# Patient Record
Sex: Female | Born: 1940 | Race: White | Hispanic: No | Marital: Married | Smoking: Never smoker
Health system: Southern US, Community
[De-identification: ages and names within clinical notes are randomized; demographics above are authoritative.]

## PROBLEM LIST (undated history)

## (undated) DIAGNOSIS — C50919 Malignant neoplasm of unspecified site of unspecified female breast: Secondary | ICD-10-CM

## (undated) DIAGNOSIS — I10 Essential (primary) hypertension: Secondary | ICD-10-CM

## (undated) DIAGNOSIS — E119 Type 2 diabetes mellitus without complications: Secondary | ICD-10-CM

## (undated) DIAGNOSIS — E785 Hyperlipidemia, unspecified: Secondary | ICD-10-CM

## (undated) HISTORY — DX: Type 2 diabetes mellitus without complications: E11.9

## (undated) HISTORY — DX: Essential (primary) hypertension: I10

## (undated) HISTORY — PX: MASTECTOMY: SHX3

## (undated) HISTORY — DX: Hyperlipidemia, unspecified: E78.5

## (undated) HISTORY — PX: TOTAL KNEE ARTHROPLASTY: SHX125

## (undated) HISTORY — DX: Malignant neoplasm of unspecified site of unspecified female breast: C50.919

---

## 1999-10-24 ENCOUNTER — Other Ambulatory Visit: Admission: RE | Admit: 1999-10-24 | Discharge: 1999-10-24 | Payer: Self-pay | Admitting: Family Medicine

## 2001-03-31 ENCOUNTER — Other Ambulatory Visit: Admission: RE | Admit: 2001-03-31 | Discharge: 2001-03-31 | Payer: Self-pay | Admitting: Family Medicine

## 2002-05-04 ENCOUNTER — Encounter: Admission: RE | Admit: 2002-05-04 | Discharge: 2002-05-04 | Payer: Self-pay | Admitting: Orthopedic Surgery

## 2002-05-04 ENCOUNTER — Encounter: Payer: Self-pay | Admitting: Orthopedic Surgery

## 2002-10-01 ENCOUNTER — Encounter: Admission: RE | Admit: 2002-10-01 | Discharge: 2002-10-01 | Payer: Self-pay | Admitting: Family Medicine

## 2002-10-01 ENCOUNTER — Encounter: Payer: Self-pay | Admitting: Family Medicine

## 2008-12-19 ENCOUNTER — Encounter: Payer: Self-pay | Admitting: Cardiology

## 2009-02-01 ENCOUNTER — Ambulatory Visit: Payer: Self-pay | Admitting: Cardiology

## 2009-03-14 ENCOUNTER — Encounter: Payer: Self-pay | Admitting: Cardiology

## 2009-10-03 ENCOUNTER — Encounter
Admission: RE | Admit: 2009-10-03 | Discharge: 2010-01-01 | Payer: Self-pay | Admitting: Physical Medicine and Rehabilitation

## 2010-02-19 DIAGNOSIS — I10 Essential (primary) hypertension: Secondary | ICD-10-CM

## 2010-02-19 DIAGNOSIS — E785 Hyperlipidemia, unspecified: Secondary | ICD-10-CM | POA: Insufficient documentation

## 2010-02-19 DIAGNOSIS — I152 Hypertension secondary to endocrine disorders: Secondary | ICD-10-CM | POA: Insufficient documentation

## 2010-02-19 DIAGNOSIS — E1159 Type 2 diabetes mellitus with other circulatory complications: Secondary | ICD-10-CM | POA: Insufficient documentation

## 2010-02-21 ENCOUNTER — Ambulatory Visit: Payer: Self-pay | Admitting: Cardiology

## 2010-03-06 ENCOUNTER — Encounter: Payer: Self-pay | Admitting: Cardiology

## 2010-03-09 ENCOUNTER — Encounter: Payer: Self-pay | Admitting: Cardiology

## 2010-10-16 NOTE — Assessment & Plan Note (Signed)
Summary: [facility]   Visit Type:  Follow-up Referring Provider:  Dr. Kenna Gilbert Primary Provider:  Belva Agee, NP  CC:  Jeanette Daniels op.  History of Present Illness: The patient presents for preoperative evaluation. She is due to have left knee replacement. She has a history of cardiovascular risk factors with high blood pressure and dyslipidemia. I saw her last year preoperatively prior to right knee replacement. She did well with this procedure. She has had no new symptoms since that time. Despite her knee problems she is active working daily and can do such things as climbing stairs and vacuuming. With this she does not get chest pressure, neck or arm discomfort. She does not have any shortness of breath and denies any PND or orthopnea. She has no palpitations, presyncope or syncope.  Current Medications (verified): 1)  Aromasin 25 Mg Tabs (Exemestane) .Marland Kitchen.. 1 By Mouth Daily 2)  Lipitor 20 Mg Tabs (Atorvastatin Calcium) .Marland Kitchen.. 1 By Mouth Dialy 3)  Metoprolol Tartrate 100 Mg Tabs (Metoprolol Tartrate) .Marland Kitchen.. 1 By Mouth Two Times A Day 4)  Sertraline Hcl 100 Mg Tabs (Sertraline Hcl) .Marland Kitchen.. 1 1/2 By Mouth Dialy 5)  Vitamin D 2000 Unit Caps (Cholecalciferol) .... 2 By Mouth Daily 6)  Zolpidem Tartrate 10 Mg Tabs (Zolpidem Tartrate) .Marland Kitchen.. 1 By Mouth  At Bedtime 7)  Hydrocodone-Acetaminophen 5-325 Mg Tabs (Hydrocodone-Acetaminophen) .Marland Kitchen.. 1 By Mouth As Needed 8)  Eql Stool Softener 100 Mg Caps (Docusate Sodium) .Marland Kitchen.. 1 By Mouth Daily  Allergies (verified): 1)  ! Celebrex  Past History:     Past Medical History: 1. Breast cancer status post chemotherapy, radiation and surgery. 2. Hyperlipidemia x16years. 3. Hypertension x16 years.  Past Surgical History:  Left mastectomy.   Right total knee replacement  Family History:  Noncontributory for early coronary artery disease.  Her father died at 4 of Alzheimer's.   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 She is married and has 2 children.  She never smoked cigarettes and does not drink alcohol.   Review of Systems       As stated in the HPI and negative for all other systems.   Vital Signs:  Patient profile:   70 year old female Height:      66 inches Weight:      220 pounds BMI:     35.64 Pulse rate:   65 / minute Resp:     16 per minute BP sitting:   144 / 78  (right arm)  Vitals Entered By: Marrion Coy, CNA (February 21, 2010 1:08 PM)  Physical Exam  General:  Well developed, well nourished, in no acute distress. Head:  normocephalic and atraumatic Eyes:  PERRLA/EOM intact; conjunctiva and lids normal. Neck:  Neck supple, no JVD. No masses, thyromegaly or abnormal cervical nodes. Chest Wall:  no deformities or breast masses noted Lungs:  Clear bilaterally to auscultation and percussion. Abdomen:  Bowel sounds positive; abdomen soft and non-tender without masses, organomegaly, or hernias noted. No hepatosplenomegaly. Msk:  Back normal, normal gait. Muscle strength and tone normal. Extremities:  No clubbing or cyanosis. Neurologic:  Alert and oriented x 3. Skin:  Intact without lesions or rashes. Cervical Nodes:  no significant adenopathy Axillary Nodes:  no significant adenopathy Inguinal Nodes:  no significant adenopathy Psych:  Normal affect.   Detailed Cardiovascular Exam  Neck    Carotids: Carotids full and equal bilaterally without bruits.      Neck Veins: Normal,  no JVD.    Heart    Inspection: no deformities or lifts noted.      Palpation: normal PMI with no thrills palpable.      Auscultation: regular rate and rhythm, S1, S2 without murmurs, rubs, gallops, or clicks.    Vascular    Abdominal Aorta: no palpable masses, pulsations, or audible bruits.      Femoral Pulses: normal femoral pulses bilaterally.      Pedal Pulses: normal pedal pulses bilaterally.      Radial Pulses: normal radial pulses bilaterally.      Peripheral Circulation:  no clubbing, cyanosis, or edema noted with normal capillary refill.     EKG  Procedure date:  02/21/2010  Findings:      Sinus rhythm, rate 65, axis rightward, nonspecific inferior T-wave inversion in lead V3 only  Impression & Recommendations:  Problem # 1:  PREOPERATIVE EXAMINATION (ICD-V72.84) The patient is at acceptable risk for the planned surgery based on ACC/AHA guidelines. She has no high risk features, a good functional level (greater than 5 METS) and is going for a surgery that is moderate risk at most from a cardiovascular standpoint.  Problem # 2:  HYPERTENSION (ICD-401.9) Her blood pressure is very slightly elevated and this is unusual. I reviewed recent blood pressures at another office visits and would suggest no change to her regimen is necessary.  Problem # 3:  HYPERLIPIDEMIA (ICD-272.4) I reviewed her lipid profile and she has an excellent ratio. She'll continue on the meds as listed. Her updated medication list for this problem includes:    Lipitor 20 Mg Tabs (Atorvastatin calcium) .Marland Kitchen... 1 by mouth dialy

## 2010-10-16 NOTE — Letter (Signed)
Summary: [facility]   [facility]   Imported By: Roderic Ovens 03/22/2010 13:00:49  _____________________________________________________________________  External Attachment:    Type:   Image     Comment:   External Document

## 2010-10-16 NOTE — Letter (Signed)
[facility]   Imported By: Marylou Mccoy 03/23/2010 12:42:05  _____________________________________________________________________  External Attachment:    Type:   Image     Comment:   External Document

## 2010-10-16 NOTE — Letter (Signed)
Summary: [facility]  [facility]   Imported By: Debby Freiberg 03/14/2010 14:46:24  _____________________________________________________________________  External Attachment:    Type:   Image     Comment:   External Document

## 2011-01-29 NOTE — Assessment & Plan Note (Signed)
[facility]                            CARDIOLOGY OFFICE NOTE   NAME:Figley, ARACELIE ADDIS                         MRN:          [id number]  DATE:02/01/2009                            DOB:          1940/11/06    PRIMARY CARE PHYSICIAN:  Belva Agee, NP   REASON FOR PRESENTATION:  Preoperative evaluation of the patient with  multiple cardiovascular risk factors.   HISTORY OF PRESENT ILLNESS:  The patient is a lovely 70 year old white  female who is to have right knee surgery.  She has no prior cardiac  history, though she does have cardiovascular risk factors.  She did have  a negative exercise treadmill test in 2001.  She has not had any  cardiovascular symptoms.  She is not able to exercise as much she used  to because of knee pain.  However, she goes up and down stairs.  She  pushes the vacuum (greater than 5 METS).  With this level of activity,  she denies any chest discomfort, neck or arm discomfort.  She has no  palpitations, presyncope, or syncope.  She has no PND or orthopnea.   PAST MEDICAL HISTORY:  1. Breast cancer status post chemotherapy, radiation and surgery.  2. Hyperlipidemia x15 years.  3. Hypertension x15 years.   PAST SURGICAL HISTORY:  Left mastectomy.   ALLERGIES:  None.   MEDICATIONS:  1. Toprol 100 mg b.i.d.  2. Lipitor 20 mg daily.  3. Mobic 10 mg daily.  4. Aromasin 25 mg daily.  5. Zoloft 25 mg daily.  6. Vitamin D.  7. Ambien 10 mg at bedtime.   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  She is married and has 2 children.  She never smoked  cigarettes and does not drink alcohol.   FAMILY HISTORY:  Noncontributory for early coronary artery disease.  Her  father died at 55 of Alzheimer's.   REVIEW OF SYSTEMS:  As stated in the HPI, positive for joint pains, mild  lower extremity edema.  Negative for all other systems.   PHYSICAL EXAMINATION:  GENERAL:  The patient is  pleasant and in no  distress.  VITAL SIGNS:  Blood pressure 120/72, heart rate 71 and regular, weight  220 pounds, body mass index 36.  HEENT:  Eyelids unremarkable.  Pupils equal, round, reactive to light.  Fundi not visualized.  Oral mucous unremarkable.  NECK:  No jugular venous distention at 45 degrees.  Carotid upstroke  brisk and symmetric.  No bruits, no thyromegaly.  LYMPHATICS:  No cervical, axillary, or inguinal adenopathy.  LUNGS:  Clear to auscultation bilaterally.  BACK:  No costovertebral angle tenderness.  CHEST:  Unremarkable.  HEART:  PMI not displaced or sustained.  S1 and S2 within normal limits.  No S3, no S4.  No clicks, no rubs, no murmurs.  ABDOMEN:  Flat, positive bowel sounds.  Normal in frequency and pitch.  No bruits, no rebound, no guarding.  No midline pulsatile mass.  No  hepatomegaly, no splenomegaly.  SKIN:  No rashes, no nodules.  EXTREMITIES:  Pulses 2+ throughout.  No edema, no cyanosis, no clubbing.  NEURO:  Oriented to person, place, and time.  Cranial nerves II through  XII are grossly intact, motor grossly intact.   EKG, sinus bradycardia, rate is 50, axis within normal limits, intervals  within normal limits, RSR prime in V1 and V2, no acute ST-wave change.   ASSESSMENT AND PLAN:  1. Preoperative clearance.  The patient has cardiovascular risk      factors.  However, she has no symptoms.  There are no high-risk      features according to the ACC/AHA guidelines.  She has a high      functional level.  The surgery that is planned has only moderate      risk from a cardiovascular standpoint.  Given this, no further      cardiovascular testing is suggested.  The patient can proceed with      surgery with the medications as listed.  2. Hypertension.  Blood pressure is well controlled and she will      continue the meds as listed.  3. Dyslipidemia.  She says this is well controlled.  I did review      previous labs with an LDL of 107 and HDL of  43.  She will remain on      the meds as listed.  4. Obesity.  She understands the need to lose weight with diet and      exercise.  Her body mass index does put her in the obese range.  5. Followup will be in this clinic as needed.     Rollene Rotunda, MD, [facility]  Electronically Signed    JH/MedQ  DD: 02/01/2009  DT: 02/02/2009  Job #: [id number]   cc:   Belva Agee, NP

## 2011-04-23 ENCOUNTER — Encounter: Payer: Self-pay | Admitting: Cardiology

## 2011-10-29 DIAGNOSIS — N281 Cyst of kidney, acquired: Secondary | ICD-10-CM | POA: Diagnosis not present

## 2011-10-29 DIAGNOSIS — IMO0001 Reserved for inherently not codable concepts without codable children: Secondary | ICD-10-CM | POA: Diagnosis not present

## 2011-10-29 DIAGNOSIS — Z96659 Presence of unspecified artificial knee joint: Secondary | ICD-10-CM | POA: Diagnosis not present

## 2011-10-29 DIAGNOSIS — G4700 Insomnia, unspecified: Secondary | ICD-10-CM | POA: Diagnosis not present

## 2011-10-29 DIAGNOSIS — C50919 Malignant neoplasm of unspecified site of unspecified female breast: Secondary | ICD-10-CM | POA: Diagnosis not present

## 2011-10-29 DIAGNOSIS — M1990 Unspecified osteoarthritis, unspecified site: Secondary | ICD-10-CM | POA: Diagnosis not present

## 2011-10-29 DIAGNOSIS — Z79899 Other long term (current) drug therapy: Secondary | ICD-10-CM | POA: Diagnosis not present

## 2011-10-29 DIAGNOSIS — E785 Hyperlipidemia, unspecified: Secondary | ICD-10-CM | POA: Diagnosis not present

## 2011-10-29 DIAGNOSIS — F329 Major depressive disorder, single episode, unspecified: Secondary | ICD-10-CM | POA: Diagnosis not present

## 2011-10-29 DIAGNOSIS — I10 Essential (primary) hypertension: Secondary | ICD-10-CM | POA: Diagnosis not present

## 2011-10-29 DIAGNOSIS — K5730 Diverticulosis of large intestine without perforation or abscess without bleeding: Secondary | ICD-10-CM | POA: Diagnosis not present

## 2011-12-09 DIAGNOSIS — Z09 Encounter for follow-up examination after completed treatment for conditions other than malignant neoplasm: Secondary | ICD-10-CM | POA: Diagnosis not present

## 2011-12-09 DIAGNOSIS — G4700 Insomnia, unspecified: Secondary | ICD-10-CM | POA: Diagnosis not present

## 2011-12-09 DIAGNOSIS — Z79811 Long term (current) use of aromatase inhibitors: Secondary | ICD-10-CM | POA: Diagnosis not present

## 2011-12-09 DIAGNOSIS — I10 Essential (primary) hypertension: Secondary | ICD-10-CM | POA: Diagnosis not present

## 2011-12-09 DIAGNOSIS — Z79899 Other long term (current) drug therapy: Secondary | ICD-10-CM | POA: Diagnosis not present

## 2011-12-09 DIAGNOSIS — M1990 Unspecified osteoarthritis, unspecified site: Secondary | ICD-10-CM | POA: Diagnosis not present

## 2011-12-09 DIAGNOSIS — E785 Hyperlipidemia, unspecified: Secondary | ICD-10-CM | POA: Diagnosis not present

## 2011-12-09 DIAGNOSIS — F329 Major depressive disorder, single episode, unspecified: Secondary | ICD-10-CM | POA: Diagnosis not present

## 2011-12-09 DIAGNOSIS — N281 Cyst of kidney, acquired: Secondary | ICD-10-CM | POA: Diagnosis not present

## 2011-12-09 DIAGNOSIS — Z853 Personal history of malignant neoplasm of breast: Secondary | ICD-10-CM | POA: Diagnosis not present

## 2011-12-09 DIAGNOSIS — IMO0001 Reserved for inherently not codable concepts without codable children: Secondary | ICD-10-CM | POA: Diagnosis not present

## 2011-12-09 DIAGNOSIS — Z96659 Presence of unspecified artificial knee joint: Secondary | ICD-10-CM | POA: Diagnosis not present

## 2011-12-09 DIAGNOSIS — K5732 Diverticulitis of large intestine without perforation or abscess without bleeding: Secondary | ICD-10-CM | POA: Diagnosis not present

## 2011-12-16 DIAGNOSIS — I10 Essential (primary) hypertension: Secondary | ICD-10-CM | POA: Diagnosis not present

## 2011-12-16 DIAGNOSIS — E785 Hyperlipidemia, unspecified: Secondary | ICD-10-CM | POA: Diagnosis not present

## 2012-01-07 DIAGNOSIS — IMO0001 Reserved for inherently not codable concepts without codable children: Secondary | ICD-10-CM | POA: Diagnosis not present

## 2012-01-20 DIAGNOSIS — Z452 Encounter for adjustment and management of vascular access device: Secondary | ICD-10-CM | POA: Diagnosis not present

## 2012-01-20 DIAGNOSIS — Z853 Personal history of malignant neoplasm of breast: Secondary | ICD-10-CM | POA: Diagnosis not present

## 2012-02-27 DIAGNOSIS — Z471 Aftercare following joint replacement surgery: Secondary | ICD-10-CM | POA: Diagnosis not present

## 2012-02-27 DIAGNOSIS — Z96659 Presence of unspecified artificial knee joint: Secondary | ICD-10-CM | POA: Diagnosis not present

## 2012-03-03 DIAGNOSIS — Z803 Family history of malignant neoplasm of breast: Secondary | ICD-10-CM | POA: Diagnosis not present

## 2012-03-03 DIAGNOSIS — Z09 Encounter for follow-up examination after completed treatment for conditions other than malignant neoplasm: Secondary | ICD-10-CM | POA: Diagnosis not present

## 2012-03-03 DIAGNOSIS — Z853 Personal history of malignant neoplasm of breast: Secondary | ICD-10-CM | POA: Diagnosis not present

## 2012-03-03 DIAGNOSIS — M1990 Unspecified osteoarthritis, unspecified site: Secondary | ICD-10-CM | POA: Diagnosis not present

## 2012-03-03 DIAGNOSIS — K5730 Diverticulosis of large intestine without perforation or abscess without bleeding: Secondary | ICD-10-CM | POA: Diagnosis not present

## 2012-03-03 DIAGNOSIS — Z170 Estrogen receptor positive status [ER+]: Secondary | ICD-10-CM | POA: Diagnosis not present

## 2012-03-03 DIAGNOSIS — Z79811 Long term (current) use of aromatase inhibitors: Secondary | ICD-10-CM | POA: Diagnosis not present

## 2012-03-03 DIAGNOSIS — E785 Hyperlipidemia, unspecified: Secondary | ICD-10-CM | POA: Diagnosis not present

## 2012-03-03 DIAGNOSIS — G4700 Insomnia, unspecified: Secondary | ICD-10-CM | POA: Diagnosis not present

## 2012-03-03 DIAGNOSIS — IMO0001 Reserved for inherently not codable concepts without codable children: Secondary | ICD-10-CM | POA: Diagnosis not present

## 2012-03-03 DIAGNOSIS — N281 Cyst of kidney, acquired: Secondary | ICD-10-CM | POA: Diagnosis not present

## 2012-03-03 DIAGNOSIS — I10 Essential (primary) hypertension: Secondary | ICD-10-CM | POA: Diagnosis not present

## 2012-03-03 DIAGNOSIS — F329 Major depressive disorder, single episode, unspecified: Secondary | ICD-10-CM | POA: Diagnosis not present

## 2012-03-03 DIAGNOSIS — Z9010 Acquired absence of unspecified breast and nipple: Secondary | ICD-10-CM | POA: Diagnosis not present

## 2012-04-07 DIAGNOSIS — I10 Essential (primary) hypertension: Secondary | ICD-10-CM | POA: Diagnosis not present

## 2012-04-07 DIAGNOSIS — E119 Type 2 diabetes mellitus without complications: Secondary | ICD-10-CM | POA: Diagnosis not present

## 2012-04-21 DIAGNOSIS — Z09 Encounter for follow-up examination after completed treatment for conditions other than malignant neoplasm: Secondary | ICD-10-CM | POA: Diagnosis not present

## 2012-04-21 DIAGNOSIS — Z79811 Long term (current) use of aromatase inhibitors: Secondary | ICD-10-CM | POA: Diagnosis not present

## 2012-04-21 DIAGNOSIS — E785 Hyperlipidemia, unspecified: Secondary | ICD-10-CM | POA: Diagnosis not present

## 2012-04-21 DIAGNOSIS — G4700 Insomnia, unspecified: Secondary | ICD-10-CM | POA: Diagnosis not present

## 2012-04-21 DIAGNOSIS — Z853 Personal history of malignant neoplasm of breast: Secondary | ICD-10-CM | POA: Diagnosis not present

## 2012-04-21 DIAGNOSIS — I10 Essential (primary) hypertension: Secondary | ICD-10-CM | POA: Diagnosis not present

## 2012-04-21 DIAGNOSIS — F329 Major depressive disorder, single episode, unspecified: Secondary | ICD-10-CM | POA: Diagnosis not present

## 2012-06-02 DIAGNOSIS — E785 Hyperlipidemia, unspecified: Secondary | ICD-10-CM | POA: Diagnosis not present

## 2012-06-02 DIAGNOSIS — G4700 Insomnia, unspecified: Secondary | ICD-10-CM | POA: Diagnosis not present

## 2012-06-02 DIAGNOSIS — Z803 Family history of malignant neoplasm of breast: Secondary | ICD-10-CM | POA: Diagnosis not present

## 2012-06-02 DIAGNOSIS — Z79811 Long term (current) use of aromatase inhibitors: Secondary | ICD-10-CM | POA: Diagnosis not present

## 2012-06-02 DIAGNOSIS — Z853 Personal history of malignant neoplasm of breast: Secondary | ICD-10-CM | POA: Diagnosis not present

## 2012-06-02 DIAGNOSIS — N281 Cyst of kidney, acquired: Secondary | ICD-10-CM | POA: Diagnosis not present

## 2012-06-02 DIAGNOSIS — Z09 Encounter for follow-up examination after completed treatment for conditions other than malignant neoplasm: Secondary | ICD-10-CM | POA: Diagnosis not present

## 2012-06-02 DIAGNOSIS — K5730 Diverticulosis of large intestine without perforation or abscess without bleeding: Secondary | ICD-10-CM | POA: Diagnosis not present

## 2012-06-02 DIAGNOSIS — I10 Essential (primary) hypertension: Secondary | ICD-10-CM | POA: Diagnosis not present

## 2012-06-02 DIAGNOSIS — IMO0001 Reserved for inherently not codable concepts without codable children: Secondary | ICD-10-CM | POA: Diagnosis not present

## 2012-06-02 DIAGNOSIS — M1990 Unspecified osteoarthritis, unspecified site: Secondary | ICD-10-CM | POA: Diagnosis not present

## 2012-07-07 DIAGNOSIS — Z79811 Long term (current) use of aromatase inhibitors: Secondary | ICD-10-CM | POA: Diagnosis not present

## 2012-07-07 DIAGNOSIS — E785 Hyperlipidemia, unspecified: Secondary | ICD-10-CM | POA: Diagnosis not present

## 2012-07-07 DIAGNOSIS — I10 Essential (primary) hypertension: Secondary | ICD-10-CM | POA: Diagnosis not present

## 2012-07-07 DIAGNOSIS — IMO0001 Reserved for inherently not codable concepts without codable children: Secondary | ICD-10-CM | POA: Diagnosis not present

## 2012-07-07 DIAGNOSIS — K5730 Diverticulosis of large intestine without perforation or abscess without bleeding: Secondary | ICD-10-CM | POA: Diagnosis not present

## 2012-07-07 DIAGNOSIS — M1990 Unspecified osteoarthritis, unspecified site: Secondary | ICD-10-CM | POA: Diagnosis not present

## 2012-07-07 DIAGNOSIS — Z853 Personal history of malignant neoplasm of breast: Secondary | ICD-10-CM | POA: Diagnosis not present

## 2012-07-07 DIAGNOSIS — Z9010 Acquired absence of unspecified breast and nipple: Secondary | ICD-10-CM | POA: Diagnosis not present

## 2012-07-07 DIAGNOSIS — Z09 Encounter for follow-up examination after completed treatment for conditions other than malignant neoplasm: Secondary | ICD-10-CM | POA: Diagnosis not present

## 2012-07-09 DIAGNOSIS — E119 Type 2 diabetes mellitus without complications: Secondary | ICD-10-CM | POA: Diagnosis not present

## 2012-10-22 DIAGNOSIS — Z9010 Acquired absence of unspecified breast and nipple: Secondary | ICD-10-CM | POA: Diagnosis not present

## 2012-10-22 DIAGNOSIS — Z79811 Long term (current) use of aromatase inhibitors: Secondary | ICD-10-CM | POA: Diagnosis not present

## 2012-10-22 DIAGNOSIS — N281 Cyst of kidney, acquired: Secondary | ICD-10-CM | POA: Diagnosis not present

## 2012-10-22 DIAGNOSIS — Z452 Encounter for adjustment and management of vascular access device: Secondary | ICD-10-CM | POA: Diagnosis not present

## 2012-10-22 DIAGNOSIS — Z9221 Personal history of antineoplastic chemotherapy: Secondary | ICD-10-CM | POA: Diagnosis not present

## 2012-10-22 DIAGNOSIS — Z09 Encounter for follow-up examination after completed treatment for conditions other than malignant neoplasm: Secondary | ICD-10-CM | POA: Diagnosis not present

## 2012-10-22 DIAGNOSIS — Z923 Personal history of irradiation: Secondary | ICD-10-CM | POA: Diagnosis not present

## 2012-10-22 DIAGNOSIS — G4700 Insomnia, unspecified: Secondary | ICD-10-CM | POA: Diagnosis not present

## 2012-10-22 DIAGNOSIS — Z853 Personal history of malignant neoplasm of breast: Secondary | ICD-10-CM | POA: Diagnosis not present

## 2012-12-03 DIAGNOSIS — Z853 Personal history of malignant neoplasm of breast: Secondary | ICD-10-CM | POA: Diagnosis not present

## 2012-12-03 DIAGNOSIS — I10 Essential (primary) hypertension: Secondary | ICD-10-CM | POA: Diagnosis not present

## 2012-12-03 DIAGNOSIS — M1990 Unspecified osteoarthritis, unspecified site: Secondary | ICD-10-CM | POA: Diagnosis not present

## 2012-12-03 DIAGNOSIS — Z79811 Long term (current) use of aromatase inhibitors: Secondary | ICD-10-CM | POA: Diagnosis not present

## 2012-12-03 DIAGNOSIS — IMO0001 Reserved for inherently not codable concepts without codable children: Secondary | ICD-10-CM | POA: Diagnosis not present

## 2012-12-03 DIAGNOSIS — K5730 Diverticulosis of large intestine without perforation or abscess without bleeding: Secondary | ICD-10-CM | POA: Diagnosis not present

## 2012-12-03 DIAGNOSIS — E785 Hyperlipidemia, unspecified: Secondary | ICD-10-CM | POA: Diagnosis not present

## 2012-12-03 DIAGNOSIS — Z09 Encounter for follow-up examination after completed treatment for conditions other than malignant neoplasm: Secondary | ICD-10-CM | POA: Diagnosis not present

## 2012-12-03 DIAGNOSIS — G4700 Insomnia, unspecified: Secondary | ICD-10-CM | POA: Diagnosis not present

## 2012-12-20 ENCOUNTER — Other Ambulatory Visit: Payer: Self-pay | Admitting: Physician Assistant

## 2013-01-13 DIAGNOSIS — Z79811 Long term (current) use of aromatase inhibitors: Secondary | ICD-10-CM | POA: Diagnosis not present

## 2013-01-13 DIAGNOSIS — C50919 Malignant neoplasm of unspecified site of unspecified female breast: Secondary | ICD-10-CM | POA: Diagnosis not present

## 2013-01-13 DIAGNOSIS — Z452 Encounter for adjustment and management of vascular access device: Secondary | ICD-10-CM | POA: Diagnosis not present

## 2013-02-24 DIAGNOSIS — Z803 Family history of malignant neoplasm of breast: Secondary | ICD-10-CM | POA: Diagnosis not present

## 2013-02-24 DIAGNOSIS — Z9010 Acquired absence of unspecified breast and nipple: Secondary | ICD-10-CM | POA: Diagnosis not present

## 2013-02-24 DIAGNOSIS — Z452 Encounter for adjustment and management of vascular access device: Secondary | ICD-10-CM | POA: Diagnosis not present

## 2013-02-24 DIAGNOSIS — Z170 Estrogen receptor positive status [ER+]: Secondary | ICD-10-CM | POA: Diagnosis not present

## 2013-02-24 DIAGNOSIS — Z9221 Personal history of antineoplastic chemotherapy: Secondary | ICD-10-CM | POA: Diagnosis not present

## 2013-02-24 DIAGNOSIS — Z79899 Other long term (current) drug therapy: Secondary | ICD-10-CM | POA: Diagnosis not present

## 2013-02-24 DIAGNOSIS — C50919 Malignant neoplasm of unspecified site of unspecified female breast: Secondary | ICD-10-CM | POA: Diagnosis not present

## 2013-02-24 DIAGNOSIS — Z79811 Long term (current) use of aromatase inhibitors: Secondary | ICD-10-CM | POA: Diagnosis not present

## 2013-02-24 DIAGNOSIS — Z923 Personal history of irradiation: Secondary | ICD-10-CM | POA: Diagnosis not present

## 2013-03-26 ENCOUNTER — Ambulatory Visit (INDEPENDENT_AMBULATORY_CARE_PROVIDER_SITE_OTHER): Payer: BC Managed Care – PPO | Admitting: Family Medicine

## 2013-03-26 ENCOUNTER — Encounter: Payer: Self-pay | Admitting: Family Medicine

## 2013-03-26 VITALS — BP 107/60 | HR 62 | Temp 98.1°F | Ht 67.0 in | Wt 210.4 lb

## 2013-03-26 DIAGNOSIS — J40 Bronchitis, not specified as acute or chronic: Secondary | ICD-10-CM | POA: Diagnosis not present

## 2013-03-26 MED ORDER — HYDROCOD POLST-CHLORPHEN POLST 10-8 MG/5ML PO LQCR: 5.0000 mL | Freq: Two times a day (BID) | ORAL | Status: DC | PRN

## 2013-03-26 MED ORDER — AZITHROMYCIN 250 MG PO TABS: ORAL_TABLET | ORAL | Status: DC

## 2013-03-26 NOTE — Progress Notes (Signed)
  Subjective:    Patient ID: Jeanette Daniels, female    DOB: October 04, 1940, 72 y.o.   MRN: [id number]  HPI URI Symptoms Onset: 7-10 day  Description: rhinorrhea, nasal congestion, cough, sinus pressure  Modifying factors:  none  Symptoms Nasal discharge: yes Fever: mild Sore throat: no Cough: yes Wheezing: no Ear pain: no GI symptoms: no Sick contacts: yes  Red Flags  Stiff neck: no Dyspnea: no Rash: no Swallowing difficulty: no  Sinusitis Risk Factors Headache/face pain: mild Double sickening: no tooth pain: no  Allergy Risk Factors Sneezing: mild Itchy scratchy throat: no Seasonal symptoms: no  Flu Risk Factors Headache: no muscle aches: no severe fatigue: no     Review of Systems  All other systems reviewed and are negative.       Objective:   Physical Exam  Constitutional: She appears well-developed and well-nourished.  HENT:  Head: Normocephalic and atraumatic.  Right Ear: External ear normal.  Left Ear: External ear normal.  +nasal erythema, rhinorrhea bilaterally, + post oropharyngeal erythema    Eyes: Conjunctivae are normal. Pupils are equal, round, and reactive to light.  Neck: Normal range of motion. Neck supple.  Cardiovascular: Normal rate, regular rhythm and normal heart sounds.   Pulmonary/Chest: Effort normal and breath sounds normal. She has no wheezes.  Abdominal: Soft.  Musculoskeletal: Normal range of motion.  Lymphadenopathy:    She has no cervical adenopathy.  Neurological: She is alert.  Skin: Skin is warm.          Assessment & Plan:  Bronchitis - Plan: azithromycin (ZITHROMAX) 250 MG tablet, chlorpheniramine-HYDROcodone (TUSSIONEX PENNKINETIC ER) 10-8 MG/5ML LQCR   Likely viral process. Will place on course of Z-Pak for atypical coverage. Tussionex for cough. Discuss overall infectious and respiratory red flags. Followup as needed.

## 2013-04-06 ENCOUNTER — Encounter: Payer: Self-pay | Admitting: General Practice

## 2013-04-06 ENCOUNTER — Ambulatory Visit (INDEPENDENT_AMBULATORY_CARE_PROVIDER_SITE_OTHER): Payer: BC Managed Care – PPO | Admitting: General Practice

## 2013-04-06 VITALS — BP 109/70 | HR 67 | Temp 98.5°F | Ht 67.0 in | Wt 208.5 lb

## 2013-04-06 DIAGNOSIS — E785 Hyperlipidemia, unspecified: Secondary | ICD-10-CM | POA: Diagnosis not present

## 2013-04-06 DIAGNOSIS — Z09 Encounter for follow-up examination after completed treatment for conditions other than malignant neoplasm: Secondary | ICD-10-CM | POA: Diagnosis not present

## 2013-04-06 DIAGNOSIS — E119 Type 2 diabetes mellitus without complications: Secondary | ICD-10-CM

## 2013-04-06 DIAGNOSIS — I10 Essential (primary) hypertension: Secondary | ICD-10-CM

## 2013-04-06 DIAGNOSIS — F32A Depression, unspecified: Secondary | ICD-10-CM

## 2013-04-06 DIAGNOSIS — F329 Major depressive disorder, single episode, unspecified: Secondary | ICD-10-CM

## 2013-04-06 LAB — POCT CBC
HCT, POC: 41.7 % (ref 37.7–47.9)
MCH, POC: 29.8 pg (ref 27–31.2)
MCHC: 33 g/dL (ref 31.8–35.4)
MPV: 6.9 fL (ref 0–99.8)
POC Granulocyte: 8.1 — AB (ref 2–6.9)
POC LYMPH PERCENT: 18.2 %L (ref 10–50)
RDW, POC: 12.7 %
WBC: 10.1 10*3/uL (ref 4.6–10.2)

## 2013-04-06 LAB — COMPLETE METABOLIC PANEL WITH GFR
ALT: 10 U/L (ref 0–35)
BUN: 14 mg/dL (ref 6–23)
CO2: 28 mEq/L (ref 19–32)
Calcium: 10.2 mg/dL (ref 8.4–10.5)
Chloride: 104 mEq/L (ref 96–112)
Creat: 0.81 mg/dL (ref 0.50–1.10)
GFR, Est African American: 85 mL/min
Total Bilirubin: 0.7 mg/dL (ref 0.3–1.2)

## 2013-04-06 LAB — POCT GLYCOSYLATED HEMOGLOBIN (HGB A1C): Hemoglobin A1C: 5.7

## 2013-04-06 MED ORDER — SERTRALINE HCL 100 MG PO TABS: 150.0000 mg | ORAL_TABLET | Freq: Every day | ORAL | Status: DC

## 2013-04-06 MED ORDER — ATORVASTATIN CALCIUM 40 MG PO TABS: 40.0000 mg | ORAL_TABLET | Freq: Every day | ORAL | Status: DC

## 2013-04-06 MED ORDER — LOSARTAN POTASSIUM 100 MG PO TABS: 100.0000 mg | ORAL_TABLET | Freq: Every day | ORAL | Status: DC

## 2013-04-06 MED ORDER — METOPROLOL TARTRATE 100 MG PO TABS: 100.0000 mg | ORAL_TABLET | Freq: Two times a day (BID) | ORAL | Status: DC

## 2013-04-06 MED ORDER — METFORMIN HCL 500 MG PO TABS: 500.0000 mg | ORAL_TABLET | Freq: Two times a day (BID) | ORAL | Status: DC

## 2013-04-06 NOTE — Progress Notes (Signed)
  Subjective:    Patient ID: Jeanette Daniels, female    DOB: Aug 21, 1941, 72 y.o.   MRN: [id number]  HPI Patient presents today for chronic health condition follow up. She reports having a "flare up of diverticulitis over the weekend but thinks it has resolved. She has a history of hypertension, diabetes, depression, hyperlipidemia, and chronic knee pain (refilled by ortho specialist). She reports taking medications as prescribed.     Review of Systems  Constitutional: Negative for fever and chills.  HENT: Negative for neck pain and neck stiffness.   Respiratory: Negative for cough and chest tightness.   Cardiovascular: Negative for chest pain and palpitations.  Gastrointestinal: Negative for nausea, vomiting, abdominal pain, diarrhea and blood in stool.  Genitourinary: Negative for dysuria, hematuria and difficulty urinating.  Musculoskeletal: Negative for back pain.  Neurological: Negative for dizziness, weakness and headaches.       Objective:   Physical Exam  Constitutional: She appears well-developed and well-nourished.  HENT:  Head: Normocephalic and atraumatic.  Right Ear: External ear normal.  Left Ear: External ear normal.  Mouth/Throat: Oropharynx is clear and moist.  Eyes: EOM are normal. Pupils are equal, round, and reactive to light.  Neck: Normal range of motion. Neck supple. No thyromegaly present.  Cardiovascular: Normal rate, regular rhythm and normal heart sounds.   Pulmonary/Chest: Effort normal and breath sounds normal.  Abdominal: Soft. Bowel sounds are normal. She exhibits no distension. There is no tenderness.  Lymphadenopathy:    She has no cervical adenopathy.  Neurological: She is alert.  Skin: Skin is warm and dry.  Psychiatric: She has a normal mood and affect.          Assessment & Plan:  1. Essential hypertension, benign - metoprolol (LOPRESSOR) 100 MG tablet; Take 1 tablet (100 mg total) by mouth 2 (two) times daily.  Dispense: 60 tablet; Refill:  3 - losartan (COZAAR) 100 MG tablet; Take 1 tablet (100 mg total) by mouth daily.  Dispense: 30 tablet; Refill: 3  2. Other and unspecified hyperlipidemia - atorvastatin (LIPITOR) 40 MG tablet; Take 1 tablet (40 mg total) by mouth daily.  Dispense: 30 tablet; Refill: 3 - NMR Lipoprofile with Lipids  3. Follow-up exam, 3-6 months since previous exam - POCT CBC - COMPLETE METABOLIC PANEL WITH GFR  4. Diabetes - POCT glycosylated hemoglobin (Hb A1C)  5. Depression - sertraline (ZOLOFT) 100 MG tablet; Take 1.5 tablets (150 mg total) by mouth daily.  Dispense: 60 tablet; Refill: 3 -Continue all current medications Labs pending F/u in 3 months and sooner if symptoms develop Discussed exercise and healthy eating  -Patient verbalized understanding -Coralie Keens, FNP-C

## 2013-04-06 NOTE — Patient Instructions (Addendum)
Hypertriglyceridemia  Diet for High blood levels of Triglycerides Most fats in food are triglycerides. Triglycerides in your blood are stored as fat in your body. High levels of triglycerides in your blood may put you at a greater risk for heart disease and stroke.  Normal triglyceride levels are less than 150 mg/dL. Borderline high levels are 150-199 mg/dl. High levels are 200 - 499 mg/dL, and very high triglyceride levels are greater than 500 mg/dL. The decision to treat high triglycerides is generally based on the level. For people with borderline or high triglyceride levels, treatment includes weight loss and exercise. Drugs are recommended for people with very high triglyceride levels. Many people who need treatment for high triglyceride levels have metabolic syndrome. This syndrome is a collection of disorders that often include: insulin resistance, high blood pressure, blood clotting problems, high cholesterol and triglycerides. TESTING PROCEDURE FOR TRIGLYCERIDES  You should not eat 4 hours before getting your triglycerides measured. The normal range of triglycerides is between 10 and 250 milligrams per deciliter (mg/dl). Some people may have extreme levels (1000 or above), but your triglyceride level may be too high if it is above 150 mg/dl, depending on what other risk factors you have for heart disease.  People with high blood triglycerides may also have high blood cholesterol levels. If you have high blood cholesterol as well as high blood triglycerides, your risk for heart disease is probably greater than if you only had high triglycerides. High blood cholesterol is one of the main risk factors for heart disease. CHANGING YOUR DIET  Your weight can affect your blood triglyceride level. If you are more than 20% above your ideal body weight, you may be able to lower your blood triglycerides by losing weight. Eating less and exercising regularly is the best way to combat this. Fat provides more  calories than any other food. The best way to lose weight is to eat less fat. Only 30% of your total calories should come from fat. Less than 7% of your diet should come from saturated fat. A diet low in fat and saturated fat is the same as a diet to decrease blood cholesterol. By eating a diet lower in fat, you may lose weight, lower your blood cholesterol, and lower your blood triglyceride level.  Eating a diet low in fat, especially saturated fat, may also help you lower your blood triglyceride level. Ask your dietitian to help you figure how much fat you can eat based on the number of calories your caregiver has prescribed for you.  Exercise, in addition to helping with weight loss may also help lower triglyceride levels.   Alcohol can increase blood triglycerides. You may need to stop drinking alcoholic beverages.  Too much carbohydrate in your diet may also increase your blood triglycerides. Some complex carbohydrates are necessary in your diet. These may include bread, rice, potatoes, other starchy vegetables and cereals.  Reduce "simple" carbohydrates. These may include pure sugars, candy, honey, and jelly without losing other nutrients. If you have the kind of high blood triglycerides that is affected by the amount of carbohydrates in your diet, you will need to eat less sugar and less high-sugar foods. Your caregiver can help you with this.  Adding 2-4 grams of fish oil (EPA+ DHA) may also help lower triglycerides. Speak with your caregiver before adding any supplements to your regimen. Following the Diet  Maintain your ideal weight. Your caregivers can help you with a diet. Generally, eating less food and getting more   exercise will help you lose weight. Joining a weight control group may also help. Ask your caregivers for a good weight control group in your area.  Eat low-fat foods instead of high-fat foods. This can help you lose weight too.  These foods are lower in fat. Eat MORE of these:    Dried beans, peas, and lentils.  Egg whites.  Low-fat cottage cheese.  Fish.  Lean cuts of meat, such as round, sirloin, rump, and flank (cut extra fat off meat you fix).  Whole grain breads, cereals and pasta.  Skim and nonfat dry milk.  Low-fat yogurt.  Poultry without the skin.  Cheese made with skim or part-skim milk, such as mozzarella, parmesan, farmers', ricotta, or pot cheese. These are higher fat foods. Eat LESS of these:   Whole milk and foods made from whole milk, such as American, blue, cheddar, monterey jack, and swiss cheese  High-fat meats, such as luncheon meats, sausages, knockwurst, bratwurst, hot dogs, ribs, corned beef, ground pork, and regular ground beef.  Fried foods. Limit saturated fats in your diet. Substituting unsaturated fat for saturated fat may decrease your blood triglyceride level. You will need to read package labels to know which products contain saturated fats.  These foods are high in saturated fat. Eat LESS of these:   Fried pork skins.  Whole milk.  Skin and fat from poultry.  Palm oil.  Butter.  Shortening.  Cream cheese.  Bacon.  Margarines and baked goods made from listed oils.  Vegetable shortenings.  Chitterlings.  Fat from meats.  Coconut oil.  Palm kernel oil.  Lard.  Cream.  Sour cream.  Fatback.  Coffee whiteners and non-dairy creamers made with these oils.  Cheese made from whole milk. Use unsaturated fats (both polyunsaturated and monounsaturated) moderately. Remember, even though unsaturated fats are better than saturated fats; you still want a diet low in total fat.  These foods are high in unsaturated fat:   Canola oil.  Sunflower oil.  Mayonnaise.  Almonds.  Peanuts.  Pine nuts.  Margarines made with these oils.  Safflower oil.  Olive oil.  Avocados.  Cashews.  Peanut butter.  Sunflower seeds.  Soybean oil.  Peanut  oil.  Olives.  Pecans.  Walnuts.  Pumpkin seeds. Avoid sugar and other high-sugar foods. This will decrease carbohydrates without decreasing other nutrients. Sugar in your food goes rapidly to your blood. When there is excess sugar in your blood, your liver may use it to make more triglycerides. Sugar also contains calories without other important nutrients.  Eat LESS of these:   Sugar, brown sugar, powdered sugar, jam, jelly, preserves, honey, syrup, molasses, pies, candy, cakes, cookies, frosting, pastries, colas, soft drinks, punches, fruit drinks, and regular gelatin.  Avoid alcohol. Alcohol, even more than sugar, may increase blood triglycerides. In addition, alcohol is high in calories and low in nutrients. Ask for sparkling water, or a diet soft drink instead of an alcoholic beverage. Suggestions for planning and preparing meals   Bake, broil, grill or roast meats instead of frying.  Remove fat from meats and skin from poultry before cooking.  Add spices, herbs, lemon juice or vinegar to vegetables instead of salt, rich sauces or gravies.  Use a non-stick skillet without fat or use no-stick sprays.  Cool and refrigerate stews and broth. Then remove the hardened fat floating on the surface before serving.  Refrigerate meat drippings and skim off fat to make low-fat gravies.  Serve more fish.  Use less butter,   margarine and other high-fat spreads on bread or vegetables.  Use skim or reconstituted non-fat dry milk for cooking.  Cook with low-fat cheeses.  Substitute low-fat yogurt or cottage cheese for all or part of the sour cream in recipes for sauces, dips or congealed salads.  Use half yogurt/half mayonnaise in salad recipes.  Substitute evaporated skim milk for cream. Evaporated skim milk or reconstituted non-fat dry milk can be whipped and substituted for whipped cream in certain recipes.  Choose fresh fruits for dessert instead of high-fat foods such as pies or  cakes. Fruits are naturally low in fat. When Dining Out   Order low-fat appetizers such as fruit or vegetable juice, pasta with vegetables or tomato sauce.  Select clear, rather than cream soups.  Ask that dressings and gravies be served on the side. Then use less of them.  Order foods that are baked, broiled, poached, steamed, stir-fried, or roasted.  Ask for margarine instead of butter, and use only a small amount.  Drink sparkling water, unsweetened tea or coffee, or diet soft drinks instead of alcohol or other sweet beverages. QUESTIONS AND ANSWERS ABOUT OTHER FATS IN THE BLOOD: SATURATED FAT, TRANS FAT, AND CHOLESTEROL What is trans fat? Trans fat is a type of fat that is formed when vegetable oil is hardened through a process called hydrogenation. This process helps makes foods more solid, gives them shape, and prolongs their shelf life. Trans fats are also called hydrogenated or partially hydrogenated oils.  What do saturated fat, trans fat, and cholesterol in foods have to do with heart disease? Saturated fat, trans fat, and cholesterol in the diet all raise the level of LDL "bad" cholesterol in the blood. The higher the LDL cholesterol, the greater the risk for coronary heart disease (CHD). Saturated fat and trans fat raise LDL similarly.  What foods contain saturated fat, trans fat, and cholesterol? High amounts of saturated fat are found in animal products, such as fatty cuts of meat, chicken skin, and full-fat dairy products like butter, whole milk, cream, and cheese, and in tropical vegetable oils such as palm, palm kernel, and coconut oil. Trans fat is found in some of the same foods as saturated fat, such as vegetable shortening, some margarines (especially hard or stick margarine), crackers, cookies, baked goods, fried foods, salad dressings, and other processed foods made with partially hydrogenated vegetable oils. Small amounts of trans fat also occur naturally in some animal  products, such as milk products, beef, and lamb. Foods high in cholesterol include liver, other organ meats, egg yolks, shrimp, and full-fat dairy products. How can I use the new food label to make heart-healthy food choices? Check the Nutrition Facts panel of the food label. Choose foods lower in saturated fat, trans fat, and cholesterol. For saturated fat and cholesterol, you can also use the Percent Daily Value (%DV): 5% DV or less is low, and 20% DV or more is high. (There is no %DV for trans fat.) Use the Nutrition Facts panel to choose foods low in saturated fat and cholesterol, and if the trans fat is not listed, read the ingredients and limit products that list shortening or hydrogenated or partially hydrogenated vegetable oil, which tend to be high in trans fat. POINTS TO REMEMBER:   Discuss your risk for heart disease with your caregivers, and take steps to reduce risk factors.  Change your diet. Choose foods that are low in saturated fat, trans fat, and cholesterol.  Add exercise to your daily routine if   it is not already being done. Participate in physical activity of moderate intensity, like brisk walking, for at least 30 minutes on most, and preferably all days of the week. No time? Break the 30 minutes into three, 10-minute segments during the day.  Stop smoking. If you do smoke, contact your caregiver to discuss ways in which they can help you quit.  Do not use street drugs.  Maintain a normal weight.  Maintain a healthy blood pressure.  Keep up with your blood work for checking the fats in your blood as directed by your caregiver. Document Released: 06/20/2004 Document Revised: 03/03/2012 Document Reviewed: 01/16/2009 [employer] Patient Information 2014 [employer].  

## 2013-04-07 LAB — NMR LIPOPROFILE WITH LIPIDS
HDL Particle Number: 34.6 umol/L (ref >=30.5)
HDL Size: 8.6 nm — ABNORMAL LOW (ref >=9.2)
HDL-C: 49 mg/dL (ref >=40)
LDL (calc): 79 mg/dL (ref <100)
LDL Particle Number: 1023 nmol/L — ABNORMAL HIGH (ref <1000)
LDL Size: 20.5 nm — ABNORMAL LOW (ref >20.5)
LP-IR Score: 57 — ABNORMAL HIGH (ref <=45)
Small LDL Particle Number: 563 nmol/L — ABNORMAL HIGH (ref <=527)
VLDL Size: 48.8 nm — ABNORMAL HIGH (ref <=46.6)

## 2013-04-12 DIAGNOSIS — C50919 Malignant neoplasm of unspecified site of unspecified female breast: Secondary | ICD-10-CM | POA: Diagnosis not present

## 2013-05-16 ENCOUNTER — Other Ambulatory Visit: Payer: Self-pay | Admitting: Nurse Practitioner

## 2013-05-18 ENCOUNTER — Telehealth: Payer: Self-pay | Admitting: General Practice

## 2013-05-20 NOTE — Telephone Encounter (Signed)
Pt wants extended release BP med and refill for strips.

## 2013-05-21 NOTE — Telephone Encounter (Signed)
Mae to address 

## 2013-05-21 NOTE — Telephone Encounter (Signed)
Pt called and [facility] called and pt was to be taking metorolol xl 100 bid and confirmed at pharm., this was fixed at pharm and in epic-jhb

## 2013-05-24 ENCOUNTER — Other Ambulatory Visit: Payer: Self-pay | Admitting: General Practice

## 2013-05-24 NOTE — Telephone Encounter (Signed)
Spoke with patient and medication request has been addressed.

## 2013-06-01 DIAGNOSIS — C50119 Malignant neoplasm of central portion of unspecified female breast: Secondary | ICD-10-CM | POA: Diagnosis not present

## 2013-06-01 DIAGNOSIS — Z9221 Personal history of antineoplastic chemotherapy: Secondary | ICD-10-CM | POA: Diagnosis not present

## 2013-06-01 DIAGNOSIS — Z452 Encounter for adjustment and management of vascular access device: Secondary | ICD-10-CM | POA: Diagnosis not present

## 2013-06-01 DIAGNOSIS — C773 Secondary and unspecified malignant neoplasm of axilla and upper limb lymph nodes: Secondary | ICD-10-CM | POA: Diagnosis not present

## 2013-06-01 DIAGNOSIS — Z923 Personal history of irradiation: Secondary | ICD-10-CM | POA: Diagnosis not present

## 2013-06-01 DIAGNOSIS — Z9010 Acquired absence of unspecified breast and nipple: Secondary | ICD-10-CM | POA: Diagnosis not present

## 2013-06-01 DIAGNOSIS — C50919 Malignant neoplasm of unspecified site of unspecified female breast: Secondary | ICD-10-CM | POA: Diagnosis not present

## 2013-06-01 DIAGNOSIS — Z170 Estrogen receptor positive status [ER+]: Secondary | ICD-10-CM | POA: Diagnosis not present

## 2013-06-23 ENCOUNTER — Ambulatory Visit (INDEPENDENT_AMBULATORY_CARE_PROVIDER_SITE_OTHER): Payer: BC Managed Care – PPO

## 2013-06-23 DIAGNOSIS — Z23 Encounter for immunization: Secondary | ICD-10-CM | POA: Diagnosis not present

## 2013-07-09 ENCOUNTER — Encounter: Payer: Self-pay | Admitting: General Practice

## 2013-07-09 ENCOUNTER — Ambulatory Visit (INDEPENDENT_AMBULATORY_CARE_PROVIDER_SITE_OTHER): Payer: BC Managed Care – PPO | Admitting: General Practice

## 2013-07-09 VITALS — BP 116/66 | HR 64 | Temp 98.7°F | Ht 67.0 in | Wt 213.0 lb

## 2013-07-09 DIAGNOSIS — E119 Type 2 diabetes mellitus without complications: Secondary | ICD-10-CM

## 2013-07-09 DIAGNOSIS — F32A Depression, unspecified: Secondary | ICD-10-CM

## 2013-07-09 DIAGNOSIS — E785 Hyperlipidemia, unspecified: Secondary | ICD-10-CM

## 2013-07-09 DIAGNOSIS — F329 Major depressive disorder, single episode, unspecified: Secondary | ICD-10-CM

## 2013-07-09 DIAGNOSIS — I10 Essential (primary) hypertension: Secondary | ICD-10-CM

## 2013-07-09 LAB — POCT GLYCOSYLATED HEMOGLOBIN (HGB A1C): Hemoglobin A1C: 5.6

## 2013-07-09 MED ORDER — METFORMIN HCL 500 MG PO TABS: 500.0000 mg | ORAL_TABLET | Freq: Every day | ORAL | Status: DC

## 2013-07-09 MED ORDER — SERTRALINE HCL 100 MG PO TABS: 150.0000 mg | ORAL_TABLET | Freq: Every day | ORAL | Status: DC

## 2013-07-09 MED ORDER — LOSARTAN POTASSIUM 100 MG PO TABS: 100.0000 mg | ORAL_TABLET | Freq: Every day | ORAL | Status: DC

## 2013-07-09 MED ORDER — ATORVASTATIN CALCIUM 40 MG PO TABS: 40.0000 mg | ORAL_TABLET | Freq: Every day | ORAL | Status: DC

## 2013-07-09 NOTE — Patient Instructions (Signed)

## 2013-07-09 NOTE — Progress Notes (Signed)
  Subjective:    Patient ID: Jeanette Daniels, female    DOB: April 21, 1941, 72 y.o.   MRN: [id number]  HPI Patient presents today for chronic health visit. She has a history of hypertension, diabetes, hyperlipidemia, depression, and insomnia. Reports mood swings and inability to sleep well controlled with medications. She reports taking medications as prescribed. Reports having knee surgery in 2011 and periodic knee pain. Reports checking blood pressure weekly and range 120's/60-70's. Reports eating healthy.     Review of Systems  Constitutional: Negative for fever and chills.  Respiratory: Negative for cough, chest tightness, shortness of breath and wheezing.   Cardiovascular: Negative for chest pain and palpitations.  Gastrointestinal: Negative for abdominal pain, diarrhea, constipation and blood in stool.  Genitourinary: Negative for dysuria, hematuria and difficulty urinating.  Musculoskeletal: Negative for back pain, neck pain and neck stiffness.       Knee pain  Neurological: Negative for dizziness, weakness and headaches.       Objective:   Physical Exam  Constitutional: She is oriented to person, place, and time. She appears well-developed and well-nourished.  HENT:  Head: Normocephalic and atraumatic.  Right Ear: External ear normal.  Left Ear: External ear normal.  Nose: Nose normal.  Mouth/Throat: Oropharynx is clear and moist.  Eyes: EOM are normal. Pupils are equal, round, and reactive to light.  Neck: Normal range of motion. Neck supple. No thyromegaly present.  Cardiovascular: Normal rate, regular rhythm and normal heart sounds.   Pulmonary/Chest: Effort normal and breath sounds normal. No respiratory distress. She exhibits no tenderness.  Abdominal: Soft. Bowel sounds are normal. She exhibits no distension. There is no tenderness.  Lymphadenopathy:    She has no cervical adenopathy.  Neurological: She is alert and oriented to person, place, and time.  Skin: Skin is warm  and dry.  Psychiatric: She has a normal mood and affect.          Assessment & Plan:  1. Hypertension  - CMP14+EGFR - losartan (COZAAR) 100 MG tablet; Take 1 tablet (100 mg total) by mouth daily.  Dispense: 30 tablet; Refill: 3  2. Hyperlipidemia  - Lipid panel  3. Diabetes  - POCT glycosylated hemoglobin (Hb A1C) - metFORMIN (GLUCOPHAGE) 500 MG tablet; Take 1 tablet (500 mg total) by mouth daily with breakfast.  Dispense: 30 tablet; Refill: 3  4. Other and unspecified hyperlipidemia  - atorvastatin (LIPITOR) 40 MG tablet; Take 1 tablet (40 mg total) by mouth daily.  Dispense: 30 tablet; Refill: 3  5. Depression  - sertraline (ZOLOFT) 100 MG tablet; Take 1.5 tablets (150 mg total) by mouth daily.  Dispense: 60 tablet; Refill: 3   -Continue all current medications Labs pending F/u in 3 months and prn Discussed exercise and diet  Patient verbalized understanding Coralie Keens, FNP-C

## 2013-07-10 LAB — LIPID PANEL
Cholesterol, Total: 149 mg/dL (ref 100–199)
HDL: 49 mg/dL (ref >39)
VLDL Cholesterol Cal: 15 mg/dL (ref 5–40)

## 2013-07-10 LAB — CMP14+EGFR
ALT: 10 IU/L (ref 0–32)
AST: 8 IU/L (ref 0–40)
Albumin/Globulin Ratio: 1.9 (ref 1.1–2.5)
CO2: 26 mmol/L (ref 18–29)
Calcium: 9.9 mg/dL (ref 8.6–10.2)
Chloride: 102 mmol/L (ref 97–108)
Glucose: 128 mg/dL — ABNORMAL HIGH (ref 65–99)
Potassium: 4.7 mmol/L (ref 3.5–5.2)
Sodium: 142 mmol/L (ref 134–144)
Total Protein: 6.9 g/dL (ref 6.0–8.5)

## 2013-07-12 ENCOUNTER — Ambulatory Visit: Payer: BC Managed Care – PPO | Admitting: General Practice

## 2013-07-13 ENCOUNTER — Encounter: Payer: Self-pay | Admitting: General Practice

## 2013-07-16 DIAGNOSIS — Z452 Encounter for adjustment and management of vascular access device: Secondary | ICD-10-CM | POA: Diagnosis not present

## 2013-07-16 DIAGNOSIS — C50919 Malignant neoplasm of unspecified site of unspecified female breast: Secondary | ICD-10-CM | POA: Diagnosis not present

## 2013-07-20 ENCOUNTER — Telehealth: Payer: Self-pay | Admitting: General Practice

## 2013-07-20 DIAGNOSIS — F32A Depression, unspecified: Secondary | ICD-10-CM

## 2013-07-20 DIAGNOSIS — F329 Major depressive disorder, single episode, unspecified: Secondary | ICD-10-CM

## 2013-07-22 MED ORDER — SERTRALINE HCL 100 MG PO TABS: 150.0000 mg | ORAL_TABLET | Freq: Every day | ORAL | Status: DC

## 2013-07-22 NOTE — Telephone Encounter (Signed)
Zoloft written for 60 instead of 45 and she had to pay double the copay. Reordered for 45 per month.

## 2013-08-26 DIAGNOSIS — Z923 Personal history of irradiation: Secondary | ICD-10-CM | POA: Diagnosis not present

## 2013-08-26 DIAGNOSIS — Z9221 Personal history of antineoplastic chemotherapy: Secondary | ICD-10-CM | POA: Diagnosis not present

## 2013-08-26 DIAGNOSIS — Z170 Estrogen receptor positive status [ER+]: Secondary | ICD-10-CM | POA: Diagnosis not present

## 2013-08-26 DIAGNOSIS — Z79811 Long term (current) use of aromatase inhibitors: Secondary | ICD-10-CM | POA: Diagnosis not present

## 2013-08-26 DIAGNOSIS — Z452 Encounter for adjustment and management of vascular access device: Secondary | ICD-10-CM | POA: Diagnosis not present

## 2013-08-26 DIAGNOSIS — Z9010 Acquired absence of unspecified breast and nipple: Secondary | ICD-10-CM | POA: Diagnosis not present

## 2013-08-26 DIAGNOSIS — C773 Secondary and unspecified malignant neoplasm of axilla and upper limb lymph nodes: Secondary | ICD-10-CM | POA: Diagnosis not present

## 2013-08-26 DIAGNOSIS — C50119 Malignant neoplasm of central portion of unspecified female breast: Secondary | ICD-10-CM | POA: Diagnosis not present

## 2013-09-07 ENCOUNTER — Telehealth: Payer: Self-pay | Admitting: General Practice

## 2013-09-07 NOTE — Telephone Encounter (Signed)
PT WANTED TO BE SEEN FOR FEVER AND BODY ACHES OFFERED PT APPT WITH DR Modesto Charon, PT REFUSED APPT STATED SHE WOULD GO SOMEWHERE ELSE

## 2013-09-17 ENCOUNTER — Other Ambulatory Visit: Payer: Self-pay | Admitting: General Practice

## 2013-10-13 DIAGNOSIS — Z452 Encounter for adjustment and management of vascular access device: Secondary | ICD-10-CM | POA: Diagnosis not present

## 2013-10-13 DIAGNOSIS — Z9010 Acquired absence of unspecified breast and nipple: Secondary | ICD-10-CM | POA: Diagnosis not present

## 2013-10-13 DIAGNOSIS — Z9221 Personal history of antineoplastic chemotherapy: Secondary | ICD-10-CM | POA: Diagnosis not present

## 2013-10-13 DIAGNOSIS — Z923 Personal history of irradiation: Secondary | ICD-10-CM | POA: Diagnosis not present

## 2013-10-13 DIAGNOSIS — M25569 Pain in unspecified knee: Secondary | ICD-10-CM | POA: Diagnosis not present

## 2013-10-13 DIAGNOSIS — Z170 Estrogen receptor positive status [ER+]: Secondary | ICD-10-CM | POA: Diagnosis not present

## 2013-10-13 DIAGNOSIS — Z79811 Long term (current) use of aromatase inhibitors: Secondary | ICD-10-CM | POA: Diagnosis not present

## 2013-10-13 DIAGNOSIS — C50119 Malignant neoplasm of central portion of unspecified female breast: Secondary | ICD-10-CM | POA: Diagnosis not present

## 2013-10-13 DIAGNOSIS — C773 Secondary and unspecified malignant neoplasm of axilla and upper limb lymph nodes: Secondary | ICD-10-CM | POA: Diagnosis not present

## 2013-11-01 ENCOUNTER — Ambulatory Visit (INDEPENDENT_AMBULATORY_CARE_PROVIDER_SITE_OTHER): Payer: BC Managed Care – PPO | Admitting: Family Medicine

## 2013-11-01 ENCOUNTER — Encounter: Payer: Self-pay | Admitting: Family Medicine

## 2013-11-01 VITALS — BP 124/69 | HR 72 | Temp 98.2°F | Ht 67.0 in | Wt 217.2 lb

## 2013-11-01 DIAGNOSIS — J069 Acute upper respiratory infection, unspecified: Secondary | ICD-10-CM

## 2013-11-01 MED ORDER — BENZONATATE 100 MG PO CAPS: 100.0000 mg | ORAL_CAPSULE | Freq: Three times a day (TID) | ORAL | Status: DC | PRN

## 2013-11-01 MED ORDER — AZITHROMYCIN 250 MG PO TABS: ORAL_TABLET | ORAL | Status: DC

## 2013-11-01 NOTE — Patient Instructions (Signed)

## 2013-11-01 NOTE — Progress Notes (Signed)
   Subjective:    Patient ID: CHIOMA MUKHERJEE, female    DOB: [birth year], 73 y.o.   MRN: [id number]  HPI This 73 y.o. female presents for evaluation of uri sx's.   Review of Systems No chest pain, SOB, HA, dizziness, vision change, N/V, diarrhea, constipation, dysuria, urinary urgency or frequency, myalgias, arthralgias or rash.     Objective:   Physical Exam Vital signs noted  Well developed well nourished female.  HEENT - Head atraumatic Normocephalic                Eyes - PERRLA, Conjuctiva - clear Sclera- Clear EOMI                Ears - EAC's Wnl TM's Wnl Gross Hearing WNL                Nose - Nares patent                 Throat - oropharanx wnl Respiratory - Lungs CTA bilateral Cardiac - RRR S1 and S2 without murmur GI - Abdomen soft Nontender and bowel sounds active x 4 Extremities - No edema. Neuro - Grossly intact.       Assessment & Plan:  URI (upper respiratory infection) - Plan: azithromycin (ZITHROMAX) 250 MG tablet, benzonatate (TESSALON PERLES) 100 MG capsule. Push po fluids, rest, tylenol and motrin otc prn as directed for fever, arthralgias, and myalgias.  Follow up prn if sx's continue or persist.  Lysbeth Penner FNP

## 2013-11-15 ENCOUNTER — Other Ambulatory Visit: Payer: Self-pay | Admitting: General Practice

## 2013-11-23 ENCOUNTER — Ambulatory Visit (INDEPENDENT_AMBULATORY_CARE_PROVIDER_SITE_OTHER): Payer: BC Managed Care – PPO | Admitting: Family Medicine

## 2013-11-23 VITALS — BP 122/77 | HR 63 | Temp 99.1°F | Ht 67.0 in | Wt 214.6 lb

## 2013-11-23 DIAGNOSIS — J209 Acute bronchitis, unspecified: Secondary | ICD-10-CM

## 2013-11-23 MED ORDER — AZITHROMYCIN 250 MG PO TABS: ORAL_TABLET | ORAL | Status: DC

## 2013-11-23 MED ORDER — TRIAMCINOLONE ACETONIDE 40 MG/ML IJ SUSP: 60.0000 mg | Freq: Once | INTRAMUSCULAR | Status: DC

## 2013-11-23 MED ORDER — HYDROCODONE-HOMATROPINE 5-1.5 MG/5ML PO SYRP: 5.0000 mL | ORAL_SOLUTION | Freq: Three times a day (TID) | ORAL | Status: DC | PRN

## 2013-11-23 NOTE — Progress Notes (Signed)
   Subjective:    Patient ID: Jeanette Daniels, female    DOB: Jun 02, 1941, 73 y.o.   MRN: [id number]  HPI This 73 y.o. female presents for evaluation of cough and uri sx's for over a week.   Review of Systems No chest pain, SOB, HA, dizziness, vision change, N/V, diarrhea, constipation, dysuria, urinary urgency or frequency, myalgias, arthralgias or rash.     Objective:   Physical Exam Vital signs noted  Well developed well nourished female.  HEENT - Head atraumatic Normocephalic                Eyes - PERRLA, Conjuctiva - clear Sclera- Clear EOMI                Ears - EAC's Wnl TM's Wnl Gross Hearing WNL                Nose - Nares patent                 Throat - oropharanx wnl Respiratory - Lungs CTA bilateral Cardiac - RRR S1 and S2 without murmur GI - Abdomen soft Nontender and bowel sounds active x 4 Extremities - No edema. Neuro - Grossly intact.       Assessment & Plan:  Acute bronchitis - Plan: azithromycin (ZITHROMAX) 250 MG tablet, HYDROcodone-homatropine (HYCODAN) 5-1.5 MG/5ML syrup, triamcinolone acetonide (KENALOG-40) injection 60 mg  Push po fluids, rest, tylenol and motrin otc prn as directed for fever, arthralgias, and myalgias.  Follow up prn if sx's continue or persist.  Lysbeth Penner FNP

## 2013-11-30 DIAGNOSIS — C773 Secondary and unspecified malignant neoplasm of axilla and upper limb lymph nodes: Secondary | ICD-10-CM | POA: Diagnosis not present

## 2013-11-30 DIAGNOSIS — Z79811 Long term (current) use of aromatase inhibitors: Secondary | ICD-10-CM | POA: Diagnosis not present

## 2013-11-30 DIAGNOSIS — Z9221 Personal history of antineoplastic chemotherapy: Secondary | ICD-10-CM | POA: Diagnosis not present

## 2013-11-30 DIAGNOSIS — Z923 Personal history of irradiation: Secondary | ICD-10-CM | POA: Diagnosis not present

## 2013-11-30 DIAGNOSIS — C50119 Malignant neoplasm of central portion of unspecified female breast: Secondary | ICD-10-CM | POA: Diagnosis not present

## 2013-11-30 DIAGNOSIS — Z9010 Acquired absence of unspecified breast and nipple: Secondary | ICD-10-CM | POA: Diagnosis not present

## 2013-11-30 DIAGNOSIS — Z452 Encounter for adjustment and management of vascular access device: Secondary | ICD-10-CM | POA: Diagnosis not present

## 2013-11-30 DIAGNOSIS — Z79899 Other long term (current) drug therapy: Secondary | ICD-10-CM | POA: Diagnosis not present

## 2013-11-30 DIAGNOSIS — Z170 Estrogen receptor positive status [ER+]: Secondary | ICD-10-CM | POA: Diagnosis not present

## 2013-12-01 ENCOUNTER — Other Ambulatory Visit: Payer: Self-pay | Admitting: General Practice

## 2013-12-13 ENCOUNTER — Other Ambulatory Visit: Payer: Self-pay | Admitting: General Practice

## 2013-12-15 LAB — HM MAMMOGRAPHY

## 2014-01-03 ENCOUNTER — Other Ambulatory Visit: Payer: Self-pay | Admitting: General Practice

## 2014-01-10 DIAGNOSIS — C773 Secondary and unspecified malignant neoplasm of axilla and upper limb lymph nodes: Secondary | ICD-10-CM | POA: Diagnosis not present

## 2014-01-10 DIAGNOSIS — Z452 Encounter for adjustment and management of vascular access device: Secondary | ICD-10-CM | POA: Diagnosis not present

## 2014-01-10 DIAGNOSIS — C50119 Malignant neoplasm of central portion of unspecified female breast: Secondary | ICD-10-CM | POA: Diagnosis not present

## 2014-01-11 ENCOUNTER — Encounter: Payer: Self-pay | Admitting: Family Medicine

## 2014-01-11 ENCOUNTER — Other Ambulatory Visit: Payer: Self-pay | Admitting: General Practice

## 2014-01-11 ENCOUNTER — Ambulatory Visit (INDEPENDENT_AMBULATORY_CARE_PROVIDER_SITE_OTHER): Payer: BC Managed Care – PPO | Admitting: Family Medicine

## 2014-01-11 VITALS — BP 139/85 | HR 60 | Temp 98.5°F | Ht 67.0 in | Wt 215.0 lb

## 2014-01-11 DIAGNOSIS — E119 Type 2 diabetes mellitus without complications: Secondary | ICD-10-CM | POA: Diagnosis not present

## 2014-01-11 NOTE — Progress Notes (Signed)
   Subjective:    Patient ID: Jeanette Daniels, female    DOB: April 17, 1941, 73 y.o.   MRN: [id number]  HPI This 73 y.o. female presents for evaluation of hypertension, hyperlipidemia, and DM. She is having some HS cramps.   Review of Systems C/o hs cramps No chest pain, SOB, HA, dizziness, vision change, N/V, diarrhea, constipation, dysuria, urinary urgency or frequency, myalgias, arthralgias or rash.     Objective:   Physical Exam  Vital signs noted  Well developed well nourished female.  HEENT - Head atraumatic Normocephalic                Eyes - PERRLA, Conjuctiva - clear Sclera- Clear EOMI                Ears - EAC's Wnl TM's Wnl Gross Hearing WNL                Nose - Nares patent                 Throat - oropharanx wnl Respiratory - Lungs CTA bilateral Cardiac - RRR S1 and S2 without murmur GI - Abdomen soft Nontender and bowel sounds active x 4 Extremities - No edema. Neuro - Grossly intact.      Assessment & Plan:  Diabetes - Plan: POCT CBC, CMP14+EGFR, POCT glycosylated hemoglobin (Hb A1C)  HS cramps - Take tonic water at HS.  Follow up in 3 months  Lysbeth Penner FNP

## 2014-01-12 ENCOUNTER — Other Ambulatory Visit: Payer: BC Managed Care – PPO

## 2014-01-12 LAB — POCT CBC
Granulocyte percent: 72.6 %G (ref 37–80)
HCT, POC: 41.7 % (ref 37.7–47.9)
Hemoglobin: 13.4 g/dL (ref 12.2–16.2)
Lymph, poc: 1.6 (ref 0.6–3.4)
MCH, POC: 29.8 pg (ref 27–31.2)
MCHC: 32.2 g/dL (ref 31.8–35.4)
MCV: 92.7 fL (ref 80–97)
MPV: 7.7 fL (ref 0–99.8)
POC Granulocyte: 4.6 (ref 2–6.9)
POC LYMPH PERCENT: 24.5 %L (ref 10–50)
Platelet Count, POC: 182 10*3/uL (ref 142–424)
RBC: 4.5 M/uL (ref 4.04–5.48)
RDW, POC: 14 %
WBC: 6.4 10*3/uL (ref 4.6–10.2)

## 2014-01-12 LAB — POCT GLYCOSYLATED HEMOGLOBIN (HGB A1C): Hemoglobin A1C: 5.9

## 2014-01-13 LAB — CMP14+EGFR
ALT: 14 IU/L (ref 0–32)
AST: 15 IU/L (ref 0–40)
Albumin/Globulin Ratio: 1.9 (ref 1.1–2.5)
Albumin: 4.3 g/dL (ref 3.5–4.8)
Alkaline Phosphatase: 73 IU/L (ref 39–117)
BUN/Creatinine Ratio: 18 (ref 11–26)
BUN: 14 mg/dL (ref 8–27)
CO2: 26 mmol/L (ref 18–29)
Calcium: 9.8 mg/dL (ref 8.7–10.3)
Chloride: 102 mmol/L (ref 97–108)
Creatinine, Ser: 0.8 mg/dL (ref 0.57–1.00)
GFR calc Af Amer: 86 mL/min/{1.73_m2} (ref >59)
GFR calc non Af Amer: 74 mL/min/{1.73_m2} (ref >59)
Globulin, Total: 2.3 g/dL (ref 1.5–4.5)
Glucose: 138 mg/dL — ABNORMAL HIGH (ref 65–99)
Potassium: 4.6 mmol/L (ref 3.5–5.2)
Sodium: 140 mmol/L (ref 134–144)
Total Bilirubin: 0.5 mg/dL (ref 0.0–1.2)
Total Protein: 6.6 g/dL (ref 6.0–8.5)

## 2014-01-21 ENCOUNTER — Telehealth: Payer: Self-pay | Admitting: *Deleted

## 2014-01-21 NOTE — Telephone Encounter (Signed)
Pt notified of lab results Verbalizes understanding 

## 2014-02-14 ENCOUNTER — Other Ambulatory Visit: Payer: Self-pay | Admitting: Family Medicine

## 2014-02-22 DIAGNOSIS — Z452 Encounter for adjustment and management of vascular access device: Secondary | ICD-10-CM | POA: Diagnosis not present

## 2014-02-22 DIAGNOSIS — C50919 Malignant neoplasm of unspecified site of unspecified female breast: Secondary | ICD-10-CM | POA: Diagnosis not present

## 2014-03-04 ENCOUNTER — Other Ambulatory Visit: Payer: Self-pay | Admitting: Family Medicine

## 2014-03-04 ENCOUNTER — Other Ambulatory Visit: Payer: Self-pay | Admitting: General Practice

## 2014-03-07 ENCOUNTER — Telehealth: Payer: Self-pay | Admitting: Family Medicine

## 2014-03-07 NOTE — Telephone Encounter (Signed)
error 

## 2014-03-08 ENCOUNTER — Other Ambulatory Visit: Payer: Self-pay | Admitting: Family Medicine

## 2014-03-28 DIAGNOSIS — M25569 Pain in unspecified knee: Secondary | ICD-10-CM | POA: Diagnosis not present

## 2014-04-05 DIAGNOSIS — Z452 Encounter for adjustment and management of vascular access device: Secondary | ICD-10-CM | POA: Diagnosis not present

## 2014-04-05 DIAGNOSIS — C50919 Malignant neoplasm of unspecified site of unspecified female breast: Secondary | ICD-10-CM | POA: Diagnosis not present

## 2014-04-07 ENCOUNTER — Encounter: Payer: Self-pay | Admitting: Family Medicine

## 2014-04-07 ENCOUNTER — Ambulatory Visit (INDEPENDENT_AMBULATORY_CARE_PROVIDER_SITE_OTHER): Payer: BC Managed Care – PPO | Admitting: Family Medicine

## 2014-04-07 VITALS — HR 62 | Temp 98.4°F | Ht 67.0 in | Wt 211.2 lb

## 2014-04-07 DIAGNOSIS — L989 Disorder of the skin and subcutaneous tissue, unspecified: Secondary | ICD-10-CM | POA: Diagnosis not present

## 2014-04-07 NOTE — Progress Notes (Signed)
   Subjective:    Patient ID: Jeanette Daniels, female    DOB: 1940-10-25, 73 y.o.   MRN: [id number]  HPI This 73 y.o. female presents for evaluation of hypertension and hyperlipidemia.  She has hx of t2Dm And she states her fsbs are running under 120 fasting in the am.  She has a skin lesion on her right Elbow she would like to have checked out.   Review of Systems C/o skin lesion right elbow.   No chest pain, SOB, HA, dizziness, vision change, N/V, diarrhea, constipation, dysuria, urinary urgency or frequency, myalgias, arthralgias or rash.  Objective:   Physical Exam Vital signs noted  Well developed well nourished female.  HEENT - Head atraumatic Normocephalic                Eyes - PERRLA, Conjuctiva - clear Sclera- Clear EOMI                Ears - EAC's Wnl TM's Wnl Gross Hearing WNL                 Throat - oropharanx wnl Respiratory - Lungs CTA bilateral Cardiac - RRR S1 and S2 without murmur GI - Abdomen soft Nontender and bowel sounds active x 4 Extremities - No edema. Neuro - Grossly intact. Skin - Skin lesion right elbow consistent with veruca wart or sk and histofreeze with liquid nitrogen x 15 seconds x 3 accomplished     Assessment & Plan:  Skin lesion Liquid nitrogen freeze x 3 and if not gone follow up in a week.  Discussed with patient that this will turn into a blister.  Follow up prn  DM - Controlled get labs next visit.  HTN - Controlled and continue current regimen.  Lysbeth Penner FNP

## 2014-04-10 ENCOUNTER — Other Ambulatory Visit: Payer: Self-pay | Admitting: Family Medicine

## 2014-05-02 ENCOUNTER — Telehealth: Payer: Self-pay | Admitting: Family Medicine

## 2014-05-02 NOTE — Telephone Encounter (Signed)
Patient aware she is able to get the prevnar vaccine

## 2014-05-09 ENCOUNTER — Other Ambulatory Visit: Payer: Self-pay | Admitting: Family Medicine

## 2014-05-18 DIAGNOSIS — Z853 Personal history of malignant neoplasm of breast: Secondary | ICD-10-CM | POA: Diagnosis not present

## 2014-05-18 DIAGNOSIS — Z170 Estrogen receptor positive status [ER+]: Secondary | ICD-10-CM | POA: Diagnosis not present

## 2014-05-18 DIAGNOSIS — Z9010 Acquired absence of unspecified breast and nipple: Secondary | ICD-10-CM | POA: Diagnosis not present

## 2014-05-18 DIAGNOSIS — Z79899 Other long term (current) drug therapy: Secondary | ICD-10-CM | POA: Diagnosis not present

## 2014-05-18 DIAGNOSIS — Z09 Encounter for follow-up examination after completed treatment for conditions other than malignant neoplasm: Secondary | ICD-10-CM | POA: Diagnosis not present

## 2014-05-24 ENCOUNTER — Ambulatory Visit (INDEPENDENT_AMBULATORY_CARE_PROVIDER_SITE_OTHER): Payer: BC Managed Care – PPO

## 2014-05-24 DIAGNOSIS — Z23 Encounter for immunization: Secondary | ICD-10-CM | POA: Diagnosis not present

## 2014-05-30 ENCOUNTER — Other Ambulatory Visit: Payer: Self-pay | Admitting: Family Medicine

## 2014-05-31 NOTE — Telephone Encounter (Signed)
Last AIC- 5.9 on 01/12/14. Has appt in Oct.

## 2014-06-12 ENCOUNTER — Other Ambulatory Visit: Payer: Self-pay | Admitting: Family Medicine

## 2014-06-29 DIAGNOSIS — C50912 Malignant neoplasm of unspecified site of left female breast: Secondary | ICD-10-CM | POA: Diagnosis not present

## 2014-07-08 ENCOUNTER — Encounter: Payer: Self-pay | Admitting: Family Medicine

## 2014-07-08 ENCOUNTER — Ambulatory Visit (INDEPENDENT_AMBULATORY_CARE_PROVIDER_SITE_OTHER): Payer: BC Managed Care – PPO | Admitting: Family Medicine

## 2014-07-08 VITALS — BP 123/74 | HR 67 | Temp 97.5°F | Ht 67.0 in | Wt 214.0 lb

## 2014-07-08 DIAGNOSIS — E1110 Type 2 diabetes mellitus with ketoacidosis without coma: Secondary | ICD-10-CM

## 2014-07-08 DIAGNOSIS — I10 Essential (primary) hypertension: Secondary | ICD-10-CM

## 2014-07-08 DIAGNOSIS — E1310 Other specified diabetes mellitus with ketoacidosis without coma: Secondary | ICD-10-CM

## 2014-07-08 DIAGNOSIS — E785 Hyperlipidemia, unspecified: Secondary | ICD-10-CM

## 2014-07-08 DIAGNOSIS — Z23 Encounter for immunization: Secondary | ICD-10-CM

## 2014-07-08 LAB — POCT CBC
Granulocyte percent: 72.1 %G (ref 37–80)
HCT, POC: 39.4 % (ref 37.7–47.9)
Hemoglobin: 13.1 g/dL (ref 12.2–16.2)
Lymph, poc: 1.6 (ref 0.6–3.4)
MCH, POC: 30.8 pg (ref 27–31.2)
MCHC: 33.2 g/dL (ref 31.8–35.4)
MCV: 93 fL (ref 80–97)
MPV: 7.6 fL (ref 0–99.8)
POC Granulocyte: 4.5 (ref 2–6.9)
POC LYMPH PERCENT: 25.1 %L (ref 10–50)
Platelet Count, POC: 189 10*3/uL (ref 142–424)
RBC: 4.2 M/uL (ref 4.04–5.48)
RDW, POC: 12.9 %
WBC: 6.3 10*3/uL (ref 4.6–10.2)

## 2014-07-08 LAB — POCT GLYCOSYLATED HEMOGLOBIN (HGB A1C): Hemoglobin A1C: 6

## 2014-07-08 MED ORDER — METFORMIN HCL 500 MG PO TABS: 500.0000 mg | ORAL_TABLET | Freq: Two times a day (BID) | ORAL | Status: DC

## 2014-07-08 MED ORDER — ATORVASTATIN CALCIUM 40 MG PO TABS: 40.0000 mg | ORAL_TABLET | Freq: Every day | ORAL | Status: DC

## 2014-07-08 MED ORDER — METOPROLOL SUCCINATE ER 100 MG PO TB24: ORAL_TABLET | ORAL | Status: DC

## 2014-07-08 NOTE — Progress Notes (Signed)
   Subjective:    Patient ID: Jeanette Daniels, female    DOB: Apr 13, 1941, 73 y.o.   MRN: [id number]  HPI Patient is here for diabetes visit.  She is due for labs and needs refills.  Review of Systems  Constitutional: Negative for fever.  HENT: Negative for ear pain.   Eyes: Negative for discharge.  Respiratory: Negative for cough.   Cardiovascular: Negative for chest pain.  Gastrointestinal: Negative for abdominal distention.  Endocrine: Negative for polyuria.  Genitourinary: Negative for difficulty urinating.  Musculoskeletal: Negative for gait problem and neck pain.  Skin: Negative for color change and rash.  Neurological: Negative for speech difficulty and headaches.  Psychiatric/Behavioral: Negative for agitation.       Objective:    BP 123/74  Pulse 67  Temp(Src) 97.5 F (36.4 C) (Oral)  Ht $R'5\' 7"'ED$  (1.702 m)  Wt 214 lb (97.07 kg)  BMI 33.51 kg/m2 Physical Exam  Constitutional: She is oriented to person, place, and time. She appears well-developed and well-nourished.  HENT:  Head: Normocephalic and atraumatic.  Mouth/Throat: Oropharynx is clear and moist.  Eyes: Pupils are equal, round, and reactive to light.  Neck: Normal range of motion. Neck supple.  Cardiovascular: Normal rate and regular rhythm.   No murmur heard. Pulmonary/Chest: Effort normal and breath sounds normal.  Abdominal: Soft. Bowel sounds are normal. There is no tenderness.  Neurological: She is alert and oriented to person, place, and time.  Skin: Skin is warm and dry.  Psychiatric: She has a normal mood and affect.          Assessment & Plan:     ICD-9-CM ICD-10-CM   1. DM (diabetes mellitus) type 2, uncontrolled, with ketoacidosis 250.12 E13.10 metFORMIN (GLUCOPHAGE) 500 MG tablet     POCT glycosylated hemoglobin (Hb A1C)  2. Essential hypertension 401.9 I10 metoprolol succinate (TOPROL-XL) 100 MG 24 hr tablet     POCT CBC     CMP14+EGFR     Lipid panel     Vit D  25 hydroxy (rtn  osteoporosis monitoring)  3. Hyperlipidemia 272.4 E78.5 atorvastatin (LIPITOR) 40 MG tablet     No Follow-up on file.  Lysbeth Penner FNP

## 2014-07-09 LAB — CMP14+EGFR
ALT: 17 IU/L (ref 0–32)
AST: 16 IU/L (ref 0–40)
Albumin/Globulin Ratio: 1.7 (ref 1.1–2.5)
Albumin: 4.2 g/dL (ref 3.5–4.8)
Alkaline Phosphatase: 70 IU/L (ref 39–117)
BUN/Creatinine Ratio: 21 (ref 11–26)
BUN: 18 mg/dL (ref 8–27)
CO2: 24 mmol/L (ref 18–29)
Calcium: 9.5 mg/dL (ref 8.7–10.3)
Chloride: 101 mmol/L (ref 97–108)
Creatinine, Ser: 0.87 mg/dL (ref 0.57–1.00)
GFR calc Af Amer: 77 mL/min/{1.73_m2} (ref >59)
GFR calc non Af Amer: 67 mL/min/{1.73_m2} (ref >59)
Globulin, Total: 2.5 g/dL (ref 1.5–4.5)
Glucose: 122 mg/dL — ABNORMAL HIGH (ref 65–99)
Potassium: 4.1 mmol/L (ref 3.5–5.2)
Sodium: 139 mmol/L (ref 134–144)
Total Bilirubin: 0.3 mg/dL (ref 0.0–1.2)
Total Protein: 6.7 g/dL (ref 6.0–8.5)

## 2014-07-09 LAB — LIPID PANEL
Chol/HDL Ratio: 3 ratio units (ref 0.0–4.4)
Cholesterol, Total: 143 mg/dL (ref 100–199)
HDL: 48 mg/dL (ref >39)
LDL Calculated: 82 mg/dL (ref 0–99)
Triglycerides: 65 mg/dL (ref 0–149)
VLDL Cholesterol Cal: 13 mg/dL (ref 5–40)

## 2014-07-09 LAB — VITAMIN D 25 HYDROXY (VIT D DEFICIENCY, FRACTURES): Vit D, 25-Hydroxy: 38.4 ng/mL (ref 30.0–100.0)

## 2014-07-11 ENCOUNTER — Other Ambulatory Visit: Payer: Self-pay | Admitting: Family Medicine

## 2014-07-11 DIAGNOSIS — Z23 Encounter for immunization: Secondary | ICD-10-CM | POA: Diagnosis not present

## 2014-07-11 NOTE — Telephone Encounter (Signed)
Message copied by Waverly Ferrari on Mon Jul 11, 2014  6:36 PM ------      Message from: Lysbeth Penner      Created: Mon Jul 11, 2014  1:58 PM       Labs ok ------

## 2014-08-01 ENCOUNTER — Other Ambulatory Visit: Payer: Self-pay | Admitting: General Practice

## 2014-08-04 ENCOUNTER — Telehealth: Payer: Self-pay | Admitting: Family Medicine

## 2014-08-04 NOTE — Telephone Encounter (Signed)
Patient dentist suggested that she call her family physician to see if we could given her something to help with the pain at bedtime. She states during the day it is manageable but at night it is very uncomfortable and has a hard time sleeping.

## 2014-08-05 NOTE — Telephone Encounter (Signed)
Patient aware.

## 2014-08-05 NOTE — Telephone Encounter (Signed)
Please review and advise.

## 2014-08-05 NOTE — Telephone Encounter (Signed)
She needs to call her dentist and have him provide a rx for pain medicine since he is doing a procedure for her and is responsible for this.

## 2014-08-10 DIAGNOSIS — C50912 Malignant neoplasm of unspecified site of left female breast: Secondary | ICD-10-CM | POA: Diagnosis not present

## 2014-08-10 DIAGNOSIS — Z452 Encounter for adjustment and management of vascular access device: Secondary | ICD-10-CM | POA: Diagnosis not present

## 2014-09-15 DIAGNOSIS — Z029 Encounter for administrative examinations, unspecified: Secondary | ICD-10-CM

## 2014-09-21 DIAGNOSIS — Z452 Encounter for adjustment and management of vascular access device: Secondary | ICD-10-CM | POA: Diagnosis not present

## 2014-09-21 DIAGNOSIS — C50912 Malignant neoplasm of unspecified site of left female breast: Secondary | ICD-10-CM | POA: Diagnosis not present

## 2014-10-24 ENCOUNTER — Other Ambulatory Visit: Payer: Self-pay | Admitting: Family Medicine

## 2014-11-04 DIAGNOSIS — Z452 Encounter for adjustment and management of vascular access device: Secondary | ICD-10-CM | POA: Diagnosis not present

## 2014-11-04 DIAGNOSIS — C50912 Malignant neoplasm of unspecified site of left female breast: Secondary | ICD-10-CM | POA: Diagnosis not present

## 2014-11-25 ENCOUNTER — Other Ambulatory Visit: Payer: Self-pay | Admitting: Family Medicine

## 2014-12-16 DIAGNOSIS — M8580 Other specified disorders of bone density and structure, unspecified site: Secondary | ICD-10-CM | POA: Diagnosis not present

## 2015-01-05 DIAGNOSIS — M25569 Pain in unspecified knee: Secondary | ICD-10-CM | POA: Diagnosis not present

## 2015-01-05 DIAGNOSIS — Z471 Aftercare following joint replacement surgery: Secondary | ICD-10-CM | POA: Diagnosis not present

## 2015-01-05 DIAGNOSIS — Z96653 Presence of artificial knee joint, bilateral: Secondary | ICD-10-CM | POA: Diagnosis not present

## 2015-01-09 ENCOUNTER — Encounter: Payer: Self-pay | Admitting: Family

## 2015-01-09 ENCOUNTER — Other Ambulatory Visit: Payer: Self-pay | Admitting: Family Medicine

## 2015-01-09 ENCOUNTER — Ambulatory Visit (INDEPENDENT_AMBULATORY_CARE_PROVIDER_SITE_OTHER): Payer: BC Managed Care – PPO | Admitting: Family

## 2015-01-09 VITALS — BP 131/76 | HR 64 | Temp 97.7°F | Ht 67.0 in | Wt 215.8 lb

## 2015-01-09 DIAGNOSIS — E785 Hyperlipidemia, unspecified: Secondary | ICD-10-CM

## 2015-01-09 DIAGNOSIS — F411 Generalized anxiety disorder: Secondary | ICD-10-CM | POA: Insufficient documentation

## 2015-01-09 DIAGNOSIS — G4700 Insomnia, unspecified: Secondary | ICD-10-CM

## 2015-01-09 DIAGNOSIS — E119 Type 2 diabetes mellitus without complications: Secondary | ICD-10-CM

## 2015-01-09 DIAGNOSIS — E559 Vitamin D deficiency, unspecified: Secondary | ICD-10-CM

## 2015-01-09 DIAGNOSIS — I10 Essential (primary) hypertension: Secondary | ICD-10-CM

## 2015-01-09 LAB — POCT GLYCOSYLATED HEMOGLOBIN (HGB A1C): Hemoglobin A1C: 6.1

## 2015-01-09 NOTE — Patient Instructions (Signed)

## 2015-01-09 NOTE — Progress Notes (Signed)
Subjective:    Patient ID: Jeanette Daniels, female    DOB: 12/02/1940, 74 y.o.   MRN: [id number]  Diabetes She presents for her follow-up diabetic visit. She has type 2 diabetes mellitus. Her disease course has been stable. Pertinent negatives for hypoglycemia include no confusion, headaches, mood changes, nervousness/anxiousness or pallor. Pertinent negatives for diabetes include no blurred vision, no foot paresthesias, no foot ulcerations and no visual change. Pertinent negatives for hypoglycemia complications include no blackouts and no hospitalization. Symptoms are stable. Pertinent negatives for diabetic complications include no autonomic neuropathy, CVA, heart disease, nephropathy or peripheral neuropathy. Risk factors for coronary artery disease include dyslipidemia, obesity, hypertension and post-menopausal. Current diabetic treatment includes oral agent (monotherapy). She is compliant with treatment all of the time. She is following a generally healthy diet. Her breakfast blood glucose range is generally 110-130 mg/dl. An ACE inhibitor/angiotensin II receptor blocker is being taken. Eye exam is current.  Hypertension This is a chronic problem. The current episode started more than 1 year ago. The problem has been resolved since onset. The problem is controlled. Associated symptoms include anxiety. Pertinent negatives include no blurred vision, headaches, palpitations, peripheral edema or shortness of breath. Risk factors for coronary artery disease include diabetes mellitus, dyslipidemia, female gender, post-menopausal state and sedentary lifestyle. Past treatments include angiotensin blockers. The current treatment provides significant improvement. There is no history of kidney disease, CAD/MI, CVA, heart failure or a thyroid problem. There is no history of sleep apnea.  Hyperlipidemia This is a chronic problem. The current episode started more than 1 year ago. The problem is controlled. Recent lipid  tests were reviewed and are normal. Exacerbating diseases include diabetes. Pertinent negatives include no leg pain or shortness of breath. Current antihyperlipidemic treatment includes statins. The current treatment provides significant improvement of lipids. Risk factors for coronary artery disease include diabetes mellitus, dyslipidemia, hypertension, obesity and post-menopausal.  Anxiety Presents for follow-up visit. Symptoms include insomnia. Patient reports no confusion, depressed mood, excessive worry, nausea, nervous/anxious behavior, palpitations or shortness of breath. Symptoms occur rarely. The quality of sleep is good.   Her past medical history is significant for anxiety/panic attacks. There is no history of CAD, CHF or depression. Past treatments include SSRIs. The treatment provided significant relief. Compliance with prior treatments has been good.      Review of Systems  Constitutional: Negative.   HENT: Negative.   Eyes: Negative.  Negative for blurred vision.  Respiratory: Negative.  Negative for shortness of breath.   Cardiovascular: Negative.  Negative for palpitations.  Gastrointestinal: Negative.  Negative for nausea.  Endocrine: Negative.   Genitourinary: Negative.   Musculoskeletal: Negative.   Skin: Negative for pallor.  Neurological: Negative.  Negative for headaches.  Hematological: Negative.   Psychiatric/Behavioral: Negative for confusion. The patient has insomnia. The patient is not nervous/anxious.   All other systems reviewed and are negative.      Objective:   Physical Exam  Constitutional: She is oriented to person, place, and time. She appears well-developed and well-nourished. No distress.  HENT:  Head: Normocephalic and atraumatic.  Right Ear: External ear normal.  Left Ear: External ear normal.  Nose: Nose normal.  Mouth/Throat: Oropharynx is clear and moist.  Eyes: Pupils are equal, round, and reactive to light.  Neck: Normal range of  motion. Neck supple. No thyromegaly present.  Cardiovascular: Normal rate, regular rhythm, normal heart sounds and intact distal pulses.   No murmur heard. Pulmonary/Chest: Effort normal and breath sounds normal. No  respiratory distress. She has no wheezes.  Abdominal: Soft. Bowel sounds are normal. She exhibits no distension. There is no tenderness.  Musculoskeletal: Normal range of motion. She exhibits no edema or tenderness.  Neurological: She is alert and oriented to person, place, and time. She has normal reflexes. No cranial nerve deficit.  Skin: Skin is warm and dry.  Psychiatric: She has a normal mood and affect. Her behavior is normal. Judgment and thought content normal.  Vitals reviewed.   BP 131/76 mmHg  Pulse 64  Temp(Src) 97.7 F (36.5 C) (Oral)  Ht _0  (1.702 m)  Wt 215 lb 12.8 oz (97.886 kg)  BMI 33.79 kg/m2       Assessment & Plan:  1. Hyperlipidemia - CMP14+EGFR - Lipid panel  2. Essential hypertension - CMP14+EGFR  3. Type 2 diabetes mellitus without complication - POCT glycosylated hemoglobin (Hb A1C) - POCT UA - Microalbumin - CMP14+EGFR  4. GAD (generalized anxiety disorder) - CMP14+EGFR  5. Insomnia - CMP14+EGFR  6. Vitamin D deficiency - CMP14+EGFR - Vit D  25 hydroxy (rtn osteoporosis monitoring)   Continue all meds Labs pending Health Maintenance reviewed Diet and exercise encouraged RTO 6 months  Evelina Dun, FNP

## 2015-01-11 LAB — VITAMIN D 25 HYDROXY (VIT D DEFICIENCY, FRACTURES): VIT D 25 HYDROXY: 22.4 ng/mL — AB (ref 30.0–100.0)

## 2015-01-11 LAB — CMP14+EGFR
ALBUMIN: 4.1 g/dL (ref 3.5–4.8)
ALT: 12 IU/L (ref 0–32)
AST: 14 IU/L (ref 0–40)
Albumin/Globulin Ratio: 1.6 (ref 1.1–2.5)
Alkaline Phosphatase: 74 IU/L (ref 39–117)
BUN/Creatinine Ratio: 16 (ref 11–26)
BUN: 12 mg/dL (ref 8–27)
Bilirubin Total: 0.2 mg/dL (ref 0.0–1.2)
CO2: 23 mmol/L (ref 18–29)
CREATININE: 0.73 mg/dL (ref 0.57–1.00)
Calcium: 9.6 mg/dL (ref 8.7–10.3)
Chloride: 105 mmol/L (ref 97–108)
GFR calc Af Amer: 95 mL/min/{1.73_m2} (ref >59)
GFR calc non Af Amer: 83 mL/min/{1.73_m2} (ref >59)
GLOBULIN, TOTAL: 2.5 g/dL (ref 1.5–4.5)
GLUCOSE: 128 mg/dL — AB (ref 65–99)
Potassium: 5 mmol/L (ref 3.5–5.2)
Sodium: 143 mmol/L (ref 134–144)
Total Protein: 6.6 g/dL (ref 6.0–8.5)

## 2015-01-11 LAB — LIPID PANEL
CHOL/HDL RATIO: 2.9 ratio (ref 0.0–4.4)
CHOLESTEROL TOTAL: 137 mg/dL (ref 100–199)
HDL: 48 mg/dL (ref >39)
LDL CALC: 73 mg/dL (ref 0–99)
TRIGLYCERIDES: 82 mg/dL (ref 0–149)
VLDL CHOLESTEROL CAL: 16 mg/dL (ref 5–40)

## 2015-01-13 ENCOUNTER — Telehealth: Payer: Self-pay | Admitting: *Deleted

## 2015-01-13 ENCOUNTER — Other Ambulatory Visit: Payer: Self-pay | Admitting: Family

## 2015-01-13 MED ORDER — VITAMIN D (ERGOCALCIFEROL) 1.25 MG (50000 UNIT) PO CAPS: 50000.0000 [IU] | ORAL_CAPSULE | ORAL | Status: DC

## 2015-01-13 NOTE — Telephone Encounter (Signed)
-----   Message from Sharion Balloon, [place] sent at 01/13/2015 12:27 PM EDT ----- HgbA1C WNL Kidney and liver function stable Cholesterol levels WNL Vit D levels low- Prescription sent to pharmacy

## 2015-01-18 DIAGNOSIS — Z5181 Encounter for therapeutic drug level monitoring: Secondary | ICD-10-CM | POA: Diagnosis not present

## 2015-01-18 DIAGNOSIS — Z170 Estrogen receptor positive status [ER+]: Secondary | ICD-10-CM | POA: Diagnosis not present

## 2015-01-18 DIAGNOSIS — C50912 Malignant neoplasm of unspecified site of left female breast: Secondary | ICD-10-CM | POA: Diagnosis not present

## 2015-01-18 DIAGNOSIS — Z79811 Long term (current) use of aromatase inhibitors: Secondary | ICD-10-CM | POA: Diagnosis not present

## 2015-01-24 ENCOUNTER — Other Ambulatory Visit: Payer: Self-pay | Admitting: Family

## 2015-03-06 DIAGNOSIS — C50912 Malignant neoplasm of unspecified site of left female breast: Secondary | ICD-10-CM | POA: Diagnosis not present

## 2015-03-08 ENCOUNTER — Encounter: Payer: Self-pay | Admitting: *Deleted

## 2015-04-27 ENCOUNTER — Other Ambulatory Visit: Payer: Self-pay | Admitting: Family

## 2015-05-18 ENCOUNTER — Other Ambulatory Visit: Payer: Self-pay

## 2015-05-18 MED ORDER — GLUCOSE BLOOD VI STRP: 1.0000 | ORAL_STRIP | Freq: Two times a day (BID) | Status: DC

## 2015-05-29 ENCOUNTER — Other Ambulatory Visit: Payer: Self-pay | Admitting: Family

## 2015-06-12 ENCOUNTER — Ambulatory Visit (INDEPENDENT_AMBULATORY_CARE_PROVIDER_SITE_OTHER): Payer: BC Managed Care – PPO | Admitting: Family Medicine

## 2015-06-12 ENCOUNTER — Encounter: Payer: Self-pay | Admitting: Family Medicine

## 2015-06-12 VITALS — BP 138/91 | HR 96 | Temp 97.8°F | Ht 67.0 in | Wt 207.0 lb

## 2015-06-12 DIAGNOSIS — R109 Unspecified abdominal pain: Secondary | ICD-10-CM

## 2015-06-12 DIAGNOSIS — N3001 Acute cystitis with hematuria: Secondary | ICD-10-CM

## 2015-06-12 DIAGNOSIS — N390 Urinary tract infection, site not specified: Secondary | ICD-10-CM | POA: Insufficient documentation

## 2015-06-12 LAB — POCT URINALYSIS DIPSTICK
Bilirubin, UA: NEGATIVE
GLUCOSE UA: NEGATIVE
Ketones, UA: NEGATIVE
Nitrite, UA: NEGATIVE
Protein, UA: NEGATIVE
UROBILINOGEN UA: NEGATIVE
pH, UA: 5

## 2015-06-12 LAB — POCT UA - MICROSCOPIC ONLY
Casts, Ur, LPF, POC: NEGATIVE
Crystals, Ur, HPF, POC: NEGATIVE
MUCUS UA: NEGATIVE
YEAST UA: NEGATIVE

## 2015-06-12 MED ORDER — CIPROFLOXACIN HCL 250 MG PO TABS: 250.0000 mg | ORAL_TABLET | Freq: Two times a day (BID) | ORAL | Status: DC

## 2015-06-12 NOTE — Progress Notes (Signed)
   HPI  Patient presents today here ilation of abdominal pain  Patient's when she's had 3 days of dull lower abdominal pain, chills off and on, diarrhea which is improving, and one day duration of burning with urination.  She denies any fevers, hematochezia, melena  She has had diverticulitis once before.  She developed dysuria today and describes that most of her abdominal pain is over her bladder.  PMH: Smoking status noted ROS: Per HPI  Objective: BP 138/91 mmHg  Pulse 96  Temp(Src) 97.8 F (36.6 C) (Oral)  Ht 5\' 7"  (1.702 m)  Wt 207 lb (93.895 kg)  BMI 32.41 kg/m2 Gen: NAD, alert, cooperative with exam HEENT: NCAT CV: RRR, good S1/S2, no murmur Resp: CTABL, no wheezes, non-labored Abd: oft, mild tenderness to palpation over her suprapubic area, no CVA tenderness Ext: No edema, warm Neuro: Alert and oriented, No gross deficits  Assessment and plan:  # UTI Treat with Cipro culture Follow-up as needed, reviewed reasons for return. Abd exam benign, no risk of pyelo   Orders Placed This Encounter  Procedures  . POCT urinalysis dipstick  . POCT UA - Microscopic Only    Meds ordered this encounter  Medications  . ciprofloxacin (CIPRO) 250 MG tablet    Sig: Take 1 tablet (250 mg total) by mouth 2 (two) times daily.    Dispense:  14 tablet    Refill:  0    Laroy Apple, MD [facility] 06/12/2015, 5:40 PM

## 2015-06-12 NOTE — Patient Instructions (Signed)
Great to meet you!  Urinary Tract Infection Urinary tract infections (UTIs) can develop anywhere along your urinary tract. Your urinary tract is your body's drainage system for removing wastes and extra water. Your urinary tract includes two kidneys, two ureters, a bladder, and a urethra. Your kidneys are a pair of bean-shaped organs. Each kidney is about the size of your fist. They are located below your ribs, one on each side of your spine. CAUSES Infections are caused by microbes, which are microscopic organisms, including fungi, viruses, and bacteria. These organisms are so small that they can only be seen through a microscope. Bacteria are the microbes that most commonly cause UTIs. SYMPTOMS  Symptoms of UTIs may vary by age and gender of the patient and by the location of the infection. Symptoms in young women typically include a frequent and intense urge to urinate and a painful, burning feeling in the bladder or urethra during urination. Older women and men are more likely to be tired, shaky, and weak and have muscle aches and abdominal pain. A fever may mean the infection is in your kidneys. Other symptoms of a kidney infection include pain in your back or sides below the ribs, nausea, and vomiting. DIAGNOSIS To diagnose a UTI, your caregiver will ask you about your symptoms. Your caregiver also will ask to provide a urine sample. The urine sample will be tested for bacteria and white blood cells. White blood cells are made by your body to help fight infection. TREATMENT  Typically, UTIs can be treated with medication. Because most UTIs are caused by a bacterial infection, they usually can be treated with the use of antibiotics. The choice of antibiotic and length of treatment depend on your symptoms and the type of bacteria causing your infection. HOME CARE INSTRUCTIONS  If you were prescribed antibiotics, take them exactly as your caregiver instructs you. Finish the medication even if you  feel better after you have only taken some of the medication.  Drink enough water and fluids to keep your urine clear or pale yellow.  Avoid caffeine, tea, and carbonated beverages. They tend to irritate your bladder.  Empty your bladder often. Avoid holding urine for long periods of time.  Empty your bladder before and after sexual intercourse.  After a bowel movement, women should cleanse from front to back. Use each tissue only once. SEEK MEDICAL CARE IF:   You have back pain.  You develop a fever.  Your symptoms do not begin to resolve within 3 days. SEEK IMMEDIATE MEDICAL CARE IF:   You have severe back pain or lower abdominal pain.  You develop chills.  You have nausea or vomiting.  You have continued burning or discomfort with urination. MAKE SURE YOU:   Understand these instructions.  Will watch your condition.  Will get help right away if you are not doing well or get worse. Document Released: 06/12/2005 Document Revised: 03/03/2012 Document Reviewed: 10/11/2011 [facility] Patient Information 2015 [place], [place]. This information is not intended to replace advice given to you by your health care provider. Make sure you discuss any questions you have with your health care provider.

## 2015-06-16 ENCOUNTER — Telehealth: Payer: Self-pay | Admitting: Family

## 2015-06-16 MED ORDER — FLUCONAZOLE 150 MG PO TABS: 150.0000 mg | ORAL_TABLET | Freq: Once | ORAL | Status: DC

## 2015-06-16 NOTE — Telephone Encounter (Signed)
Patient has developed yeast and needs medicine to relieve problem.  Script ready. If approved please send to [facility].

## 2015-06-17 NOTE — Telephone Encounter (Signed)
Patient informed. 

## 2015-06-27 ENCOUNTER — Telehealth: Payer: Self-pay | Admitting: Family

## 2015-06-27 NOTE — Telephone Encounter (Signed)
Done and faxed

## 2015-07-03 ENCOUNTER — Other Ambulatory Visit: Payer: Self-pay | Admitting: Family

## 2015-07-03 ENCOUNTER — Other Ambulatory Visit: Payer: Self-pay | Admitting: *Deleted

## 2015-07-03 DIAGNOSIS — E785 Hyperlipidemia, unspecified: Secondary | ICD-10-CM

## 2015-07-03 MED ORDER — ATORVASTATIN CALCIUM 40 MG PO TABS: 40.0000 mg | ORAL_TABLET | Freq: Every day | ORAL | Status: DC

## 2015-07-11 ENCOUNTER — Ambulatory Visit (INDEPENDENT_AMBULATORY_CARE_PROVIDER_SITE_OTHER): Payer: BC Managed Care – PPO | Admitting: Family

## 2015-07-11 ENCOUNTER — Encounter: Payer: Self-pay | Admitting: Family

## 2015-07-11 VITALS — BP 125/78 | HR 71 | Temp 97.3°F | Wt 210.0 lb

## 2015-07-11 DIAGNOSIS — Z1211 Encounter for screening for malignant neoplasm of colon: Secondary | ICD-10-CM

## 2015-07-11 DIAGNOSIS — Z23 Encounter for immunization: Secondary | ICD-10-CM

## 2015-07-11 DIAGNOSIS — F411 Generalized anxiety disorder: Secondary | ICD-10-CM

## 2015-07-11 DIAGNOSIS — E785 Hyperlipidemia, unspecified: Secondary | ICD-10-CM

## 2015-07-11 DIAGNOSIS — E559 Vitamin D deficiency, unspecified: Secondary | ICD-10-CM

## 2015-07-11 DIAGNOSIS — G4700 Insomnia, unspecified: Secondary | ICD-10-CM | POA: Diagnosis not present

## 2015-07-11 DIAGNOSIS — E119 Type 2 diabetes mellitus without complications: Secondary | ICD-10-CM

## 2015-07-11 DIAGNOSIS — I10 Essential (primary) hypertension: Secondary | ICD-10-CM | POA: Diagnosis not present

## 2015-07-11 LAB — POCT UA - MICROALBUMIN: MICROALBUMIN (UR) POC: NEGATIVE mg/L

## 2015-07-11 LAB — POCT GLYCOSYLATED HEMOGLOBIN (HGB A1C): HEMOGLOBIN A1C: 6.2

## 2015-07-11 MED ORDER — ASPIRIN EC 81 MG PO TBEC: 81.0000 mg | DELAYED_RELEASE_TABLET | Freq: Every day | ORAL | Status: AC

## 2015-07-11 NOTE — Patient Instructions (Signed)

## 2015-07-11 NOTE — Progress Notes (Signed)
Subjective:    Patient ID: Jeanette Daniels, female    DOB: 03-May-1941, 74 y.o.   MRN: [id number]  Pt presents to the office today for chronic follow up.  Diabetes She presents for her follow-up diabetic visit. She has type 2 diabetes mellitus. Her disease course has been stable. Pertinent negatives for hypoglycemia include no headaches or mood changes. Pertinent negatives for diabetes include no blurred vision, no foot paresthesias, no foot ulcerations and no visual change. Pertinent negatives for hypoglycemia complications include no blackouts and no hospitalization. Symptoms are stable. Pertinent negatives for diabetic complications include no autonomic neuropathy, CVA, heart disease, nephropathy or peripheral neuropathy. Risk factors for coronary artery disease include dyslipidemia, obesity, hypertension and post-menopausal. Current diabetic treatment includes oral agent (monotherapy). She is compliant with treatment all of the time. She is following a generally healthy diet. Her breakfast blood glucose range is generally 110-130 mg/dl. An ACE inhibitor/angiotensin II receptor blocker is being taken. Eye exam is current.  Hyperlipidemia This is a chronic problem. The current episode started more than 1 year ago. The problem is controlled. Recent lipid tests were reviewed and are normal. Exacerbating diseases include diabetes. Pertinent negatives include no leg pain or shortness of breath. Current antihyperlipidemic treatment includes statins. The current treatment provides significant improvement of lipids. Risk factors for coronary artery disease include diabetes mellitus, dyslipidemia, hypertension, obesity and post-menopausal.  Hypertension This is a chronic problem. The current episode started more than 1 year ago. The problem has been resolved since onset. The problem is controlled. Associated symptoms include anxiety. Pertinent negatives include no blurred vision, headaches, palpitations,  peripheral edema or shortness of breath. Risk factors for coronary artery disease include diabetes mellitus, dyslipidemia, female gender, post-menopausal state and sedentary lifestyle. Past treatments include angiotensin blockers. The current treatment provides significant improvement. There is no history of kidney disease, CAD/MI, CVA, heart failure or a thyroid problem. There is no history of sleep apnea.  Anxiety Presents for follow-up visit. Onset was more than 5 years ago. The problem has been waxing and waning. Patient reports no depressed mood, excessive worry, nausea, palpitations or shortness of breath. Symptoms occur rarely. The severity of symptoms is mild. The quality of sleep is good.   Her past medical history is significant for anxiety/panic attacks. There is no history of CAD, CHF or depression. Past treatments include SSRIs. The treatment provided significant relief. Compliance with prior treatments has been good.      Review of Systems  Constitutional: Negative.   HENT: Negative.   Eyes: Negative.  Negative for blurred vision.  Respiratory: Negative.  Negative for shortness of breath.   Cardiovascular: Negative.  Negative for palpitations.  Gastrointestinal: Negative.  Negative for nausea.  Endocrine: Negative.   Genitourinary: Negative.   Musculoskeletal: Negative.   Neurological: Negative.  Negative for headaches.  Hematological: Negative.   Psychiatric/Behavioral: Negative.   All other systems reviewed and are negative.      Objective:   Physical Exam  Constitutional: She is oriented to person, place, and time. She appears well-developed and well-nourished. No distress.  HENT:  Head: Normocephalic and atraumatic.  Right Ear: External ear normal.  Left Ear: External ear normal.  Nose: Nose normal.  Mouth/Throat: Oropharynx is clear and moist.  Eyes: Pupils are equal, round, and reactive to light.  Neck: Normal range of motion. Neck supple. No thyromegaly present.   Cardiovascular: Normal rate, regular rhythm, normal heart sounds and intact distal pulses.   No murmur heard. Pulmonary/Chest: Effort  normal and breath sounds normal. No respiratory distress. She has no wheezes.  Abdominal: Soft. Bowel sounds are normal. She exhibits no distension. There is no tenderness.  Musculoskeletal: Normal range of motion. She exhibits no edema or tenderness.  Neurological: She is alert and oriented to person, place, and time. She has normal reflexes. No cranial nerve deficit.  Skin: Skin is warm and dry.  Psychiatric: She has a normal mood and affect. Her behavior is normal. Judgment and thought content normal.  Vitals reviewed.     BP 125/78 mmHg  Pulse 71  Temp(Src) 97.3 F (36.3 C) (Oral)  Wt 210 lb (95.255 kg)     Assessment & Plan:  1. Essential hypertension - CMP14+EGFR - aspirin EC 81 MG tablet; Take 1 tablet (81 mg total) by mouth daily.  Dispense: 90 tablet; Refill: 2  2. Type 2 diabetes mellitus without complication, without long-term current use of insulin (HCC) - POCT glycosylated hemoglobin (Hb A1C) - POCT UA - Microalbumin - CMP14+EGFR - aspirin EC 81 MG tablet; Take 1 tablet (81 mg total) by mouth daily.  Dispense: 90 tablet; Refill: 2  3. GAD (generalized anxiety disorder) - CMP14+EGFR  4. Hyperlipidemia - CMP14+EGFR - Lipid panel - aspirin EC 81 MG tablet; Take 1 tablet (81 mg total) by mouth daily.  Dispense: 90 tablet; Refill: 2  5. Insomnia - CMP14+EGFR  6. Vitamin D deficiency - CMP14+EGFR - Vit D  25 hydroxy (rtn osteoporosis monitoring)  7. Colon cancer screening - CMP14+EGFR - Fecal occult blood, imunochemical; Future   Continue all meds Labs pending Health Maintenance reviewed- Flu vaccine Diet and exercise encouraged RTO 6 months  Evelina Dun, FNP

## 2015-07-12 LAB — CMP14+EGFR
A/G RATIO: 1.5 (ref 1.1–2.5)
ALBUMIN: 4.3 g/dL (ref 3.5–4.8)
ALT: 16 IU/L (ref 0–32)
AST: 16 IU/L (ref 0–40)
Alkaline Phosphatase: 83 IU/L (ref 39–117)
BUN / CREAT RATIO: 15 (ref 11–26)
BUN: 12 mg/dL (ref 8–27)
Bilirubin Total: 0.3 mg/dL (ref 0.0–1.2)
CALCIUM: 10 mg/dL (ref 8.7–10.3)
CO2: 23 mmol/L (ref 18–29)
Chloride: 101 mmol/L (ref 97–106)
Creatinine, Ser: 0.79 mg/dL (ref 0.57–1.00)
GFR, EST AFRICAN AMERICAN: 86 mL/min/{1.73_m2} (ref >59)
GFR, EST NON AFRICAN AMERICAN: 74 mL/min/{1.73_m2} (ref >59)
GLOBULIN, TOTAL: 2.9 g/dL (ref 1.5–4.5)
Glucose: 112 mg/dL — ABNORMAL HIGH (ref 65–99)
POTASSIUM: 4.9 mmol/L (ref 3.5–5.2)
SODIUM: 141 mmol/L (ref 136–144)
TOTAL PROTEIN: 7.2 g/dL (ref 6.0–8.5)

## 2015-07-12 LAB — VITAMIN D 25 HYDROXY (VIT D DEFICIENCY, FRACTURES): Vit D, 25-Hydroxy: 33.5 ng/mL (ref 30.0–100.0)

## 2015-07-12 LAB — LIPID PANEL
CHOL/HDL RATIO: 3.3 ratio (ref 0.0–4.4)
CHOLESTEROL TOTAL: 138 mg/dL (ref 100–199)
HDL: 42 mg/dL (ref >39)
LDL CALC: 84 mg/dL (ref 0–99)
TRIGLYCERIDES: 62 mg/dL (ref 0–149)
VLDL CHOLESTEROL CAL: 12 mg/dL (ref 5–40)

## 2015-07-13 ENCOUNTER — Telehealth: Payer: Self-pay | Admitting: Family

## 2015-07-13 NOTE — Telephone Encounter (Signed)
Details of lab results given.

## 2015-07-17 ENCOUNTER — Telehealth: Payer: Self-pay | Admitting: Family

## 2015-07-17 DIAGNOSIS — I10 Essential (primary) hypertension: Secondary | ICD-10-CM

## 2015-07-17 MED ORDER — METOPROLOL SUCCINATE ER 100 MG PO TB24: ORAL_TABLET | ORAL | Status: DC

## 2015-07-17 MED ORDER — METFORMIN HCL 500 MG PO TABS: 500.0000 mg | ORAL_TABLET | Freq: Every day | ORAL | Status: DC

## 2015-07-17 NOTE — Telephone Encounter (Signed)
done

## 2015-07-19 ENCOUNTER — Telehealth: Payer: Self-pay | Admitting: Family

## 2015-07-19 NOTE — Telephone Encounter (Signed)
Patient called stating that she has been taking Metformin twice daily but the Rx she picked up yesterday states to take once daily.  Which way does she need to be taking this medication?

## 2015-07-20 MED ORDER — METFORMIN HCL 500 MG PO TABS
500.0000 mg | ORAL_TABLET | Freq: Two times a day (BID) | ORAL | Status: DC
Start: 2015-07-20 — End: 2016-07-27

## 2015-07-20 NOTE — Addendum Note (Signed)
Addended by: Evelina Dun A on: 07/20/2015 09:50 AM   Modules accepted: Orders

## 2015-07-20 NOTE — Telephone Encounter (Signed)
Changed metformin to BID. Prescription sent to pharmacy

## 2015-07-20 NOTE — Telephone Encounter (Signed)
Pt aware.

## 2015-07-26 DIAGNOSIS — Z170 Estrogen receptor positive status [ER+]: Secondary | ICD-10-CM | POA: Diagnosis not present

## 2015-07-26 DIAGNOSIS — C50912 Malignant neoplasm of unspecified site of left female breast: Secondary | ICD-10-CM | POA: Diagnosis not present

## 2015-07-26 DIAGNOSIS — M8580 Other specified disorders of bone density and structure, unspecified site: Secondary | ICD-10-CM | POA: Insufficient documentation

## 2015-07-26 DIAGNOSIS — Z9012 Acquired absence of left breast and nipple: Secondary | ICD-10-CM | POA: Diagnosis not present

## 2015-07-26 DIAGNOSIS — Z5181 Encounter for therapeutic drug level monitoring: Secondary | ICD-10-CM | POA: Diagnosis not present

## 2015-07-26 DIAGNOSIS — Z79811 Long term (current) use of aromatase inhibitors: Secondary | ICD-10-CM | POA: Diagnosis not present

## 2015-07-31 ENCOUNTER — Telehealth: Payer: Self-pay | Admitting: Family

## 2015-07-31 ENCOUNTER — Other Ambulatory Visit: Payer: Self-pay | Admitting: Family

## 2015-07-31 NOTE — Telephone Encounter (Signed)
Done what was requested from [facility]

## 2015-09-04 ENCOUNTER — Ambulatory Visit: Payer: BC Managed Care – PPO

## 2015-09-29 ENCOUNTER — Encounter: Payer: BC Managed Care – PPO | Admitting: *Deleted

## 2015-09-29 NOTE — Progress Notes (Signed)
This encounter was created in error - please disregard.

## 2015-10-06 ENCOUNTER — Other Ambulatory Visit: Payer: Self-pay | Admitting: Family

## 2015-10-23 ENCOUNTER — Other Ambulatory Visit: Payer: Self-pay | Admitting: Family

## 2015-12-12 ENCOUNTER — Other Ambulatory Visit: Payer: Self-pay | Admitting: Family

## 2015-12-14 ENCOUNTER — Other Ambulatory Visit: Payer: Self-pay | Admitting: Family

## 2015-12-18 ENCOUNTER — Other Ambulatory Visit: Payer: Self-pay | Admitting: Family

## 2015-12-19 NOTE — Telephone Encounter (Signed)
Last seen 07/11/15  [facility]

## 2015-12-19 NOTE — Telephone Encounter (Signed)
Patient NTBS for follow up and lab work  

## 2016-01-01 ENCOUNTER — Other Ambulatory Visit: Payer: Self-pay | Admitting: Family

## 2016-01-08 DIAGNOSIS — M25562 Pain in left knee: Secondary | ICD-10-CM | POA: Diagnosis not present

## 2016-01-08 DIAGNOSIS — M25561 Pain in right knee: Secondary | ICD-10-CM | POA: Diagnosis not present

## 2016-01-09 ENCOUNTER — Encounter: Payer: Self-pay | Admitting: Family

## 2016-01-09 ENCOUNTER — Ambulatory Visit (INDEPENDENT_AMBULATORY_CARE_PROVIDER_SITE_OTHER): Payer: BC Managed Care – PPO | Admitting: Family

## 2016-01-09 VITALS — BP 143/79 | HR 66 | Temp 97.7°F | Ht 67.0 in | Wt 209.8 lb

## 2016-01-09 DIAGNOSIS — E119 Type 2 diabetes mellitus without complications: Secondary | ICD-10-CM

## 2016-01-09 DIAGNOSIS — E559 Vitamin D deficiency, unspecified: Secondary | ICD-10-CM

## 2016-01-09 DIAGNOSIS — K5900 Constipation, unspecified: Secondary | ICD-10-CM

## 2016-01-09 DIAGNOSIS — Z1211 Encounter for screening for malignant neoplasm of colon: Secondary | ICD-10-CM

## 2016-01-09 DIAGNOSIS — E785 Hyperlipidemia, unspecified: Secondary | ICD-10-CM

## 2016-01-09 DIAGNOSIS — I10 Essential (primary) hypertension: Secondary | ICD-10-CM

## 2016-01-09 DIAGNOSIS — F411 Generalized anxiety disorder: Secondary | ICD-10-CM

## 2016-01-09 DIAGNOSIS — G4700 Insomnia, unspecified: Secondary | ICD-10-CM | POA: Diagnosis not present

## 2016-01-09 NOTE — Patient Instructions (Signed)
Health Maintenance, Female Adopting a healthy lifestyle and getting preventive care can go a long way to promote health and wellness. Talk with your health care provider about what schedule of regular examinations is right for you. This is a good chance for you to check in with your provider about disease prevention and staying healthy. In between checkups, there are plenty of things you can do on your own. Experts have done a lot of research about which lifestyle changes and preventive measures are most likely to keep you healthy. Ask your health care provider for more information. WEIGHT AND DIET  Eat a healthy diet  Be sure to include plenty of vegetables, fruits, low-fat dairy products, and lean protein.  Do not eat a lot of foods high in solid fats, added sugars, or salt.  Get regular exercise. This is one of the most important things you can do for your health.  Most adults should exercise for at least 150 minutes each week. The exercise should increase your heart rate and make you sweat (moderate-intensity exercise).  Most adults should also do strengthening exercises at least twice a week. This is in addition to the moderate-intensity exercise.  Maintain a healthy weight  Body mass index (BMI) is a measurement that can be used to identify possible weight problems. It estimates body fat based on height and weight. Your health care provider can help determine your BMI and help you achieve or maintain a healthy weight.  For females 20 years of age and older:   A BMI below 18.5 is considered underweight.  A BMI of 18.5 to 24.9 is normal.  A BMI of 25 to 29.9 is considered overweight.  A BMI of 30 and above is considered obese.  Watch levels of cholesterol and blood lipids  You should start having your blood tested for lipids and cholesterol at 74 years of age, then have this test every 5 years.  You may need to have your cholesterol levels checked more often if:  Your lipid  or cholesterol levels are high.  You are older than 74 years of age.  You are at high risk for heart disease.  CANCER SCREENING   Lung Cancer  Lung cancer screening is recommended for adults 55-80 years old who are at high risk for lung cancer because of a history of smoking.  A yearly low-dose CT scan of the lungs is recommended for people who:  Currently smoke.  Have quit within the past 15 years.  Have at least a 30-pack-year history of smoking. A pack year is smoking an average of one pack of cigarettes a day for 1 year.  Yearly screening should continue until it has been 15 years since you quit.  Yearly screening should stop if you develop a health problem that would prevent you from having lung cancer treatment.  Breast Cancer  Practice breast self-awareness. This means understanding how your breasts normally appear and feel.  It also means doing regular breast self-exams. Let your health care provider know about any changes, no matter how small.  If you are in your 20s or 30s, you should have a clinical breast exam (CBE) by a health care provider every 1-3 years as part of a regular health exam.  If you are 40 or older, have a CBE every year. Also consider having a breast X-ray (mammogram) every year.  If you have a family history of breast cancer, talk to your health care provider about genetic screening.  If you   are at high risk for breast cancer, talk to your health care provider about having an MRI and a mammogram every year.  Breast cancer gene (BRCA) assessment is recommended for women who have family members with BRCA-related cancers. BRCA-related cancers include:  Breast.  Ovarian.  Tubal.  Peritoneal cancers.  Results of the assessment will determine the need for genetic counseling and BRCA1 and BRCA2 testing. Cervical Cancer Your health care provider may recommend that you be screened regularly for cancer of the pelvic organs (ovaries, uterus, and  vagina). This screening involves a pelvic examination, including checking for microscopic changes to the surface of your cervix (Pap test). You may be encouraged to have this screening done every 3 years, beginning at age 21.  For women ages 30-65, health care providers may recommend pelvic exams and Pap testing every 3 years, or they may recommend the Pap and pelvic exam, combined with testing for human papilloma virus (HPV), every 5 years. Some types of HPV increase your risk of cervical cancer. Testing for HPV may also be done on women of any age with unclear Pap test results.  Other health care providers may not recommend any screening for nonpregnant women who are considered low risk for pelvic cancer and who do not have symptoms. Ask your health care provider if a screening pelvic exam is right for you.  If you have had past treatment for cervical cancer or a condition that could lead to cancer, you need Pap tests and screening for cancer for at least 20 years after your treatment. If Pap tests have been discontinued, your risk factors (such as having a new sexual partner) need to be reassessed to determine if screening should resume. Some women have medical problems that increase the chance of getting cervical cancer. In these cases, your health care provider may recommend more frequent screening and Pap tests. Colorectal Cancer  This type of cancer can be detected and often prevented.  Routine colorectal cancer screening usually begins at 74 years of age and continues through 75 years of age.  Your health care provider may recommend screening at an earlier age if you have risk factors for colon cancer.  Your health care provider may also recommend using home test kits to check for hidden blood in the stool.  A small camera at the end of a tube can be used to examine your colon directly (sigmoidoscopy or colonoscopy). This is done to check for the earliest forms of colorectal  cancer.  Routine screening usually begins at age 50.  Direct examination of the colon should be repeated every 5-10 years through 75 years of age. However, you may need to be screened more often if early forms of precancerous polyps or small growths are found. Skin Cancer  Check your skin from head to toe regularly.  Tell your health care provider about any new moles or changes in moles, especially if there is a change in a mole's shape or color.  Also tell your health care provider if you have a mole that is larger than the size of a pencil eraser.  Always use sunscreen. Apply sunscreen liberally and repeatedly throughout the day.  Protect yourself by wearing long sleeves, pants, a wide-brimmed hat, and sunglasses whenever you are outside. HEART DISEASE, DIABETES, AND HIGH BLOOD PRESSURE   High blood pressure causes heart disease and increases the risk of stroke. High blood pressure is more likely to develop in:  People who have blood pressure in the high end   of the normal range (130-139/85-89 mm Hg).  People who are overweight or obese.  People who are African American.  If you are 38-23 years of age, have your blood pressure checked every 3-5 years. If you are 61 years of age or older, have your blood pressure checked every year. You should have your blood pressure measured twice--once when you are at a hospital or clinic, and once when you are not at a hospital or clinic. Record the average of the two measurements. To check your blood pressure when you are not at a hospital or clinic, you can use:  An automated blood pressure machine at a pharmacy.  A home blood pressure monitor.  If you are between 45 years and 39 years old, ask your health care provider if you should take aspirin to prevent strokes.  Have regular diabetes screenings. This involves taking a blood sample to check your fasting blood sugar level.  If you are at a normal weight and have a low risk for diabetes,  have this test once every three years after 74 years of age.  If you are overweight and have a high risk for diabetes, consider being tested at a younger age or more often. PREVENTING INFECTION  Hepatitis B  If you have a higher risk for hepatitis B, you should be screened for this virus. You are considered at high risk for hepatitis B if:  You were born in a country where hepatitis B is common. Ask your health care provider which countries are considered high risk.  Your parents were born in a high-risk country, and you have not been immunized against hepatitis B (hepatitis B vaccine).  You have HIV or AIDS.  You use needles to inject street drugs.  You live with someone who has hepatitis B.  You have had sex with someone who has hepatitis B.  You get hemodialysis treatment.  You take certain medicines for conditions, including cancer, organ transplantation, and autoimmune conditions. Hepatitis C  Blood testing is recommended for:  Everyone born from [birth year] through 1964.  Anyone with known risk factors for hepatitis C. Sexually transmitted infections (STIs)  You should be screened for sexually transmitted infections (STIs) including gonorrhea and chlamydia if:  You are sexually active and are younger than 74 years of age.  You are older than 74 years of age and your health care provider tells you that you are at risk for this type of infection.  Your sexual activity has changed since you were last screened and you are at an increased risk for chlamydia or gonorrhea. Ask your health care provider if you are at risk.  If you do not have HIV, but are at risk, it may be recommended that you take a prescription medicine daily to prevent HIV infection. This is called pre-exposure prophylaxis (PrEP). You are considered at risk if:  You are sexually active and do not regularly use condoms or know the HIV status of your partner(s).  You take drugs by injection.  You are sexually  active with a partner who has HIV. Talk with your health care provider about whether you are at high risk of being infected with HIV. If you choose to begin PrEP, you should first be tested for HIV. You should then be tested every 3 months for as long as you are taking PrEP.  PREGNANCY   If you are premenopausal and you may become pregnant, ask your health care provider about preconception counseling.  If you may  become pregnant, take 400 to 800 micrograms (mcg) of folic acid every day.  If you want to prevent pregnancy, talk to your health care provider about birth control (contraception). OSTEOPOROSIS AND MENOPAUSE   Osteoporosis is a disease in which the bones lose minerals and strength with aging. This can result in serious bone fractures. Your risk for osteoporosis can be identified using a bone density scan.  If you are 61 years of age or older, or if you are at risk for osteoporosis and fractures, ask your health care provider if you should be screened.  Ask your health care provider whether you should take a calcium or vitamin D supplement to lower your risk for osteoporosis.  Menopause may have certain physical symptoms and risks.  Hormone replacement therapy may reduce some of these symptoms and risks. Talk to your health care provider about whether hormone replacement therapy is right for you.  HOME CARE INSTRUCTIONS   Schedule regular health, dental, and eye exams.  Stay current with your immunizations.   Do not use any tobacco products including cigarettes, chewing tobacco, or electronic cigarettes.  If you are pregnant, do not drink alcohol.  If you are breastfeeding, limit how much and how often you drink alcohol.  Limit alcohol intake to no more than 1 drink per day for nonpregnant women. One drink equals 12 ounces of beer, 5 ounces of wine, or 1 ounces of hard liquor.  Do not use street drugs.  Do not share needles.  Ask your health care provider for help if  you need support or information about quitting drugs.  Tell your health care provider if you often feel depressed.  Tell your health care provider if you have ever been abused or do not feel safe at home.   This information is not intended to replace advice given to you by your health care provider. Make sure you discuss any questions you have with your health care provider.   Document Released: 03/18/2011 Document Revised: 09/23/2014 Document Reviewed: 08/04/2013 Elsevier Interactive Patient Education [employer].

## 2016-01-09 NOTE — Progress Notes (Signed)
Subjective:    Patient ID: Jeanette Daniels, female    DOB: 05/21/1941, 75 y.o.   MRN: [id number]  Pt presents to the office today for chronic follow up.  Diabetes She presents for her follow-up diabetic visit. She has type 2 diabetes mellitus. Her disease course has been stable. Pertinent negatives for hypoglycemia include no headaches or mood changes. Pertinent negatives for diabetes include no blurred vision, no chest pain, no foot paresthesias, no foot ulcerations, no polydipsia, no polyphagia, no polyuria and no visual change. Pertinent negatives for hypoglycemia complications include no blackouts and no hospitalization. Symptoms are stable. Pertinent negatives for diabetic complications include no autonomic neuropathy, CVA, heart disease, nephropathy or peripheral neuropathy. Risk factors for coronary artery disease include dyslipidemia, obesity, hypertension and post-menopausal. Current diabetic treatment includes oral agent (monotherapy). She is compliant with treatment all of the time. She is following a generally healthy diet. There is no change in her home blood glucose trend. Her breakfast blood glucose range is generally 110-130 mg/dl. An ACE inhibitor/angiotensin II receptor blocker is being taken. Eye exam is current.  Hypertension This is a chronic problem. The current episode started more than 1 year ago. The problem has been resolved since onset. The problem is controlled. Associated symptoms include anxiety. Pertinent negatives include no blurred vision, chest pain, headaches, palpitations, peripheral edema or shortness of breath. Risk factors for coronary artery disease include diabetes mellitus, dyslipidemia, female gender, post-menopausal state and sedentary lifestyle. Past treatments include angiotensin blockers. The current treatment provides significant improvement. There is no history of kidney disease, CAD/MI, CVA, heart failure or a thyroid problem. There is no history of sleep apnea.   Hyperlipidemia This is a chronic problem. The current episode started more than 1 year ago. The problem is controlled. Recent lipid tests were reviewed and are normal. Exacerbating diseases include diabetes. Pertinent negatives include no chest pain, leg pain or shortness of breath. Current antihyperlipidemic treatment includes statins. The current treatment provides significant improvement of lipids. Risk factors for coronary artery disease include diabetes mellitus, dyslipidemia, hypertension, obesity and post-menopausal.  Anxiety Presents for follow-up visit. Onset was more than 5 years ago. The problem has been waxing and waning. Symptoms include insomnia. Patient reports no chest pain, depressed mood, excessive worry, nausea, palpitations or shortness of breath. Symptoms occur rarely. The severity of symptoms is mild. The quality of sleep is good.   Her past medical history is significant for anxiety/panic attacks. There is no history of CAD, CHF or depression. Past treatments include SSRIs. The treatment provided significant relief. Compliance with prior treatments has been good.  Insomnia Primary symptoms: sleep disturbance, difficulty falling asleep.  The current episode started more than one year. The onset quality is gradual. The problem occurs rarely. The problem has been waxing and waning since onset. Past treatments include meditation and medication. The treatment provided moderate relief.  Constipation This is a chronic problem. The current episode started more than 1 year ago. The problem has been resolved since onset. Her stool frequency is 4 to 5 times per week. The patient is on a high fiber diet. She exercises regularly. There has been adequate water intake. Pertinent negatives include no diarrhea or nausea. She has tried diet changes and stool softeners for the symptoms. The treatment provided moderate relief.      Review of Systems  Constitutional: Negative.   HENT: Negative.    Eyes: Negative.  Negative for blurred vision.  Respiratory: Negative.  Negative for shortness of breath.  Cardiovascular: Negative.  Negative for chest pain and palpitations.  Gastrointestinal: Positive for constipation. Negative for nausea and diarrhea.  Endocrine: Negative.  Negative for polydipsia, polyphagia and polyuria.  Genitourinary: Negative.   Musculoskeletal: Negative.   Neurological: Negative.  Negative for headaches.  Hematological: Negative.   Psychiatric/Behavioral: Positive for sleep disturbance. The patient has insomnia.   All other systems reviewed and are negative.      Objective:   Physical Exam  Constitutional: She is oriented to person, place, and time. She appears well-developed and well-nourished. No distress.  HENT:  Head: Normocephalic and atraumatic.  Right Ear: External ear normal.  Left Ear: External ear normal.  Nose: Nose normal.  Mouth/Throat: Oropharynx is clear and moist.  Eyes: Pupils are equal, round, and reactive to light.  Neck: Normal range of motion. Neck supple. No thyromegaly present.  Cardiovascular: Normal rate, regular rhythm, normal heart sounds and intact distal pulses.   No murmur heard. Pulmonary/Chest: Effort normal and breath sounds normal. No respiratory distress. She has no wheezes.  Abdominal: Soft. Bowel sounds are normal. She exhibits no distension. There is no tenderness.  Musculoskeletal: Normal range of motion. She exhibits no edema or tenderness.  Neurological: She is alert and oriented to person, place, and time. She has normal reflexes. No cranial nerve deficit.  Skin: Skin is warm and dry.  Psychiatric: She has a normal mood and affect. Her behavior is normal. Judgment and thought content normal.  Vitals reviewed.     BP 143/79 mmHg  Pulse 66  Temp(Src) 97.7 F (36.5 C) (Oral)  Ht _0  (1.702 m)  Wt 209 lb 12.8 oz (95.165 kg)  BMI 32.85 kg/m2     Assessment & Plan:  1. Essential hypertension -  CMP14+EGFR  2. Type 2 diabetes mellitus without complication, without long-term current use of insulin ([place]) - Ambulatory referral to Ophthalmology - CMP14+EGFR  3. Hyperlipidemia - CMP14+EGFR - Lipid panel  4. GAD (generalized anxiety disorder) - CMP14+EGFR  5. Insomnia - CMP14+EGFR  6. Vitamin D deficiency - CMP14+EGFR  7. Constipation, unspecified constipation type - CMP14+EGFR  8. Colon cancer screening - CMP14+EGFR - Fecal occult blood, imunochemical; Future   Continue all meds Labs pending Health Maintenance reviewed Diet and exercise encouraged RTO 6 months  Evelina Dun, FNP

## 2016-01-10 ENCOUNTER — Telehealth: Payer: Self-pay | Admitting: Family

## 2016-01-10 LAB — CMP14+EGFR
ALBUMIN: 4.3 g/dL (ref 3.5–4.8)
ALT: 14 IU/L (ref 0–32)
AST: 14 IU/L (ref 0–40)
Albumin/Globulin Ratio: 1.6 (ref 1.2–2.2)
Alkaline Phosphatase: 75 IU/L (ref 39–117)
BILIRUBIN TOTAL: 0.3 mg/dL (ref 0.0–1.2)
BUN / CREAT RATIO: 21 (ref 12–28)
BUN: 16 mg/dL (ref 8–27)
CO2: 25 mmol/L (ref 18–29)
CREATININE: 0.77 mg/dL (ref 0.57–1.00)
Calcium: 10.2 mg/dL (ref 8.7–10.3)
Chloride: 100 mmol/L (ref 96–106)
GFR calc non Af Amer: 77 mL/min/{1.73_m2} (ref >59)
GFR, EST AFRICAN AMERICAN: 89 mL/min/{1.73_m2} (ref >59)
GLUCOSE: 97 mg/dL (ref 65–99)
Globulin, Total: 2.7 g/dL (ref 1.5–4.5)
Potassium: 4.4 mmol/L (ref 3.5–5.2)
Sodium: 139 mmol/L (ref 134–144)
TOTAL PROTEIN: 7 g/dL (ref 6.0–8.5)

## 2016-01-10 LAB — LIPID PANEL
CHOLESTEROL TOTAL: 151 mg/dL (ref 100–199)
Chol/HDL Ratio: 2.8 ratio units (ref 0.0–4.4)
HDL: 53 mg/dL (ref >39)
LDL CALC: 73 mg/dL (ref 0–99)
TRIGLYCERIDES: 124 mg/dL (ref 0–149)
VLDL CHOLESTEROL CAL: 25 mg/dL (ref 5–40)

## 2016-01-11 NOTE — Telephone Encounter (Signed)
Letter mailed with lab results and recommendations.

## 2016-01-11 NOTE — Progress Notes (Signed)
Patient aware.

## 2016-01-29 ENCOUNTER — Other Ambulatory Visit: Payer: Self-pay | Admitting: Family

## 2016-02-05 DIAGNOSIS — M25562 Pain in left knee: Secondary | ICD-10-CM | POA: Diagnosis not present

## 2016-02-05 DIAGNOSIS — M25561 Pain in right knee: Secondary | ICD-10-CM | POA: Diagnosis not present

## 2016-02-06 ENCOUNTER — Other Ambulatory Visit: Payer: Self-pay | Admitting: Family

## 2016-02-14 ENCOUNTER — Telehealth: Payer: Self-pay | Admitting: Family

## 2016-02-15 NOTE — Telephone Encounter (Signed)
Spoke to pt and will call and schedule eye exam

## 2016-02-19 ENCOUNTER — Other Ambulatory Visit: Payer: Self-pay | Admitting: Family

## 2016-03-25 ENCOUNTER — Other Ambulatory Visit: Payer: Self-pay | Admitting: Family

## 2016-03-28 DIAGNOSIS — Z923 Personal history of irradiation: Secondary | ICD-10-CM | POA: Diagnosis not present

## 2016-03-28 DIAGNOSIS — C773 Secondary and unspecified malignant neoplasm of axilla and upper limb lymph nodes: Secondary | ICD-10-CM | POA: Diagnosis not present

## 2016-03-28 DIAGNOSIS — Z9012 Acquired absence of left breast and nipple: Secondary | ICD-10-CM | POA: Diagnosis not present

## 2016-03-28 DIAGNOSIS — Z170 Estrogen receptor positive status [ER+]: Secondary | ICD-10-CM | POA: Diagnosis not present

## 2016-03-28 DIAGNOSIS — Z5181 Encounter for therapeutic drug level monitoring: Secondary | ICD-10-CM | POA: Diagnosis not present

## 2016-03-28 DIAGNOSIS — C50912 Malignant neoplasm of unspecified site of left female breast: Secondary | ICD-10-CM | POA: Diagnosis not present

## 2016-03-28 DIAGNOSIS — Z79899 Other long term (current) drug therapy: Secondary | ICD-10-CM | POA: Diagnosis not present

## 2016-03-28 DIAGNOSIS — Z9221 Personal history of antineoplastic chemotherapy: Secondary | ICD-10-CM | POA: Diagnosis not present

## 2016-04-11 DIAGNOSIS — E119 Type 2 diabetes mellitus without complications: Secondary | ICD-10-CM | POA: Diagnosis not present

## 2016-04-11 DIAGNOSIS — H40033 Anatomical narrow angle, bilateral: Secondary | ICD-10-CM | POA: Diagnosis not present

## 2016-04-11 LAB — HM DIABETES EYE EXAM

## 2016-05-21 ENCOUNTER — Other Ambulatory Visit: Payer: Self-pay | Admitting: Family

## 2016-06-07 ENCOUNTER — Other Ambulatory Visit: Payer: Self-pay | Admitting: Family

## 2016-06-24 ENCOUNTER — Other Ambulatory Visit: Payer: Self-pay | Admitting: Family

## 2016-06-29 ENCOUNTER — Other Ambulatory Visit: Payer: Self-pay | Admitting: Family

## 2016-07-10 ENCOUNTER — Ambulatory Visit (INDEPENDENT_AMBULATORY_CARE_PROVIDER_SITE_OTHER): Payer: BC Managed Care – PPO | Admitting: Family

## 2016-07-10 ENCOUNTER — Encounter: Payer: Self-pay | Admitting: Family

## 2016-07-10 VITALS — BP 130/75 | HR 69 | Temp 98.8°F | Ht 67.0 in | Wt 211.0 lb

## 2016-07-10 DIAGNOSIS — E119 Type 2 diabetes mellitus without complications: Secondary | ICD-10-CM | POA: Diagnosis not present

## 2016-07-10 DIAGNOSIS — E559 Vitamin D deficiency, unspecified: Secondary | ICD-10-CM | POA: Diagnosis not present

## 2016-07-10 DIAGNOSIS — E782 Mixed hyperlipidemia: Secondary | ICD-10-CM | POA: Diagnosis not present

## 2016-07-10 DIAGNOSIS — I10 Essential (primary) hypertension: Secondary | ICD-10-CM | POA: Diagnosis not present

## 2016-07-10 DIAGNOSIS — F411 Generalized anxiety disorder: Secondary | ICD-10-CM

## 2016-07-10 DIAGNOSIS — Z1211 Encounter for screening for malignant neoplasm of colon: Secondary | ICD-10-CM | POA: Diagnosis not present

## 2016-07-10 DIAGNOSIS — Z23 Encounter for immunization: Secondary | ICD-10-CM | POA: Diagnosis not present

## 2016-07-10 DIAGNOSIS — G4700 Insomnia, unspecified: Secondary | ICD-10-CM

## 2016-07-10 DIAGNOSIS — K5900 Constipation, unspecified: Secondary | ICD-10-CM

## 2016-07-10 LAB — BAYER DCA HB A1C WAIVED: HB A1C (BAYER DCA - WAIVED): 5.3 % (ref <7.0)

## 2016-07-10 NOTE — Patient Instructions (Signed)
Health Maintenance, Female Adopting a healthy lifestyle and getting preventive care can go a long way to promote health and wellness. Talk with your health care provider about what schedule of regular examinations is right for you. This is a good chance for you to check in with your provider about disease prevention and staying healthy. In between checkups, there are plenty of things you can do on your own. Experts have done a lot of research about which lifestyle changes and preventive measures are most likely to keep you healthy. Ask your health care provider for more information. WEIGHT AND DIET  Eat a healthy diet  Be sure to include plenty of vegetables, fruits, low-fat dairy products, and lean protein.  Do not eat a lot of foods high in solid fats, added sugars, or salt.  Get regular exercise. This is one of the most important things you can do for your health.  Most adults should exercise for at least 150 minutes each week. The exercise should increase your heart rate and make you sweat (moderate-intensity exercise).  Most adults should also do strengthening exercises at least twice a week. This is in addition to the moderate-intensity exercise.  Maintain a healthy weight  Body mass index (BMI) is a measurement that can be used to identify possible weight problems. It estimates body fat based on height and weight. Your health care provider can help determine your BMI and help you achieve or maintain a healthy weight.  For females 20 years of age and older:   A BMI below 18.5 is considered underweight.  A BMI of 18.5 to 24.9 is normal.  A BMI of 25 to 29.9 is considered overweight.  A BMI of 30 and above is considered obese.  Watch levels of cholesterol and blood lipids  You should start having your blood tested for lipids and cholesterol at 74 years of age, then have this test every 5 years.  You may need to have your cholesterol levels checked more often if:  Your lipid  or cholesterol levels are high.  You are older than 74 years of age.  You are at high risk for heart disease.  CANCER SCREENING   Lung Cancer  Lung cancer screening is recommended for adults 55-80 years old who are at high risk for lung cancer because of a history of smoking.  A yearly low-dose CT scan of the lungs is recommended for people who:  Currently smoke.  Have quit within the past 15 years.  Have at least a 30-pack-year history of smoking. A pack year is smoking an average of one pack of cigarettes a day for 1 year.  Yearly screening should continue until it has been 15 years since you quit.  Yearly screening should stop if you develop a health problem that would prevent you from having lung cancer treatment.  Breast Cancer  Practice breast self-awareness. This means understanding how your breasts normally appear and feel.  It also means doing regular breast self-exams. Let your health care provider know about any changes, no matter how small.  If you are in your 20s or 30s, you should have a clinical breast exam (CBE) by a health care provider every 1-3 years as part of a regular health exam.  If you are 40 or older, have a CBE every year. Also consider having a breast X-ray (mammogram) every year.  If you have a family history of breast cancer, talk to your health care provider about genetic screening.  If you   are at high risk for breast cancer, talk to your health care provider about having an MRI and a mammogram every year.  Breast cancer gene (BRCA) assessment is recommended for women who have family members with BRCA-related cancers. BRCA-related cancers include:  Breast.  Ovarian.  Tubal.  Peritoneal cancers.  Results of the assessment will determine the need for genetic counseling and BRCA1 and BRCA2 testing. Cervical Cancer Your health care provider may recommend that you be screened regularly for cancer of the pelvic organs (ovaries, uterus, and  vagina). This screening involves a pelvic examination, including checking for microscopic changes to the surface of your cervix (Pap test). You may be encouraged to have this screening done every 3 years, beginning at age 21.  For women ages 30-65, health care providers may recommend pelvic exams and Pap testing every 3 years, or they may recommend the Pap and pelvic exam, combined with testing for human papilloma virus (HPV), every 5 years. Some types of HPV increase your risk of cervical cancer. Testing for HPV may also be done on women of any age with unclear Pap test results.  Other health care providers may not recommend any screening for nonpregnant women who are considered low risk for pelvic cancer and who do not have symptoms. Ask your health care provider if a screening pelvic exam is right for you.  If you have had past treatment for cervical cancer or a condition that could lead to cancer, you need Pap tests and screening for cancer for at least 20 years after your treatment. If Pap tests have been discontinued, your risk factors (such as having a new sexual partner) need to be reassessed to determine if screening should resume. Some women have medical problems that increase the chance of getting cervical cancer. In these cases, your health care provider may recommend more frequent screening and Pap tests. Colorectal Cancer  This type of cancer can be detected and often prevented.  Routine colorectal cancer screening usually begins at 74 years of age and continues through 75 years of age.  Your health care provider may recommend screening at an earlier age if you have risk factors for colon cancer.  Your health care provider may also recommend using home test kits to check for hidden blood in the stool.  A small camera at the end of a tube can be used to examine your colon directly (sigmoidoscopy or colonoscopy). This is done to check for the earliest forms of colorectal  cancer.  Routine screening usually begins at age 50.  Direct examination of the colon should be repeated every 5-10 years through 75 years of age. However, you may need to be screened more often if early forms of precancerous polyps or small growths are found. Skin Cancer  Check your skin from head to toe regularly.  Tell your health care provider about any new moles or changes in moles, especially if there is a change in a mole's shape or color.  Also tell your health care provider if you have a mole that is larger than the size of a pencil eraser.  Always use sunscreen. Apply sunscreen liberally and repeatedly throughout the day.  Protect yourself by wearing long sleeves, pants, a wide-brimmed hat, and sunglasses whenever you are outside. HEART DISEASE, DIABETES, AND HIGH BLOOD PRESSURE   High blood pressure causes heart disease and increases the risk of stroke. High blood pressure is more likely to develop in:  People who have blood pressure in the high end   of the normal range (130-139/85-89 mm Hg).  People who are overweight or obese.  People who are African American.  If you are 38-23 years of age, have your blood pressure checked every 3-5 years. If you are 61 years of age or older, have your blood pressure checked every year. You should have your blood pressure measured twice--once when you are at a hospital or clinic, and once when you are not at a hospital or clinic. Record the average of the two measurements. To check your blood pressure when you are not at a hospital or clinic, you can use:  An automated blood pressure machine at a pharmacy.  A home blood pressure monitor.  If you are between 45 years and 39 years old, ask your health care provider if you should take aspirin to prevent strokes.  Have regular diabetes screenings. This involves taking a blood sample to check your fasting blood sugar level.  If you are at a normal weight and have a low risk for diabetes,  have this test once every three years after 74 years of age.  If you are overweight and have a high risk for diabetes, consider being tested at a younger age or more often. PREVENTING INFECTION  Hepatitis B  If you have a higher risk for hepatitis B, you should be screened for this virus. You are considered at high risk for hepatitis B if:  You were born in a country where hepatitis B is common. Ask your health care provider which countries are considered high risk.  Your parents were born in a high-risk country, and you have not been immunized against hepatitis B (hepatitis B vaccine).  You have HIV or AIDS.  You use needles to inject street drugs.  You live with someone who has hepatitis B.  You have had sex with someone who has hepatitis B.  You get hemodialysis treatment.  You take certain medicines for conditions, including cancer, organ transplantation, and autoimmune conditions. Hepatitis C  Blood testing is recommended for:  Everyone born from [birth year] through 1964.  Anyone with known risk factors for hepatitis C. Sexually transmitted infections (STIs)  You should be screened for sexually transmitted infections (STIs) including gonorrhea and chlamydia if:  You are sexually active and are younger than 74 years of age.  You are older than 74 years of age and your health care provider tells you that you are at risk for this type of infection.  Your sexual activity has changed since you were last screened and you are at an increased risk for chlamydia or gonorrhea. Ask your health care provider if you are at risk.  If you do not have HIV, but are at risk, it may be recommended that you take a prescription medicine daily to prevent HIV infection. This is called pre-exposure prophylaxis (PrEP). You are considered at risk if:  You are sexually active and do not regularly use condoms or know the HIV status of your partner(s).  You take drugs by injection.  You are sexually  active with a partner who has HIV. Talk with your health care provider about whether you are at high risk of being infected with HIV. If you choose to begin PrEP, you should first be tested for HIV. You should then be tested every 3 months for as long as you are taking PrEP.  PREGNANCY   If you are premenopausal and you may become pregnant, ask your health care provider about preconception counseling.  If you may  become pregnant, take 400 to 800 micrograms (mcg) of folic acid every day.  If you want to prevent pregnancy, talk to your health care provider about birth control (contraception). OSTEOPOROSIS AND MENOPAUSE   Osteoporosis is a disease in which the bones lose minerals and strength with aging. This can result in serious bone fractures. Your risk for osteoporosis can be identified using a bone density scan.  If you are 61 years of age or older, or if you are at risk for osteoporosis and fractures, ask your health care provider if you should be screened.  Ask your health care provider whether you should take a calcium or vitamin D supplement to lower your risk for osteoporosis.  Menopause may have certain physical symptoms and risks.  Hormone replacement therapy may reduce some of these symptoms and risks. Talk to your health care provider about whether hormone replacement therapy is right for you.  HOME CARE INSTRUCTIONS   Schedule regular health, dental, and eye exams.  Stay current with your immunizations.   Do not use any tobacco products including cigarettes, chewing tobacco, or electronic cigarettes.  If you are pregnant, do not drink alcohol.  If you are breastfeeding, limit how much and how often you drink alcohol.  Limit alcohol intake to no more than 1 drink per day for nonpregnant women. One drink equals 12 ounces of beer, 5 ounces of wine, or 1 ounces of hard liquor.  Do not use street drugs.  Do not share needles.  Ask your health care provider for help if  you need support or information about quitting drugs.  Tell your health care provider if you often feel depressed.  Tell your health care provider if you have ever been abused or do not feel safe at home.   This information is not intended to replace advice given to you by your health care provider. Make sure you discuss any questions you have with your health care provider.   Document Released: 03/18/2011 Document Revised: 09/23/2014 Document Reviewed: 08/04/2013 Elsevier Interactive Patient Education [employer].

## 2016-07-10 NOTE — Progress Notes (Signed)
Subjective:    Patient ID: Jeanette Daniels, female    DOB: [birth year], 75 y.o.   MRN: [id number]  Pt presents to the office today for chronic follow up.  Diabetes  She presents for her follow-up diabetic visit. She has type 2 diabetes mellitus. Her disease course has been stable. Hypoglycemia symptoms include nervousness/anxiousness. Pertinent negatives for hypoglycemia include no headaches or mood changes. Pertinent negatives for diabetes include no blurred vision, no chest pain, no foot paresthesias, no foot ulcerations, no polydipsia, no polyphagia, no polyuria and no visual change. Pertinent negatives for hypoglycemia complications include no blackouts and no hospitalization. Symptoms are stable. Pertinent negatives for diabetic complications include no autonomic neuropathy, CVA, heart disease, nephropathy or peripheral neuropathy. Risk factors for coronary artery disease include dyslipidemia, obesity, hypertension and post-menopausal. Current diabetic treatment includes oral agent (monotherapy). She is compliant with treatment all of the time. She is following a generally healthy diet. There is no change in her home blood glucose trend. Her breakfast blood glucose range is generally 110-130 mg/dl. An ACE inhibitor/angiotensin II receptor blocker is being taken. Eye exam is current (June 2017).  Hypertension  This is a chronic problem. The current episode started more than 1 year ago. The problem has been resolved since onset. The problem is controlled. Associated symptoms include anxiety. Pertinent negatives include no blurred vision, chest pain, headaches, palpitations, peripheral edema or shortness of breath. Risk factors for coronary artery disease include diabetes mellitus, dyslipidemia, female gender, post-menopausal state and sedentary lifestyle. Past treatments include angiotensin blockers. The current treatment provides significant improvement. There is no history of kidney disease, CAD/MI, CVA,  heart failure or a thyroid problem. There is no history of sleep apnea.  Hyperlipidemia  This is a chronic problem. The current episode started more than 1 year ago. The problem is controlled. Recent lipid tests were reviewed and are normal. Exacerbating diseases include diabetes and obesity. Pertinent negatives include no chest pain, leg pain or shortness of breath. Current antihyperlipidemic treatment includes diet change. The current treatment provides significant improvement of lipids. Risk factors for coronary artery disease include diabetes mellitus, dyslipidemia, hypertension, obesity and post-menopausal.  Anxiety  Presents for follow-up visit. Onset was more than 5 years ago. The problem has been waxing and waning. Symptoms include insomnia and nervous/anxious behavior. Patient reports no chest pain, depressed mood, nausea, palpitations or shortness of breath. Symptoms occur occasionally. The severity of symptoms is mild. The quality of sleep is good.   Her past medical history is significant for anxiety/panic attacks. There is no history of CAD, CHF or depression. Past treatments include SSRIs. The treatment provided significant relief. Compliance with prior treatments has been good.  Insomnia  Primary symptoms: sleep disturbance, difficulty falling asleep.  The current episode started more than one year. The onset quality is gradual. The problem occurs rarely. The problem has been waxing and waning since onset. Past treatments include meditation and medication. The treatment provided moderate relief.  Constipation  This is a chronic problem. The current episode started more than 1 year ago. The problem has been resolved since onset. Her stool frequency is 4 to 5 times per week. The patient is on a high fiber diet. She exercises regularly. There has been adequate water intake. Pertinent negatives include no diarrhea or nausea. She has tried diet changes and stool softeners for the symptoms. The  treatment provided moderate relief.      Review of Systems  Constitutional: Negative.   HENT: Negative.  Eyes: Negative.  Negative for blurred vision.  Respiratory: Negative.  Negative for shortness of breath.   Cardiovascular: Negative.  Negative for chest pain and palpitations.  Gastrointestinal: Positive for constipation. Negative for diarrhea and nausea.  Endocrine: Negative.  Negative for polydipsia, polyphagia and polyuria.  Genitourinary: Negative.   Musculoskeletal: Negative.   Neurological: Negative.  Negative for headaches.  Hematological: Negative.   Psychiatric/Behavioral: Positive for sleep disturbance. The patient is nervous/anxious and has insomnia.   All other systems reviewed and are negative.      Objective:   Physical Exam  Constitutional: She is oriented to person, place, and time. She appears well-developed and well-nourished. No distress.  HENT:  Head: Normocephalic and atraumatic.  Right Ear: External ear normal.  Left Ear: External ear normal.  Nose: Nose normal.  Mouth/Throat: Oropharynx is clear and moist.  Eyes: Pupils are equal, round, and reactive to light.  Neck: Normal range of motion. Neck supple. No thyromegaly present.  Cardiovascular: Normal rate, regular rhythm, normal heart sounds and intact distal pulses.   No murmur heard. Pulmonary/Chest: Effort normal and breath sounds normal. No respiratory distress. She has no wheezes.  Abdominal: Soft. Bowel sounds are normal. She exhibits no distension. There is no tenderness.  Musculoskeletal: Normal range of motion. She exhibits no edema or tenderness.  Neurological: She is alert and oriented to person, place, and time. She has normal reflexes. No cranial nerve deficit.  Skin: Skin is warm and dry.  Psychiatric: She has a normal mood and affect. Her behavior is normal. Judgment and thought content normal.  Vitals reviewed.     BP 130/75   Pulse 69   Temp 98.8 F (37.1 C) (Oral)   Ht 5'  7" (1.702 m)   Wt 211 lb (95.7 kg)   BMI 33.05 kg/m      Assessment & Plan:  1. Essential hypertension - CMP14+EGFR  2. Constipation, unspecified constipation type - CMP14+EGFR  3. Type 2 diabetes mellitus without complication, without long-term current use of insulin (HCC) - CMP14+EGFR - Bayer DCA Hb A1c Waived - Microalbumin / creatinine urine ratio  4. Vitamin D deficiency - CMP14+EGFR - VITAMIN D 25 Hydroxy (Vit-D Deficiency, Fractures)  5. Insomnia, unspecified type - CMP14+EGFR  6. Mixed hyperlipidemia - CMP14+EGFR - Lipid panel  7. GAD (generalized anxiety disorder)  - CMP14+EGFR  8. Colon cancer screening - CMP14+EGFR - Fecal occult blood, imunochemical; Future   Continue all meds Labs pending Health Maintenance reviewed Diet and exercise encouraged RTO 6 months  Evelina Dun, FNP

## 2016-07-11 LAB — MICROALBUMIN / CREATININE URINE RATIO
Creatinine, Urine: 113.6 mg/dL
Microalb/Creat Ratio: 9.3 mg/g creat (ref 0.0–30.0)
Microalbumin, Urine: 10.6 ug/mL

## 2016-07-11 LAB — CMP14+EGFR
ALK PHOS: 72 IU/L (ref 39–117)
ALT: 20 IU/L (ref 0–32)
AST: 18 IU/L (ref 0–40)
Albumin/Globulin Ratio: 1.7 (ref 1.2–2.2)
Albumin: 4.3 g/dL (ref 3.5–4.8)
BILIRUBIN TOTAL: 0.3 mg/dL (ref 0.0–1.2)
BUN / CREAT RATIO: 29 — AB (ref 12–28)
BUN: 20 mg/dL (ref 8–27)
CHLORIDE: 99 mmol/L (ref 96–106)
CO2: 26 mmol/L (ref 18–29)
CREATININE: 0.7 mg/dL (ref 0.57–1.00)
Calcium: 10.1 mg/dL (ref 8.7–10.3)
GFR calc Af Amer: 99 mL/min/{1.73_m2} (ref >59)
GFR calc non Af Amer: 86 mL/min/{1.73_m2} (ref >59)
GLUCOSE: 92 mg/dL (ref 65–99)
Globulin, Total: 2.6 g/dL (ref 1.5–4.5)
Potassium: 4.3 mmol/L (ref 3.5–5.2)
Sodium: 141 mmol/L (ref 134–144)
Total Protein: 6.9 g/dL (ref 6.0–8.5)

## 2016-07-11 LAB — VITAMIN D 25 HYDROXY (VIT D DEFICIENCY, FRACTURES): Vit D, 25-Hydroxy: 19.4 ng/mL — ABNORMAL LOW (ref 30.0–100.0)

## 2016-07-11 LAB — LIPID PANEL
CHOL/HDL RATIO: 4.8 ratio — AB (ref 0.0–4.4)
CHOLESTEROL TOTAL: 246 mg/dL — AB (ref 100–199)
HDL: 51 mg/dL (ref >39)
LDL CALC: 160 mg/dL — AB (ref 0–99)
TRIGLYCERIDES: 175 mg/dL — AB (ref 0–149)
VLDL CHOLESTEROL CAL: 35 mg/dL (ref 5–40)

## 2016-07-12 ENCOUNTER — Other Ambulatory Visit: Payer: Self-pay | Admitting: Family

## 2016-07-12 MED ORDER — ATORVASTATIN CALCIUM 20 MG PO TABS: 20.0000 mg | ORAL_TABLET | Freq: Every day | ORAL | 1 refills | Status: DC

## 2016-07-12 MED ORDER — VITAMIN D (ERGOCALCIFEROL) 1.25 MG (50000 UNIT) PO CAPS: 50000.0000 [IU] | ORAL_CAPSULE | ORAL | 3 refills | Status: DC

## 2016-07-19 ENCOUNTER — Ambulatory Visit: Payer: BC Managed Care – PPO | Admitting: Family

## 2016-07-23 ENCOUNTER — Other Ambulatory Visit: Payer: Self-pay | Admitting: Family

## 2016-07-27 ENCOUNTER — Other Ambulatory Visit: Payer: Self-pay | Admitting: Family

## 2016-08-05 ENCOUNTER — Other Ambulatory Visit: Payer: Self-pay | Admitting: Family

## 2016-08-12 ENCOUNTER — Other Ambulatory Visit: Payer: Self-pay | Admitting: Family

## 2016-08-12 MED ORDER — ZOLPIDEM TARTRATE 10 MG PO TABS: 5.0000 mg | ORAL_TABLET | Freq: Every day | ORAL | 1 refills | Status: DC

## 2016-08-12 NOTE — Telephone Encounter (Signed)
I do not see where this has ever been filled here? Route to pool A

## 2016-08-12 NOTE — Telephone Encounter (Signed)
Patient aware that rx called into pharmacy.

## 2016-08-30 ENCOUNTER — Encounter: Payer: Self-pay | Admitting: Family Medicine

## 2016-08-30 ENCOUNTER — Ambulatory Visit (INDEPENDENT_AMBULATORY_CARE_PROVIDER_SITE_OTHER): Payer: BC Managed Care – PPO | Admitting: Family Medicine

## 2016-08-30 VITALS — BP 117/68 | HR 64 | Temp 98.0°F | Ht 67.0 in | Wt 209.5 lb

## 2016-08-30 DIAGNOSIS — J029 Acute pharyngitis, unspecified: Secondary | ICD-10-CM | POA: Diagnosis not present

## 2016-08-30 LAB — VERITOR FLU A/B WAIVED
INFLUENZA A: NEGATIVE
INFLUENZA B: NEGATIVE

## 2016-08-30 MED ORDER — AZITHROMYCIN 250 MG PO TABS: ORAL_TABLET | ORAL | 0 refills | Status: DC

## 2016-08-30 NOTE — Progress Notes (Signed)
BP 117/68   Pulse 64   Temp 98 F (36.7 C) (Oral)   Ht 5\' 7"  (1.702 m)   Wt 209 lb 8 oz (95 kg)   BMI 32.81 kg/m    Subjective:    Patient ID: Jeanette Daniels, female    DOB: 10/24/1940, 75 y.o.   MRN: [id number]  HPI: Jeanette Daniels is a 75 y.o. female presenting on 08/30/2016 for Fever (101.7 this morning) and Sinusitis (sore throat, sinus drainage and congestion; has been sick for about 2 weeks)   HPI Fever and congestion and sinus drainage and sore throat Patient has been having fever and congestion and sinus drainage and sore throat that's been going on for the past day. Prior to that she had been having a cough and cold and runny nose that she's had for 2 weeks but the fever just developed in the past day or 2. 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 She says the biggest thing is she woke up yesterday morning with a sore throat and a fever along 101 and then again today this morning she woke up with 101.7. She took Tylenol and her fever came back down to 98 here in the office. She denies any shortness of breath or wheezing. She has been having a cough that is been productive of yellow-green sputum but she doesn't always have a cough and it is less frequent.  Relevant past medical, surgical, family and social history reviewed and updated as indicated. Interim medical history since our last visit reviewed. Allergies and medications reviewed and updated.  Review of Systems  Constitutional: Positive for chills and fever.  HENT: Positive for congestion, postnasal drip, rhinorrhea, sinus pressure, sneezing and sore throat. Negative for ear discharge and ear pain.   Eyes: Negative for pain, redness and visual disturbance.  Respiratory: Positive for cough. Negative for chest tightness, shortness of breath and wheezing.   Cardiovascular: Negative for chest pain and leg swelling.  Genitourinary: Negative for difficulty urinating and  dysuria.  Musculoskeletal: Negative for back pain and gait problem.  Skin: Negative for rash.  Neurological: Negative for light-headedness and headaches.  Psychiatric/Behavioral: Negative for agitation and behavioral problems.  All other systems reviewed and are negative.   Per HPI unless specifically indicated above     Objective:    BP 117/68   Pulse 64   Temp 98 F (36.7 C) (Oral)   Ht 5\' 7"  (1.702 m)   Wt 209 lb 8 oz (95 kg)   BMI 32.81 kg/m   Wt Readings from Last 3 Encounters:  08/30/16 209 lb 8 oz (95 kg)  07/10/16 211 lb (95.7 kg)  01/09/16 209 lb 12.8 oz (95.2 kg)    Physical Exam  Constitutional: She is oriented to person, place, and time. She appears well-developed and well-nourished. No distress.  HENT:  Right Ear: Tympanic membrane, external ear and ear canal normal.  Left Ear: Tympanic membrane, external ear and ear canal normal.  Nose: Mucosal edema and rhinorrhea present. No epistaxis. Right sinus exhibits no maxillary sinus tenderness and no frontal sinus tenderness. Left sinus exhibits no maxillary sinus tenderness and no frontal sinus tenderness.  Mouth/Throat: Uvula is midline and mucous membranes are normal. Posterior oropharyngeal edema and posterior oropharyngeal erythema present. No oropharyngeal exudate or tonsillar abscesses.  Eyes: Conjunctivae and EOM are normal.  Cardiovascular: Normal rate, regular rhythm, normal heart sounds and intact distal pulses.   No  murmur heard. Pulmonary/Chest: Effort normal and breath sounds normal. No respiratory distress. She has no wheezes. She has no rales.  Musculoskeletal: Normal range of motion. She exhibits no edema or tenderness.  Neurological: She is alert and oriented to person, place, and time. Coordination normal.  Skin: Skin is warm and dry. No rash noted. She is not diaphoretic.  Psychiatric: She has a normal mood and affect. Her behavior is normal.  Vitals reviewed.   Influenza A and B: Negative      Assessment & Plan:   Problem List Items Addressed This Visit    None    Visit Diagnoses    Acute pharyngitis, unspecified etiology    -  Primary   Recommend Flonase and Mucinex and nasal saline, if fever not improved in the next day or 2 azithromycin is therefore her   Relevant Medications   azithromycin (ZITHROMAX) 250 MG tablet   Other Relevant Orders   Veritor Flu A/B Waived       Follow up plan: Return if symptoms worsen or fail to improve.  Counseling provided for all of the vaccine components Orders Placed This Encounter  Procedures  . Veritor Flu A/B [place], MD [facility] 08/30/2016, 2:18 PM

## 2016-10-08 ENCOUNTER — Other Ambulatory Visit: Payer: Self-pay | Admitting: Family

## 2016-10-14 ENCOUNTER — Other Ambulatory Visit: Payer: Self-pay | Admitting: Family

## 2016-11-15 ENCOUNTER — Other Ambulatory Visit: Payer: Self-pay | Admitting: Family

## 2016-12-13 ENCOUNTER — Other Ambulatory Visit: Payer: Self-pay | Admitting: Family

## 2016-12-21 ENCOUNTER — Other Ambulatory Visit: Payer: Self-pay | Admitting: Family

## 2016-12-23 ENCOUNTER — Other Ambulatory Visit: Payer: Self-pay | Admitting: Family

## 2016-12-23 NOTE — Telephone Encounter (Signed)
Sorry I accidentally sent this to you  I know that it should go to PCP

## 2016-12-23 NOTE — Telephone Encounter (Signed)
Rx called to pharmacy

## 2016-12-23 NOTE — Telephone Encounter (Signed)
Patient saw me last for an acute visit but is actually assigned to Jeanette Daniels is her PCP and sees her for regular visits. Please forward this prescription request to [facility]. All prescription request should go to PCP unless they are not here

## 2017-01-06 ENCOUNTER — Other Ambulatory Visit: Payer: Self-pay | Admitting: Family

## 2017-01-09 ENCOUNTER — Ambulatory Visit (INDEPENDENT_AMBULATORY_CARE_PROVIDER_SITE_OTHER): Payer: BC Managed Care – PPO | Admitting: Family

## 2017-01-09 ENCOUNTER — Encounter: Payer: Self-pay | Admitting: Family

## 2017-01-09 VITALS — BP 123/84 | HR 74 | Temp 97.9°F | Ht 67.0 in | Wt 213.0 lb

## 2017-01-09 DIAGNOSIS — I10 Essential (primary) hypertension: Secondary | ICD-10-CM

## 2017-01-09 DIAGNOSIS — Z1211 Encounter for screening for malignant neoplasm of colon: Secondary | ICD-10-CM | POA: Diagnosis not present

## 2017-01-09 DIAGNOSIS — G4700 Insomnia, unspecified: Secondary | ICD-10-CM

## 2017-01-09 DIAGNOSIS — E782 Mixed hyperlipidemia: Secondary | ICD-10-CM | POA: Diagnosis not present

## 2017-01-09 DIAGNOSIS — K5900 Constipation, unspecified: Secondary | ICD-10-CM

## 2017-01-09 DIAGNOSIS — F411 Generalized anxiety disorder: Secondary | ICD-10-CM | POA: Diagnosis not present

## 2017-01-09 DIAGNOSIS — E119 Type 2 diabetes mellitus without complications: Secondary | ICD-10-CM | POA: Diagnosis not present

## 2017-01-09 DIAGNOSIS — E559 Vitamin D deficiency, unspecified: Secondary | ICD-10-CM | POA: Diagnosis not present

## 2017-01-09 DIAGNOSIS — E669 Obesity, unspecified: Secondary | ICD-10-CM

## 2017-01-09 LAB — BAYER DCA HB A1C WAIVED: HB A1C (BAYER DCA - WAIVED): 6 % (ref <7.0)

## 2017-01-09 NOTE — Progress Notes (Signed)
Subjective:    Patient ID: Jeanette Daniels, female    DOB: 11/10/1940, 76 y.o.   MRN: [id number]  Pt presents to the office today for chronic follow up.  Hypertension  This is a chronic problem. The current episode started more than 1 year ago. The problem has been resolved since onset. The problem is controlled. Associated symptoms include anxiety. Pertinent negatives include no blurred vision, peripheral edema or shortness of breath. Risk factors for coronary artery disease include dyslipidemia, post-menopausal state, obesity and sedentary lifestyle. The current treatment provides moderate improvement.  Hyperlipidemia  This is a chronic problem. The current episode started more than 1 year ago. The problem is uncontrolled. Recent lipid tests were reviewed and are high. Exacerbating diseases include obesity. Pertinent negatives include no shortness of breath. Current antihyperlipidemic treatment includes statins. The current treatment provides no improvement of lipids. Risk factors for coronary artery disease include dyslipidemia, obesity, hypertension, a sedentary lifestyle and post-menopausal.  Diabetes  She presents for her follow-up diabetic visit. She has type 2 diabetes mellitus. Hypoglycemia symptoms include nervousness/anxiousness. Pertinent negatives for diabetes include no blurred vision, no foot paresthesias and no visual change. She is following a diabetic diet. Her breakfast blood glucose range is generally 110-130 mg/dl. Eye exam is current.  Anxiety  Presents for follow-up visit. Symptoms include depressed mood, excessive worry, insomnia and nervous/anxious behavior. Patient reports no shortness of breath. Symptoms occur rarely.    Constipation  This is a chronic problem. The current episode started more than 1 year ago. The problem has been resolved since onset. Her stool frequency is 1 time per day.  Insomnia  Primary symptoms: difficulty falling asleep, frequent awakening.  The  current episode started more than one year. The onset quality is gradual. The problem has been waxing and waning since onset. The treatment provided moderate relief.      Review of Systems  Eyes: Negative for blurred vision.  Respiratory: Negative for shortness of breath.   Gastrointestinal: Positive for constipation.  Psychiatric/Behavioral: The patient is nervous/anxious and has insomnia.   All other systems reviewed and are negative.      Objective:   Physical Exam  Constitutional: She is oriented to person, place, and time. She appears well-developed and well-nourished. No distress.  HENT:  Head: Normocephalic and atraumatic.  Right Ear: External ear normal.  Left Ear: External ear normal.  Nose: Nose normal.  Mouth/Throat: Oropharynx is clear and moist.  Eyes: Pupils are equal, round, and reactive to light.  Neck: Normal range of motion. Neck supple. No thyromegaly present.  Cardiovascular: Normal rate, regular rhythm, normal heart sounds and intact distal pulses.   No murmur heard. Pulmonary/Chest: Effort normal and breath sounds normal. No respiratory distress. She has no wheezes.  Abdominal: Soft. Bowel sounds are normal. She exhibits no distension. There is no tenderness.  Musculoskeletal: Normal range of motion. She exhibits no edema or tenderness.  Neurological: She is alert and oriented to person, place, and time. She has normal reflexes. No cranial nerve deficit.  Skin: Skin is warm and dry.  Psychiatric: She has a normal mood and affect. Her behavior is normal. Judgment and thought content normal.  Vitals reviewed.  Diabetic Foot Exam - Simple   Simple Foot Form Diabetic Foot exam was performed with the following findings:  Yes 01/09/2017  4:26 PM  Visual Inspection No deformities, no ulcerations, no other skin breakdown bilaterally:  Yes Sensation Testing Intact to touch and monofilament testing bilaterally:  Yes Pulse  Check Posterior Tibialis and Dorsalis  pulse intact bilaterally:  Yes Comments      BP 123/84   Pulse 74   Temp 97.9 F (36.6 C) (Oral)   Ht '5\' 7"'  (1.702 m)   Wt 213 lb (96.6 kg)   BMI 33.36 kg/m      Assessment & Plan:  1. Essential hypertension - CMP14+EGFR  2. Constipation, unspecified constipation type - CMP14+EGFR  3. Type 2 diabetes mellitus without complication, without long-term current use of insulin (HCC)  - CMP14+EGFR - Bayer DCA Hb A1c Waived  4. GAD (generalized anxiety disorder) - CMP14+EGFR  5. Mixed hyperlipidemia - CMP14+EGFR - Lipid panel  6. Insomnia, unspecified type - CMP14+EGFR  7. Vitamin D deficiency  - CMP14+EGFR  8. Obesity (BMI 30-39.9) - CMP14+EGFR  9. Colon cancer screening - CMP14+EGFR - Fecal occult blood, imunochemical; Future   Continue all meds Labs pending Health Maintenance reviewed Diet and exercise encouraged RTO 6 months   Evelina Dun, FNP

## 2017-01-09 NOTE — Patient Instructions (Signed)
Health Maintenance, Female Adopting a healthy lifestyle and getting preventive care can go a long way to promote health and wellness. Talk with your health care provider about what schedule of regular examinations is right for you. This is a good chance for you to check in with your provider about disease prevention and staying healthy. In between checkups, there are plenty of things you can do on your own. Experts have done a lot of research about which lifestyle changes and preventive measures are most likely to keep you healthy. Ask your health care provider for more information. Weight and diet Eat a healthy diet  Be sure to include plenty of vegetables, fruits, low-fat dairy products, and lean protein.  Do not eat a lot of foods high in solid fats, added sugars, or salt.  Get regular exercise. This is one of the most important things you can do for your health.  Most adults should exercise for at least 150 minutes each week. The exercise should increase your heart rate and make you sweat (moderate-intensity exercise).  Most adults should also do strengthening exercises at least twice a week. This is in addition to the moderate-intensity exercise. Maintain a healthy weight  Body mass index (BMI) is a measurement that can be used to identify possible weight problems. It estimates body fat based on height and weight. Your health care provider can help determine your BMI and help you achieve or maintain a healthy weight.  For females 75 years of age and older:  A BMI below 18.5 is considered underweight.  A BMI of 18.5 to 24.9 is normal.  A BMI of 25 to 29.9 is considered overweight.  A BMI of 30 and above is considered obese. Watch levels of cholesterol and blood lipids  You should start having your blood tested for lipids and cholesterol at 75 years of age, then have this test every 5 years.  You may need to have your cholesterol levels checked more often if:  Your lipid or  cholesterol levels are high.  You are older than 75 years of age.  You are at high risk for heart disease. Cancer screening Lung Cancer  Lung cancer screening is recommended for adults 75-42 years old who are at high risk for lung cancer because of a history of smoking.  A yearly low-dose CT scan of the lungs is recommended for people who:  Currently smoke.  Have quit within the past 15 years.  Have at least a 30-pack-year history of smoking. A pack year is smoking an average of one pack of cigarettes a day for 1 year.  Yearly screening should continue until it has been 15 years since you quit.  Yearly screening should stop if you develop a health problem that would prevent you from having lung cancer treatment. Breast Cancer  Practice breast self-awareness. This means understanding how your breasts normally appear and feel.  It also means doing regular breast self-exams. Let your health care provider know about any changes, no matter how small.  If you are in your 20s or 30s, you should have a clinical breast exam (CBE) by a health care provider every 1-3 years as part of a regular health exam.  If you are 34 or older, have a CBE every year. Also consider having a breast X-ray (mammogram) every year.  If you have a family history of breast cancer, talk to your health care provider about genetic screening.  If you are at high risk for breast cancer, talk  to your health care provider about having an MRI and a mammogram every year.  Breast cancer gene (BRCA) assessment is recommended for women who have family members with BRCA-related cancers. BRCA-related cancers include:  Breast.  Ovarian.  Tubal.  Peritoneal cancers.  Results of the assessment will determine the need for genetic counseling and BRCA1 and BRCA2 testing. Cervical Cancer  Your health care provider may recommend that you be screened regularly for cancer of the pelvic organs (ovaries, uterus, and vagina).  This screening involves a pelvic examination, including checking for microscopic changes to the surface of your cervix (Pap test). You may be encouraged to have this screening done every 3 years, beginning at age 24.  For women ages 66-65, health care providers may recommend pelvic exams and Pap testing every 3 years, or they may recommend the Pap and pelvic exam, combined with testing for human papilloma virus (HPV), every 5 years. Some types of HPV increase your risk of cervical cancer. Testing for HPV may also be done on women of any age with unclear Pap test results.  Other health care providers may not recommend any screening for nonpregnant women who are considered low risk for pelvic cancer and who do not have symptoms. Ask your health care provider if a screening pelvic exam is right for you.  If you have had past treatment for cervical cancer or a condition that could lead to cancer, you need Pap tests and screening for cancer for at least 20 years after your treatment. If Pap tests have been discontinued, your risk factors (such as having a new sexual partner) need to be reassessed to determine if screening should resume. Some women have medical problems that increase the chance of getting cervical cancer. In these cases, your health care provider may recommend more frequent screening and Pap tests. Colorectal Cancer  This type of cancer can be detected and often prevented.  Routine colorectal cancer screening usually begins at 75 years of age and continues through 75 years of age.  Your health care provider may recommend screening at an earlier age if you have risk factors for colon cancer.  Your health care provider may also recommend using home test kits to check for hidden blood in the stool.  A small camera at the end of a tube can be used to examine your colon directly (sigmoidoscopy or colonoscopy). This is done to check for the earliest forms of colorectal cancer.  Routine  screening usually begins at age 41.  Direct examination of the colon should be repeated every 5-10 years through 75 years of age. However, you may need to be screened more often if early forms of precancerous polyps or small growths are found. Skin Cancer  Check your skin from head to toe regularly.  Tell your health care provider about any new moles or changes in moles, especially if there is a change in a mole's shape or color.  Also tell your health care provider if you have a mole that is larger than the size of a pencil eraser.  Always use sunscreen. Apply sunscreen liberally and repeatedly throughout the day.  Protect yourself by wearing long sleeves, pants, a wide-brimmed hat, and sunglasses whenever you are outside. Heart disease, diabetes, and high blood pressure  High blood pressure causes heart disease and increases the risk of stroke. High blood pressure is more likely to develop in:  People who have blood pressure in the high end of the normal range (130-139/85-89 mm Hg).  People who are overweight or obese.  People who are African American.  If you are 59-24 years of age, have your blood pressure checked every 3-5 years. If you are 34 years of age or older, have your blood pressure checked every year. You should have your blood pressure measured twice-once when you are at a hospital or clinic, and once when you are not at a hospital or clinic. Record the average of the two measurements. To check your blood pressure when you are not at a hospital or clinic, you can use:  An automated blood pressure machine at a pharmacy.  A home blood pressure monitor.  If you are between 29 years and 60 years old, ask your health care provider if you should take aspirin to prevent strokes.  Have regular diabetes screenings. This involves taking a blood sample to check your fasting blood sugar level.  If you are at a normal weight and have a low risk for diabetes, have this test once  every three years after 75 years of age.  If you are overweight and have a high risk for diabetes, consider being tested at a younger age or more often. Preventing infection Hepatitis B  If you have a higher risk for hepatitis B, you should be screened for this virus. You are considered at high risk for hepatitis B if:  You were born in a country where hepatitis B is common. Ask your health care provider which countries are considered high risk.  Your parents were born in a high-risk country, and you have not been immunized against hepatitis B (hepatitis B vaccine).  You have HIV or AIDS.  You use needles to inject street drugs.  You live with someone who has hepatitis B.  You have had sex with someone who has hepatitis B.  You get hemodialysis treatment.  You take certain medicines for conditions, including cancer, organ transplantation, and autoimmune conditions. Hepatitis C  Blood testing is recommended for:  Everyone born from [birth year] through 1964.  Anyone with known risk factors for hepatitis C. Sexually transmitted infections (STIs)  You should be screened for sexually transmitted infections (STIs) including gonorrhea and chlamydia if:  You are sexually active and are younger than 75 years of age.  You are older than 75 years of age and your health care provider tells you that you are at risk for this type of infection.  Your sexual activity has changed since you were last screened and you are at an increased risk for chlamydia or gonorrhea. Ask your health care provider if you are at risk.  If you do not have HIV, but are at risk, it may be recommended that you take a prescription medicine daily to prevent HIV infection. This is called pre-exposure prophylaxis (PrEP). You are considered at risk if:  You are sexually active and do not regularly use condoms or know the HIV status of your partner(s).  You take drugs by injection.  You are sexually active with a partner  who has HIV. Talk with your health care provider about whether you are at high risk of being infected with HIV. If you choose to begin PrEP, you should first be tested for HIV. You should then be tested every 3 months for as long as you are taking PrEP. Pregnancy  If you are premenopausal and you may become pregnant, ask your health care provider about preconception counseling.  If you may become pregnant, take 400 to 800 micrograms (mcg) of folic acid  every day.  If you want to prevent pregnancy, talk to your health care provider about birth control (contraception). Osteoporosis and menopause  Osteoporosis is a disease in which the bones lose minerals and strength with aging. This can result in serious bone fractures. Your risk for osteoporosis can be identified using a bone density scan.  If you are 4 years of age or older, or if you are at risk for osteoporosis and fractures, ask your health care provider if you should be screened.  Ask your health care provider whether you should take a calcium or vitamin D supplement to lower your risk for osteoporosis.  Menopause may have certain physical symptoms and risks.  Hormone replacement therapy may reduce some of these symptoms and risks. Talk to your health care provider about whether hormone replacement therapy is right for you. Follow these instructions at home:  Schedule regular health, dental, and eye exams.  Stay current with your immunizations.  Do not use any tobacco products including cigarettes, chewing tobacco, or electronic cigarettes.  If you are pregnant, do not drink alcohol.  If you are breastfeeding, limit how much and how often you drink alcohol.  Limit alcohol intake to no more than 1 drink per day for nonpregnant women. One drink equals 12 ounces of beer, 5 ounces of wine, or 1 ounces of hard liquor.  Do not use street drugs.  Do not share needles.  Ask your health care provider for help if you need support  or information about quitting drugs.  Tell your health care provider if you often feel depressed.  Tell your health care provider if you have ever been abused or do not feel safe at home. This information is not intended to replace advice given to you by your health care provider. Make sure you discuss any questions you have with your health care provider. Document Released: 03/18/2011 Document Revised: 02/08/2016 Document Reviewed: 06/06/2015 Elsevier Interactive Patient Education  2017 [employer].

## 2017-01-10 LAB — CMP14+EGFR
ALK PHOS: 63 IU/L (ref 39–117)
ALT: 14 IU/L (ref 0–32)
AST: 17 IU/L (ref 0–40)
Albumin/Globulin Ratio: 1.6 (ref 1.2–2.2)
Albumin: 4.3 g/dL (ref 3.5–4.8)
BUN/Creatinine Ratio: 29 — ABNORMAL HIGH (ref 12–28)
BUN: 20 mg/dL (ref 8–27)
Bilirubin Total: 0.3 mg/dL (ref 0.0–1.2)
CALCIUM: 10.1 mg/dL (ref 8.7–10.3)
CO2: 26 mmol/L (ref 18–29)
CREATININE: 0.68 mg/dL (ref 0.57–1.00)
Chloride: 100 mmol/L (ref 96–106)
GFR calc Af Amer: 100 mL/min/{1.73_m2} (ref >59)
GFR, EST NON AFRICAN AMERICAN: 86 mL/min/{1.73_m2} (ref >59)
GLUCOSE: 88 mg/dL (ref 65–99)
Globulin, Total: 2.7 g/dL (ref 1.5–4.5)
Potassium: 4.8 mmol/L (ref 3.5–5.2)
Sodium: 139 mmol/L (ref 134–144)
Total Protein: 7 g/dL (ref 6.0–8.5)

## 2017-01-10 LAB — LIPID PANEL
CHOL/HDL RATIO: 3 ratio (ref 0.0–4.4)
CHOLESTEROL TOTAL: 153 mg/dL (ref 100–199)
HDL: 51 mg/dL (ref >39)
LDL CALC: 87 mg/dL (ref 0–99)
TRIGLYCERIDES: 74 mg/dL (ref 0–149)
VLDL CHOLESTEROL CAL: 15 mg/dL (ref 5–40)

## 2017-01-18 ENCOUNTER — Other Ambulatory Visit: Payer: Self-pay | Admitting: Family Medicine

## 2017-01-20 ENCOUNTER — Telehealth: Payer: Self-pay | Admitting: *Deleted

## 2017-01-20 NOTE — Telephone Encounter (Signed)
Aware of current lab results. 

## 2017-01-22 ENCOUNTER — Other Ambulatory Visit: Payer: Self-pay | Admitting: Family

## 2017-01-27 ENCOUNTER — Ambulatory Visit: Payer: BC Managed Care – PPO

## 2017-02-27 DIAGNOSIS — M25561 Pain in right knee: Secondary | ICD-10-CM | POA: Diagnosis not present

## 2017-03-03 DIAGNOSIS — Z1231 Encounter for screening mammogram for malignant neoplasm of breast: Secondary | ICD-10-CM | POA: Diagnosis not present

## 2017-03-06 ENCOUNTER — Other Ambulatory Visit: Payer: Self-pay | Admitting: Family Medicine

## 2017-03-25 ENCOUNTER — Telehealth: Payer: Self-pay | Admitting: Family

## 2017-03-25 MED ORDER — ZOLPIDEM TARTRATE 10 MG PO TABS: 10.0000 mg | ORAL_TABLET | Freq: Every day | ORAL | 5 refills | Status: DC

## 2017-03-25 NOTE — Telephone Encounter (Signed)
Rx called into the pharmacy and left message on pt VM stating requested rx called into pharmacy and to call back with any further questions or concerns.

## 2017-03-25 NOTE — Telephone Encounter (Signed)
We will increase back to 10 mg. Please call rx in. Thanks

## 2017-03-31 DIAGNOSIS — Z170 Estrogen receptor positive status [ER+]: Secondary | ICD-10-CM | POA: Diagnosis not present

## 2017-03-31 DIAGNOSIS — Z9012 Acquired absence of left breast and nipple: Secondary | ICD-10-CM | POA: Diagnosis not present

## 2017-03-31 DIAGNOSIS — C50912 Malignant neoplasm of unspecified site of left female breast: Secondary | ICD-10-CM | POA: Diagnosis not present

## 2017-03-31 DIAGNOSIS — Z803 Family history of malignant neoplasm of breast: Secondary | ICD-10-CM | POA: Diagnosis not present

## 2017-03-31 DIAGNOSIS — Z5181 Encounter for therapeutic drug level monitoring: Secondary | ICD-10-CM | POA: Diagnosis not present

## 2017-03-31 DIAGNOSIS — Z79811 Long term (current) use of aromatase inhibitors: Secondary | ICD-10-CM | POA: Diagnosis not present

## 2017-03-31 DIAGNOSIS — C773 Secondary and unspecified malignant neoplasm of axilla and upper limb lymph nodes: Secondary | ICD-10-CM | POA: Diagnosis not present

## 2017-03-31 DIAGNOSIS — Z923 Personal history of irradiation: Secondary | ICD-10-CM | POA: Diagnosis not present

## 2017-04-07 ENCOUNTER — Other Ambulatory Visit: Payer: Self-pay | Admitting: Family Medicine

## 2017-04-18 ENCOUNTER — Other Ambulatory Visit: Payer: Self-pay | Admitting: Family

## 2017-04-21 NOTE — Telephone Encounter (Signed)
Last seen 01/09/17  Christy  Requesting 90 day supply

## 2017-04-28 ENCOUNTER — Other Ambulatory Visit: Payer: Self-pay | Admitting: Family

## 2017-06-11 ENCOUNTER — Other Ambulatory Visit: Payer: Self-pay | Admitting: Family

## 2017-06-12 ENCOUNTER — Other Ambulatory Visit: Payer: Self-pay | Admitting: Family

## 2017-06-13 NOTE — Telephone Encounter (Signed)
Last seen 01/09/17  [facility]

## 2017-07-07 ENCOUNTER — Other Ambulatory Visit: Payer: Self-pay | Admitting: Family

## 2017-07-11 ENCOUNTER — Ambulatory Visit (INDEPENDENT_AMBULATORY_CARE_PROVIDER_SITE_OTHER): Payer: BC Managed Care – PPO | Admitting: Family

## 2017-07-11 ENCOUNTER — Encounter: Payer: Self-pay | Admitting: Family

## 2017-07-11 VITALS — BP 136/77 | HR 72 | Temp 97.5°F | Ht 67.0 in | Wt 208.0 lb

## 2017-07-11 DIAGNOSIS — E119 Type 2 diabetes mellitus without complications: Secondary | ICD-10-CM

## 2017-07-11 DIAGNOSIS — Z23 Encounter for immunization: Secondary | ICD-10-CM | POA: Diagnosis not present

## 2017-07-11 DIAGNOSIS — G4700 Insomnia, unspecified: Secondary | ICD-10-CM

## 2017-07-11 DIAGNOSIS — I152 Hypertension secondary to endocrine disorders: Secondary | ICD-10-CM

## 2017-07-11 DIAGNOSIS — E669 Obesity, unspecified: Secondary | ICD-10-CM

## 2017-07-11 DIAGNOSIS — E559 Vitamin D deficiency, unspecified: Secondary | ICD-10-CM | POA: Diagnosis not present

## 2017-07-11 DIAGNOSIS — K5900 Constipation, unspecified: Secondary | ICD-10-CM | POA: Diagnosis not present

## 2017-07-11 DIAGNOSIS — Z1211 Encounter for screening for malignant neoplasm of colon: Secondary | ICD-10-CM

## 2017-07-11 DIAGNOSIS — E1159 Type 2 diabetes mellitus with other circulatory complications: Secondary | ICD-10-CM

## 2017-07-11 DIAGNOSIS — I10 Essential (primary) hypertension: Secondary | ICD-10-CM | POA: Diagnosis not present

## 2017-07-11 DIAGNOSIS — E1169 Type 2 diabetes mellitus with other specified complication: Secondary | ICD-10-CM

## 2017-07-11 DIAGNOSIS — F411 Generalized anxiety disorder: Secondary | ICD-10-CM | POA: Diagnosis not present

## 2017-07-11 DIAGNOSIS — E785 Hyperlipidemia, unspecified: Secondary | ICD-10-CM | POA: Diagnosis not present

## 2017-07-11 MED ORDER — SERTRALINE HCL 100 MG PO TABS
200.0000 mg | ORAL_TABLET | Freq: Every day | ORAL | 1 refills | Status: DC
Start: 2017-07-11 — End: 2017-10-13

## 2017-07-11 NOTE — Progress Notes (Signed)
Subjective:    Patient ID: Jeanette Daniels, female    DOB: 1940-10-06, 76 y.o.   MRN: [id number]  Pt presents to the office today for chronic follow up.  Diabetes  She presents for her follow-up diabetic visit. She has type 2 diabetes mellitus. Her disease course has been stable. Hypoglycemia symptoms include nervousness/anxiousness. Pertinent negatives for hypoglycemia include no confusion or hunger. Pertinent negatives for diabetes include no blurred vision and no visual change. There are no hypoglycemic complications. Symptoms are stable. There are no diabetic complications. Pertinent negatives for diabetic complications include no CVA or heart disease. Risk factors for coronary artery disease include dyslipidemia, diabetes mellitus, obesity, post-menopausal and sedentary lifestyle. She is following a generally healthy diet. Her breakfast blood glucose range is generally 110-130 mg/dl. Eye exam is not current.  Hypertension  This is a chronic problem. The current episode started more than 1 year ago. The problem has been resolved since onset. The problem is controlled. Associated symptoms include anxiety and malaise/fatigue. Pertinent negatives include no blurred vision, peripheral edema or shortness of breath. Risk factors for coronary artery disease include diabetes mellitus, dyslipidemia, post-menopausal state and sedentary lifestyle. The current treatment provides moderate improvement. There is no history of kidney disease, CAD/MI, CVA or heart failure.  Hyperlipidemia  This is a chronic problem. The current episode started more than 1 year ago. The problem is controlled. Recent lipid tests were reviewed and are normal. Exacerbating diseases include obesity. Pertinent negatives include no shortness of breath. Current antihyperlipidemic treatment includes statins. The current treatment provides moderate improvement of lipids. Risk factors for coronary artery disease include diabetes mellitus,  dyslipidemia, obesity, hypertension, a sedentary lifestyle and post-menopausal.  Constipation  This is a chronic problem. The current episode started more than 1 year ago. The problem has been resolved since onset. Her stool frequency is 1 time per day. She has tried laxatives for the symptoms. The treatment provided moderate relief.  Insomnia  Primary symptoms: difficulty falling asleep, malaise/fatigue.  The onset quality is gradual. The problem occurs intermittently. The problem has been waxing and waning since onset. The treatment provided mild relief.  Anxiety  Presents for follow-up visit. Symptoms include excessive worry, insomnia, nervous/anxious behavior and restlessness. Patient reports no confusion or shortness of breath. Symptoms occur occasionally. The severity of symptoms is moderate. The quality of sleep is good.        Review of Systems  Constitutional: Positive for malaise/fatigue.  Eyes: Negative for blurred vision.  Respiratory: Negative for shortness of breath.   Gastrointestinal: Positive for constipation.  Psychiatric/Behavioral: Negative for confusion. The patient is nervous/anxious and has insomnia.   All other systems reviewed and are negative.      Objective:   Physical Exam  Constitutional: She is oriented to person, place, and time. She appears well-developed and well-nourished. No distress.  HENT:  Head: Normocephalic and atraumatic.  Right Ear: External ear normal.  Left Ear: External ear normal.  Nose: Nose normal.  Mouth/Throat: Oropharynx is clear and moist.  Eyes: Pupils are equal, round, and reactive to light.  Neck: Normal range of motion. Neck supple. No thyromegaly present.  Cardiovascular: Normal rate, regular rhythm, normal heart sounds and intact distal pulses.   No murmur heard. Pulmonary/Chest: Effort normal and breath sounds normal. No respiratory distress. She has no wheezes.  Abdominal: Soft. Bowel sounds are normal. She exhibits no  distension. There is no tenderness.  Musculoskeletal: Normal range of motion. She exhibits no edema or tenderness.  Neurological: She is alert and oriented to person, place, and time.  Skin: Skin is warm and dry.  Psychiatric: She has a normal mood and affect. Her behavior is normal. Judgment and thought content normal.  Vitals reviewed.     BP 136/77   Pulse 72   Temp (!) 97.5 F (36.4 C) (Oral)   Ht 5' 7" (1.702 m)   Wt 208 lb (94.3 kg)   BMI 32.58 kg/m      Assessment & Plan:  1. Hypertension associated with diabetes (HCC) - CMP14+EGFR  2. Hyperlipidemia associated with type 2 diabetes mellitus (HCC) - CMP14+EGFR - Lipid panel  3. Type 2 diabetes mellitus without complication, without long-term current use of insulin (HCC) - CMP14+EGFR - Microalbumin / creatinine urine ratio - Ambulatory referral to Ophthalmology  4. Obesity (BMI 30-39.9)  - CMP14+EGFR  5. Constipation, unspecified constipation type - CMP14+EGFR  6. Insomnia, unspecified type  - CMP14+EGFR  7. GAD (generalized anxiety disorder) -Zoloft increased to 200 mg from 150 mg Stress management discussed - CMP14+EGFR - sertraline (ZOLOFT) 100 MG tablet; Take 2 tablets (200 mg total) by mouth daily.  Dispense: 180 tablet; Refill: 1  8. Vitamin D deficiency  - CMP14+EGFR  9. Colon cancer screening  - CMP14+EGFR - Fecal occult blood, imunochemical; Future   Continue all meds Labs pending Health Maintenance reviewed Diet and exercise encouraged RTO 6 months   Christy Hawks, FNP  

## 2017-07-11 NOTE — Patient Instructions (Signed)

## 2017-07-12 LAB — MICROALBUMIN / CREATININE URINE RATIO
Creatinine, Urine: 171.2 mg/dL
Microalb/Creat Ratio: 7.1 mg/g creat (ref 0.0–30.0)
Microalbumin, Urine: 12.2 ug/mL

## 2017-07-12 LAB — CMP14+EGFR
ALBUMIN: 4.2 g/dL (ref 3.5–4.8)
ALK PHOS: 74 IU/L (ref 39–117)
ALT: 13 IU/L (ref 0–32)
AST: 16 IU/L (ref 0–40)
Albumin/Globulin Ratio: 1.4 (ref 1.2–2.2)
BILIRUBIN TOTAL: 0.3 mg/dL (ref 0.0–1.2)
BUN / CREAT RATIO: 23 (ref 12–28)
BUN: 20 mg/dL (ref 8–27)
CHLORIDE: 103 mmol/L (ref 96–106)
CO2: 21 mmol/L (ref 20–29)
Calcium: 9.9 mg/dL (ref 8.7–10.3)
Creatinine, Ser: 0.88 mg/dL (ref 0.57–1.00)
GFR calc Af Amer: 74 mL/min/{1.73_m2} (ref >59)
GFR calc non Af Amer: 64 mL/min/{1.73_m2} (ref >59)
GLUCOSE: 100 mg/dL — AB (ref 65–99)
Globulin, Total: 3 g/dL (ref 1.5–4.5)
Potassium: 4.1 mmol/L (ref 3.5–5.2)
SODIUM: 142 mmol/L (ref 134–144)
Total Protein: 7.2 g/dL (ref 6.0–8.5)

## 2017-07-12 LAB — LIPID PANEL
CHOLESTEROL TOTAL: 155 mg/dL (ref 100–199)
Chol/HDL Ratio: 3 ratio (ref 0.0–4.4)
HDL: 52 mg/dL (ref >39)
LDL Calculated: 75 mg/dL (ref 0–99)
TRIGLYCERIDES: 138 mg/dL (ref 0–149)
VLDL CHOLESTEROL CAL: 28 mg/dL (ref 5–40)

## 2017-07-22 ENCOUNTER — Other Ambulatory Visit: Payer: Self-pay | Admitting: Family

## 2017-07-28 ENCOUNTER — Other Ambulatory Visit: Payer: Self-pay | Admitting: Family

## 2017-08-20 ENCOUNTER — Other Ambulatory Visit: Payer: Self-pay | Admitting: Family

## 2017-08-20 NOTE — Telephone Encounter (Signed)
last seen 07/11/17  Jeanette Daniels  If approved route to nurse to call into [facility]  [phone]

## 2017-08-21 NOTE — Telephone Encounter (Signed)
Called in.

## 2017-09-02 ENCOUNTER — Other Ambulatory Visit: Payer: Self-pay | Admitting: Family

## 2017-09-02 MED ORDER — AMOXICILLIN-POT CLAVULANATE 500-125 MG PO TABS: 1.0000 | ORAL_TABLET | Freq: Three times a day (TID) | ORAL | 0 refills | Status: DC

## 2017-09-02 NOTE — Progress Notes (Signed)
Pt complaining of lower left abd pain at her husbands visit.  PT has hx diverticulitis and states this pain is very similar. Tender in left lower abd over the last 4-5 days that is worsening.

## 2017-09-04 ENCOUNTER — Telehealth: Payer: Self-pay | Admitting: Family

## 2017-09-04 NOTE — Telephone Encounter (Signed)
Tell her to take them with food and she can buy over-the-counter ranitidine and take it 20-30 minutes before she does the antibiotic.  There is a chance that any antibiotic could reprep her stomach if it is sensitive.  If she does these things it will likely stop this issue.  She also can take [place] yogurt or probiotic which can help reduce this issue as well.

## 2017-09-04 NOTE — Telephone Encounter (Signed)
Aware of provider's advice. 

## 2017-09-08 ENCOUNTER — Other Ambulatory Visit: Payer: Self-pay | Admitting: Family

## 2017-10-04 ENCOUNTER — Other Ambulatory Visit: Payer: Self-pay | Admitting: Family

## 2017-10-13 ENCOUNTER — Other Ambulatory Visit: Payer: Self-pay | Admitting: Family

## 2017-10-13 DIAGNOSIS — F411 Generalized anxiety disorder: Secondary | ICD-10-CM

## 2017-10-25 ENCOUNTER — Other Ambulatory Visit: Payer: Self-pay | Admitting: Family

## 2017-10-27 ENCOUNTER — Other Ambulatory Visit: Payer: Self-pay | Admitting: Family

## 2017-11-11 ENCOUNTER — Encounter: Payer: Self-pay | Admitting: Family

## 2017-11-11 ENCOUNTER — Ambulatory Visit (INDEPENDENT_AMBULATORY_CARE_PROVIDER_SITE_OTHER): Payer: BC Managed Care – PPO | Admitting: Family

## 2017-11-11 VITALS — BP 122/73 | HR 69 | Temp 97.4°F | Ht 67.0 in | Wt 213.0 lb

## 2017-11-11 DIAGNOSIS — F411 Generalized anxiety disorder: Secondary | ICD-10-CM

## 2017-11-11 DIAGNOSIS — E1169 Type 2 diabetes mellitus with other specified complication: Secondary | ICD-10-CM | POA: Diagnosis not present

## 2017-11-11 DIAGNOSIS — I10 Essential (primary) hypertension: Secondary | ICD-10-CM | POA: Diagnosis not present

## 2017-11-11 DIAGNOSIS — E669 Obesity, unspecified: Secondary | ICD-10-CM

## 2017-11-11 DIAGNOSIS — I152 Hypertension secondary to endocrine disorders: Secondary | ICD-10-CM

## 2017-11-11 DIAGNOSIS — E785 Hyperlipidemia, unspecified: Secondary | ICD-10-CM

## 2017-11-11 DIAGNOSIS — E119 Type 2 diabetes mellitus without complications: Secondary | ICD-10-CM | POA: Diagnosis not present

## 2017-11-11 DIAGNOSIS — E1159 Type 2 diabetes mellitus with other circulatory complications: Secondary | ICD-10-CM | POA: Diagnosis not present

## 2017-11-11 DIAGNOSIS — E559 Vitamin D deficiency, unspecified: Secondary | ICD-10-CM

## 2017-11-11 DIAGNOSIS — K5900 Constipation, unspecified: Secondary | ICD-10-CM | POA: Diagnosis not present

## 2017-11-11 NOTE — Progress Notes (Signed)
Subjective:    Patient ID: Jeanette Daniels, female    DOB: December 29, 1940, 77 y.o.   MRN: [id number]  PT presents to the office today for chronic follow up.  Diabetes  She presents for her follow-up diabetic visit. She has type 2 diabetes mellitus. Her disease course has been stable. Hypoglycemia symptoms include nervousness/anxiousness. There are no diabetic associated symptoms. Pertinent negatives for diabetes include no blurred vision, no foot paresthesias and no visual change. There are no hypoglycemic complications. Symptoms are stable. There are no diabetic complications. Pertinent negatives for diabetic complications include no CVA, heart disease, nephropathy or peripheral neuropathy. Risk factors for coronary artery disease include diabetes mellitus, dyslipidemia, post-menopausal and sedentary lifestyle. She is following a generally healthy diet. Her overall blood glucose range is 90-110 mg/dl. Eye exam is current.  Hypertension  This is a chronic problem. The current episode started more than 1 year ago. The problem has been resolved since onset. The problem is controlled. Associated symptoms include anxiety. Pertinent negatives include no blurred vision, malaise/fatigue, peripheral edema or shortness of breath. Risk factors for coronary artery disease include diabetes mellitus, dyslipidemia, sedentary lifestyle and smoking/tobacco exposure. The current treatment provides moderate improvement. There is no history of CVA.  Hyperlipidemia  This is a chronic problem. The current episode started more than 1 year ago. The problem is controlled. Recent lipid tests were reviewed and are normal. Pertinent negatives include no shortness of breath. Current antihyperlipidemic treatment includes statins. The current treatment provides moderate improvement of lipids. Risk factors for coronary artery disease include diabetes mellitus, dyslipidemia and post-menopausal.  Anxiety  Presents for follow-up visit.  Symptoms include excessive worry, insomnia, irritability and nervous/anxious behavior. Patient reports no depressed mood or shortness of breath. Symptoms occur occasionally. The quality of sleep is good.    Insomnia  Primary symptoms: difficulty falling asleep, frequent awakening, no malaise/fatigue.  The current episode started more than one year. The onset quality is gradual. The problem occurs intermittently. The problem has been waxing and waning since onset. The treatment provided moderate relief.      Review of Systems  Constitutional: Positive for irritability. Negative for malaise/fatigue.  Eyes: Negative for blurred vision.  Respiratory: Negative for shortness of breath.   Psychiatric/Behavioral: The patient is nervous/anxious and has insomnia.   All other systems reviewed and are negative.      Objective:   Physical Exam  Constitutional: She is oriented to person, place, and time. She appears well-developed and well-nourished. No distress.  HENT:  Head: Normocephalic and atraumatic.  Right Ear: External ear normal.  Left Ear: External ear normal.  Nose: Nose normal.  Mouth/Throat: Oropharynx is clear and moist.  Eyes: Pupils are equal, round, and reactive to light.  Neck: Normal range of motion. Neck supple. No thyromegaly present.  Cardiovascular: Normal rate, regular rhythm, normal heart sounds and intact distal pulses.  No murmur heard. Pulmonary/Chest: Effort normal and breath sounds normal. No respiratory distress. She has no wheezes.  Abdominal: Soft. Bowel sounds are normal. She exhibits no distension. There is no tenderness.  Musculoskeletal: Normal range of motion. She exhibits edema (trace in BLE). She exhibits no tenderness.  Neurological: She is alert and oriented to person, place, and time.  Skin: Skin is warm and dry.  Psychiatric: She has a normal mood and affect. Her behavior is normal. Judgment and thought content normal.  Vitals reviewed.     BP  122/73   Pulse 69   Temp (!) 97.4 F (36.3  C) (Oral)   Ht _0  (1.702 m)   Wt 213 lb (96.6 kg)   BMI 33.36 kg/m      Assessment & Plan:  1. Type 2 diabetes mellitus without complication, without long-term current use of insulin (HCC) - Bayer DCA Hb A1c Waived - CMP14+EGFR  2. Hyperlipidemia associated with type 2 diabetes mellitus ([place]) - CMP14+EGFR  3. Hypertension associated with diabetes ([place]) - CMP14+EGFR  4. GAD (generalized anxiety disorder) - CMP14+EGFR  5. Constipation, unspecified constipation type - CMP14+EGFR  6. Vitamin D deficiency  - CMP14+EGFR  7. Obesity (BMI 30-39.9)  - CMP14+EGFR   Continue all meds Labs pending Health Maintenance reviewed Diet and exercise encouraged RTO 4 months   Evelina Dun, FNP

## 2017-11-11 NOTE — Patient Instructions (Signed)

## 2017-11-20 ENCOUNTER — Other Ambulatory Visit: Payer: Self-pay | Admitting: Family

## 2017-11-20 ENCOUNTER — Ambulatory Visit (INDEPENDENT_AMBULATORY_CARE_PROVIDER_SITE_OTHER): Payer: Worker's Compensation

## 2017-11-20 ENCOUNTER — Encounter: Payer: Self-pay | Admitting: Family

## 2017-11-20 ENCOUNTER — Ambulatory Visit (INDEPENDENT_AMBULATORY_CARE_PROVIDER_SITE_OTHER): Payer: Worker's Compensation | Admitting: Family

## 2017-11-20 VITALS — BP 137/85 | HR 65 | Temp 97.8°F | Ht 67.0 in | Wt 211.0 lb

## 2017-11-20 DIAGNOSIS — S8002XA Contusion of left knee, initial encounter: Secondary | ICD-10-CM

## 2017-11-20 DIAGNOSIS — W19XXXA Unspecified fall, initial encounter: Secondary | ICD-10-CM

## 2017-11-20 DIAGNOSIS — M25562 Pain in left knee: Secondary | ICD-10-CM | POA: Diagnosis not present

## 2017-11-20 MED ORDER — MELOXICAM 15 MG PO TABS: 15.0000 mg | ORAL_TABLET | Freq: Every day | ORAL | 0 refills | Status: DC

## 2017-11-20 NOTE — Progress Notes (Signed)
   Subjective:    Patient ID: Jeanette Daniels, female    DOB: 05/24/1941, 77 y.o.   MRN: [id number]  PT presents to the office today for workers comp injury, for [employer], that occurred on 11/14/17. Pt states she was standing at a file cabinet and turned around and her right foot got caught on a hand truck and she fell on her left side.  Knee Pain   The incident occurred 3 to 5 days ago. The injury mechanism was a fall. The pain is present in the left knee. The quality of the pain is described as aching. The pain is at a severity of 7/10. The pain is moderate. The pain has been intermittent since onset. She reports no foreign bodies present. The symptoms are aggravated by weight bearing. She has tried ice, elevation and rest for the symptoms. The treatment provided mild relief.  Fall  The accident occurred 5 to 7 days ago. The fall occurred while standing. She landed on hard floor. There was no blood loss. The point of impact was the left knee and left hip. The pain is present in the left knee. The pain is at a severity of 7/10. The pain is moderate. She has tried rest and elevation for the symptoms. The treatment provided mild relief.      Review of Systems  Musculoskeletal: Positive for joint swelling.       Left knee pain  All other systems reviewed and are negative.      Objective:   Physical Exam  Constitutional: She is oriented to person, place, and time. She appears well-developed and well-nourished. No distress.  HENT:  Head: Normocephalic.  Eyes: Pupils are equal, round, and reactive to light.  Neck: Normal range of motion. Neck supple. No thyromegaly present.  Cardiovascular: Normal rate, regular rhythm, normal heart sounds and intact distal pulses.  No murmur heard. Pulmonary/Chest: Effort normal and breath sounds normal. No respiratory distress. She has no wheezes.  Abdominal: Soft. Bowel sounds are normal. She exhibits no distension. There is no tenderness.    Musculoskeletal: She exhibits edema (2+ right knee) and tenderness.  Neurological: She is alert and oriented to person, place, and time.  Skin: Skin is warm and dry.  Psychiatric: She has a normal mood and affect. Her behavior is normal. Judgment and thought content normal.  Vitals reviewed.  X-ray- Fluid, hardware intact,  Preliminary reading by Evelina Dun, FNP [facility]   BP 137/85   Pulse 65   Temp 97.8 F (36.6 C) (Oral)   Ht 5\' 7"  (1.702 m)   Wt 211 lb (95.7 kg)   BMI 33.05 kg/m      Assessment & Plan:  1. Acute pain of left knee - DG Knee 1-2 Views Left; Future - meloxicam (MOBIC) 15 MG tablet; Take 1 tablet (15 mg total) by mouth daily.  Dispense: 30 tablet; Refill: 0  2. Contusion of left knee, initial encounter - DG Knee 1-2 Views Left; Future - meloxicam (MOBIC) 15 MG tablet; Take 1 tablet (15 mg total) by mouth daily.  Dispense: 30 tablet; Refill: 0  3. Fall from standing, initial encounter - DG Knee 1-2 Views Left; Future - meloxicam (MOBIC) 15 MG tablet; Take 1 tablet (15 mg total) by mouth daily.  Dispense: 30 tablet; Refill: 0  Rest Ice Keep elevated Will write out of work til 11/24/17   Evelina Dun, FNP

## 2017-11-20 NOTE — Patient Instructions (Signed)
Contusion A contusion is a deep bruise. Contusions are the result of a blunt injury to tissues and muscle fibers under the skin. The injury causes bleeding under the skin. The skin overlying the contusion may turn blue, purple, or yellow. Minor injuries will give you a painless contusion, but more severe contusions may stay painful and swollen for a few weeks. What are the causes? This condition is usually caused by a blow, trauma, or direct force to an area of the body. What are the signs or symptoms? Symptoms of this condition include:  Swelling of the injured area.  Pain and tenderness in the injured area.  Discoloration. The area may have redness and then turn blue, purple, or yellow.  How is this diagnosed? This condition is diagnosed based on a physical exam and medical history. An X-ray, CT scan, or MRI may be needed to determine if there are any associated injuries, such as broken bones (fractures). How is this treated? Specific treatment for this condition depends on what area of the body was injured. In general, the best treatment for a contusion is resting, icing, applying pressure to (compression), and elevating the injured area. This is often called the RICE strategy. Over-the-counter anti-inflammatory medicines may also be recommended for pain control. Follow these instructions at home:  Rest the injured area.  If directed, apply ice to the injured area: ? Put ice in a plastic bag. ? Place a towel between your skin and the bag. ? Leave the ice on for 20 minutes, 2-3 times per day.  If directed, apply light compression to the injured area using an elastic bandage. Make sure the bandage is not wrapped too tightly. Remove and reapply the bandage as directed by your health care provider.  If possible, raise (elevate) the injured area above the level of your heart while you are sitting or lying down.  Take over-the-counter and prescription medicines only as told by your health  care provider. Contact a health care provider if:  Your symptoms do not improve after several days of treatment.  Your symptoms get worse.  You have difficulty moving the injured area. Get help right away if:  You have severe pain.  You have numbness in a hand or foot.  Your hand or foot turns pale or cold. This information is not intended to replace advice given to you by your health care provider. Make sure you discuss any questions you have with your health care provider. Document Released: 06/12/2005 Document Revised: 01/11/2016 Document Reviewed: 01/18/2015 Elsevier Interactive Patient Education  2018 [employer] Inc[employer]  

## 2017-11-21 ENCOUNTER — Other Ambulatory Visit: Payer: Self-pay | Admitting: Family

## 2017-11-21 DIAGNOSIS — W19XXXA Unspecified fall, initial encounter: Secondary | ICD-10-CM

## 2017-11-21 DIAGNOSIS — S8002XA Contusion of left knee, initial encounter: Secondary | ICD-10-CM

## 2017-11-21 DIAGNOSIS — M25562 Pain in left knee: Secondary | ICD-10-CM

## 2017-11-24 ENCOUNTER — Other Ambulatory Visit: Payer: Self-pay | Admitting: Family

## 2017-12-15 ENCOUNTER — Other Ambulatory Visit: Payer: Self-pay | Admitting: Family

## 2017-12-15 DIAGNOSIS — S8002XA Contusion of left knee, initial encounter: Secondary | ICD-10-CM

## 2017-12-15 DIAGNOSIS — M25562 Pain in left knee: Secondary | ICD-10-CM

## 2017-12-15 DIAGNOSIS — W19XXXA Unspecified fall, initial encounter: Secondary | ICD-10-CM

## 2017-12-29 ENCOUNTER — Telehealth: Payer: Self-pay | Admitting: Family

## 2017-12-29 NOTE — Telephone Encounter (Signed)
Patient states she has been on Metformin 500mg  BID for years. Her fasting BS normally runs 110 or lower.  States she has been laying off sugar and drinking more water.  The past week she states her fasting BS has been creeping up and patient is concerned.    4/14-140 4/15-156  Please advise

## 2017-12-29 NOTE — Telephone Encounter (Signed)
Patient aware and verbalizes understanding. 

## 2017-12-29 NOTE — Telephone Encounter (Signed)
Continue to monitor, strict low carb diet. On next visit if continues to be elevated may need to increase metformin to 1000 mg BID. However, A1C has been perfect!! Stress or infections could cause slight increase in glucose.

## 2018-01-02 ENCOUNTER — Other Ambulatory Visit: Payer: Self-pay | Admitting: Family

## 2018-02-07 ENCOUNTER — Other Ambulatory Visit: Payer: Self-pay | Admitting: Family

## 2018-02-18 ENCOUNTER — Other Ambulatory Visit: Payer: Self-pay | Admitting: Family

## 2018-02-21 ENCOUNTER — Other Ambulatory Visit: Payer: Self-pay | Admitting: Family

## 2018-02-24 ENCOUNTER — Other Ambulatory Visit: Payer: Self-pay | Admitting: Family

## 2018-02-24 NOTE — Telephone Encounter (Signed)
Next OV 05/11/18

## 2018-04-01 DIAGNOSIS — Z170 Estrogen receptor positive status [ER+]: Secondary | ICD-10-CM | POA: Diagnosis not present

## 2018-04-01 DIAGNOSIS — Z9012 Acquired absence of left breast and nipple: Secondary | ICD-10-CM | POA: Diagnosis not present

## 2018-04-01 DIAGNOSIS — C50912 Malignant neoplasm of unspecified site of left female breast: Secondary | ICD-10-CM | POA: Diagnosis not present

## 2018-04-01 DIAGNOSIS — Z853 Personal history of malignant neoplasm of breast: Secondary | ICD-10-CM | POA: Diagnosis not present

## 2018-04-01 DIAGNOSIS — M8580 Other specified disorders of bone density and structure, unspecified site: Secondary | ICD-10-CM | POA: Diagnosis not present

## 2018-04-01 DIAGNOSIS — Z923 Personal history of irradiation: Secondary | ICD-10-CM | POA: Diagnosis not present

## 2018-04-01 DIAGNOSIS — Z08 Encounter for follow-up examination after completed treatment for malignant neoplasm: Secondary | ICD-10-CM | POA: Diagnosis not present

## 2018-04-06 ENCOUNTER — Other Ambulatory Visit: Payer: Self-pay | Admitting: Family

## 2018-04-06 DIAGNOSIS — F411 Generalized anxiety disorder: Secondary | ICD-10-CM

## 2018-04-07 ENCOUNTER — Other Ambulatory Visit: Payer: Self-pay | Admitting: Family

## 2018-04-07 DIAGNOSIS — F411 Generalized anxiety disorder: Secondary | ICD-10-CM

## 2018-04-07 NOTE — Telephone Encounter (Signed)
Last seen 07/13/18

## 2018-04-08 NOTE — Telephone Encounter (Signed)
Last seen 11/20/17  Jeanette Daniels

## 2018-05-11 ENCOUNTER — Ambulatory Visit (INDEPENDENT_AMBULATORY_CARE_PROVIDER_SITE_OTHER): Payer: BC Managed Care – PPO | Admitting: Family

## 2018-05-11 ENCOUNTER — Ambulatory Visit (INDEPENDENT_AMBULATORY_CARE_PROVIDER_SITE_OTHER): Payer: BC Managed Care – PPO

## 2018-05-11 ENCOUNTER — Encounter: Payer: Self-pay | Admitting: Family

## 2018-05-11 VITALS — BP 114/65 | HR 62 | Temp 97.5°F | Ht 67.0 in | Wt 209.6 lb

## 2018-05-11 DIAGNOSIS — E1169 Type 2 diabetes mellitus with other specified complication: Secondary | ICD-10-CM | POA: Diagnosis not present

## 2018-05-11 DIAGNOSIS — E118 Type 2 diabetes mellitus with unspecified complications: Secondary | ICD-10-CM

## 2018-05-11 DIAGNOSIS — E669 Obesity, unspecified: Secondary | ICD-10-CM | POA: Diagnosis not present

## 2018-05-11 DIAGNOSIS — E1159 Type 2 diabetes mellitus with other circulatory complications: Secondary | ICD-10-CM

## 2018-05-11 DIAGNOSIS — I10 Essential (primary) hypertension: Secondary | ICD-10-CM | POA: Diagnosis not present

## 2018-05-11 DIAGNOSIS — G4700 Insomnia, unspecified: Secondary | ICD-10-CM

## 2018-05-11 DIAGNOSIS — E559 Vitamin D deficiency, unspecified: Secondary | ICD-10-CM | POA: Diagnosis not present

## 2018-05-11 DIAGNOSIS — Z780 Asymptomatic menopausal state: Secondary | ICD-10-CM

## 2018-05-11 DIAGNOSIS — K5900 Constipation, unspecified: Secondary | ICD-10-CM | POA: Diagnosis not present

## 2018-05-11 DIAGNOSIS — E785 Hyperlipidemia, unspecified: Secondary | ICD-10-CM | POA: Diagnosis not present

## 2018-05-11 DIAGNOSIS — I152 Hypertension secondary to endocrine disorders: Secondary | ICD-10-CM

## 2018-05-11 DIAGNOSIS — F411 Generalized anxiety disorder: Secondary | ICD-10-CM | POA: Diagnosis not present

## 2018-05-11 LAB — BAYER DCA HB A1C WAIVED: HB A1C: 5.8 % (ref <7.0)

## 2018-05-11 MED ORDER — ZOLPIDEM TARTRATE 10 MG PO TABS: 10.0000 mg | ORAL_TABLET | Freq: Every day | ORAL | 4 refills | Status: DC

## 2018-05-11 NOTE — Progress Notes (Signed)
Subjective:    Patient ID: Jeanette Daniels, female    DOB: July 19, 1941, 77 y.o.   MRN: [id number]   Chief Complaint  Patient presents with  . Medical Management of Chronic Issues    six month recheck   Pt presents to the office today for chronic follow up.  Diabetes  She presents for her follow-up diabetic visit. She has type 2 diabetes mellitus. Her disease course has been stable. Hypoglycemia symptoms include nervousness/anxiousness. Pertinent negatives for hypoglycemia include no headaches. There are no diabetic associated symptoms. Pertinent negatives for diabetes include no foot paresthesias and no foot ulcerations. There are no hypoglycemic complications. Symptoms are stable. Pertinent negatives for diabetic complications include no CVA. Risk factors for coronary artery disease include diabetes mellitus, dyslipidemia, hypertension, sedentary lifestyle and post-menopausal. She is following a generally healthy diet. Her overall blood glucose range is 110-130 mg/dl. Eye exam is current.  Hypertension  This is a chronic problem. The current episode started more than 1 year ago. The problem has been waxing and waning since onset. The problem is controlled. Associated symptoms include anxiety and peripheral edema ("at little at times"). Pertinent negatives include no headaches or shortness of breath. Risk factors for coronary artery disease include dyslipidemia, diabetes mellitus, obesity and sedentary lifestyle. There is no history of CAD/MI, CVA or heart failure.  Hyperlipidemia  This is a chronic problem. The current episode started more than 1 year ago. The problem is controlled. Recent lipid tests were reviewed and are normal. Exacerbating diseases include obesity. Pertinent negatives include no shortness of breath. Current antihyperlipidemic treatment includes statins. The current treatment provides moderate improvement of lipids. Risk factors for coronary artery disease include dyslipidemia,  diabetes mellitus, hypertension, female sex and a sedentary lifestyle.  Anxiety  Presents for follow-up visit. Symptoms include excessive worry, insomnia, nervous/anxious behavior and restlessness. Patient reports no shortness of breath. Symptoms occur most days. The severity of symptoms is mild.    Constipation  This is a chronic problem. The current episode started more than 1 year ago. The problem has been resolved since onset. Her stool frequency is 1 time per day. The treatment provided moderate relief.  Insomnia  Primary symptoms: difficulty falling asleep, frequent awakening.  The current episode started more than one year. The onset quality is gradual. The problem occurs intermittently. The problem has been waxing and waning since onset. Past treatments include medication. The treatment provided moderate relief.      Review of Systems  Respiratory: Negative for shortness of breath.   Gastrointestinal: Positive for constipation.  Neurological: Negative for headaches.  Psychiatric/Behavioral: The patient is nervous/anxious and has insomnia.   All other systems reviewed and are negative.      Objective:   Physical Exam  Constitutional: She is oriented to person, place, and time. She appears well-developed and well-nourished. No distress.  HENT:  Head: Normocephalic and atraumatic.  Right Ear: External ear normal.  Left Ear: External ear normal.  Mouth/Throat: Oropharynx is clear and moist.  Eyes: Pupils are equal, round, and reactive to light.  Neck: Normal range of motion. Neck supple. No thyromegaly present.  Cardiovascular: Normal rate, regular rhythm, normal heart sounds and intact distal pulses.  No murmur heard. Pulmonary/Chest: Effort normal and breath sounds normal. No respiratory distress. She has no wheezes.  Abdominal: Soft. Bowel sounds are normal. She exhibits no distension. There is no tenderness.  Musculoskeletal: Normal range of motion. She exhibits edema  (trace BLE). She exhibits no tenderness.  Neurological:  She is alert and oriented to person, place, and time. She has normal reflexes. No cranial nerve deficit.  Skin: Skin is warm and dry.  Psychiatric: She has a normal mood and affect. Her behavior is normal. Judgment and thought content normal.  Vitals reviewed.     BP 114/65   Pulse 62   Temp (!) 97.5 F (36.4 C) (Oral)   Ht 5' 7" (1.702 m)   Wt 209 lb 9.6 oz (95.1 kg)   BMI 32.83 kg/m      Assessment & Plan:  MALIE KASHANI comes in today with chief complaint of Medical Management of Chronic Issues (six month recheck)   Diagnosis and orders addressed:  1. Type 2 diabetes mellitus with complication, without long-term current use of insulin (HCC) - Bayer DCA Hb A1c Waived - CMP14+EGFR - CBC with Differential/Platelet  2. Hypertension associated with diabetes ([place]) - CMP14+EGFR - CBC with Differential/Platelet  3. Hyperlipidemia associated with type 2 diabetes mellitus ([place]) - CMP14+EGFR - CBC with Differential/Platelet - Lipid panel  4. GAD (generalized anxiety disorder) - CMP14+EGFR - CBC with Differential/Platelet  5. Constipation, unspecified constipation type - CMP14+EGFR - CBC with Differential/Platelet  6. Insomnia, unspecified type  - CMP14+EGFR - CBC with Differential/Platelet  7. Obesity (BMI 30-39.9) - CMP14+EGFR - CBC with Differential/Platelet  8. Vitamin D deficiency  - CMP14+EGFR - CBC with Differential/Platelet - VITAMIN D 25 Hydroxy (Vit-D Deficiency, Fractures)  9. Post-menopause  - CMP14+EGFR - CBC with Differential/Platelet - DG [facility] DEXA   Labs pending Health Maintenance reviewed Diet and exercise encouraged  Follow up plan: 6 months   Evelina Dun, FNP

## 2018-05-11 NOTE — Patient Instructions (Signed)

## 2018-05-12 ENCOUNTER — Other Ambulatory Visit: Payer: Self-pay | Admitting: Family

## 2018-05-12 LAB — CBC WITH DIFFERENTIAL/PLATELET
BASOS ABS: 0 10*3/uL (ref 0.0–0.2)
Basos: 0 %
EOS (ABSOLUTE): 0.1 10*3/uL (ref 0.0–0.4)
EOS: 2 %
HEMATOCRIT: 39.6 % (ref 34.0–46.6)
HEMOGLOBIN: 13.4 g/dL (ref 11.1–15.9)
IMMATURE GRANS (ABS): 0 10*3/uL (ref 0.0–0.1)
Immature Granulocytes: 0 %
LYMPHS ABS: 2.1 10*3/uL (ref 0.7–3.1)
LYMPHS: 27 %
MCH: 30.6 pg (ref 26.6–33.0)
MCHC: 33.8 g/dL (ref 31.5–35.7)
MCV: 90 fL (ref 79–97)
MONOCYTES: 7 %
Monocytes Absolute: 0.5 10*3/uL (ref 0.1–0.9)
Neutrophils Absolute: 4.9 10*3/uL (ref 1.4–7.0)
Neutrophils: 64 %
Platelets: 236 10*3/uL (ref 150–450)
RBC: 4.38 x10E6/uL (ref 3.77–5.28)
RDW: 13.9 % (ref 12.3–15.4)
WBC: 7.6 10*3/uL (ref 3.4–10.8)

## 2018-05-12 LAB — CMP14+EGFR
ALBUMIN: 4.3 g/dL (ref 3.5–4.8)
ALT: 12 IU/L (ref 0–32)
AST: 13 IU/L (ref 0–40)
Albumin/Globulin Ratio: 1.5 (ref 1.2–2.2)
Alkaline Phosphatase: 70 IU/L (ref 39–117)
BUN / CREAT RATIO: 18 (ref 12–28)
BUN: 13 mg/dL (ref 8–27)
Bilirubin Total: 0.3 mg/dL (ref 0.0–1.2)
CO2: 24 mmol/L (ref 20–29)
CREATININE: 0.71 mg/dL (ref 0.57–1.00)
Calcium: 10 mg/dL (ref 8.7–10.3)
Chloride: 101 mmol/L (ref 96–106)
GFR calc non Af Amer: 83 mL/min/{1.73_m2} (ref >59)
GFR, EST AFRICAN AMERICAN: 96 mL/min/{1.73_m2} (ref >59)
GLUCOSE: 88 mg/dL (ref 65–99)
Globulin, Total: 2.8 g/dL (ref 1.5–4.5)
Potassium: 5.1 mmol/L (ref 3.5–5.2)
Sodium: 140 mmol/L (ref 134–144)
TOTAL PROTEIN: 7.1 g/dL (ref 6.0–8.5)

## 2018-05-12 LAB — LIPID PANEL
CHOL/HDL RATIO: 2.9 ratio (ref 0.0–4.4)
Cholesterol, Total: 168 mg/dL (ref 100–199)
HDL: 57 mg/dL (ref >39)
LDL CALC: 95 mg/dL (ref 0–99)
TRIGLYCERIDES: 82 mg/dL (ref 0–149)
VLDL CHOLESTEROL CAL: 16 mg/dL (ref 5–40)

## 2018-05-12 LAB — VITAMIN D 25 HYDROXY (VIT D DEFICIENCY, FRACTURES): Vit D, 25-Hydroxy: 30.7 ng/mL (ref 30.0–100.0)

## 2018-05-13 ENCOUNTER — Telehealth: Payer: Self-pay | Admitting: Family

## 2018-05-13 NOTE — Telephone Encounter (Signed)
Patient aware of results.

## 2018-05-23 ENCOUNTER — Other Ambulatory Visit: Payer: Self-pay | Admitting: Family

## 2018-05-25 ENCOUNTER — Other Ambulatory Visit: Payer: Self-pay | Admitting: Family

## 2018-06-02 ENCOUNTER — Other Ambulatory Visit: Payer: Self-pay | Admitting: Family

## 2018-06-09 ENCOUNTER — Other Ambulatory Visit: Payer: Self-pay | Admitting: Family

## 2018-06-10 ENCOUNTER — Ambulatory Visit (INDEPENDENT_AMBULATORY_CARE_PROVIDER_SITE_OTHER): Payer: BC Managed Care – PPO

## 2018-06-10 DIAGNOSIS — Z23 Encounter for immunization: Secondary | ICD-10-CM | POA: Diagnosis not present

## 2018-06-29 ENCOUNTER — Other Ambulatory Visit: Payer: Self-pay | Admitting: Family

## 2018-08-04 ENCOUNTER — Ambulatory Visit (INDEPENDENT_AMBULATORY_CARE_PROVIDER_SITE_OTHER): Payer: BC Managed Care – PPO | Admitting: Family

## 2018-08-04 ENCOUNTER — Encounter: Payer: Self-pay | Admitting: Family

## 2018-08-04 VITALS — BP 106/66 | HR 70 | Temp 97.5°F | Ht 67.0 in | Wt 214.6 lb

## 2018-08-04 DIAGNOSIS — J0100 Acute maxillary sinusitis, unspecified: Secondary | ICD-10-CM

## 2018-08-04 MED ORDER — DOXYCYCLINE HYCLATE 100 MG PO TABS: 100.0000 mg | ORAL_TABLET | Freq: Two times a day (BID) | ORAL | 0 refills | Status: DC

## 2018-08-04 MED ORDER — HYDROCODONE-HOMATROPINE 5-1.5 MG/5ML PO SYRP: 5.0000 mL | ORAL_SOLUTION | Freq: Three times a day (TID) | ORAL | 0 refills | Status: DC | PRN

## 2018-08-04 NOTE — Progress Notes (Signed)
Acute Office Visit  Subjective:    Patient ID: Jeanette Daniels, female    DOB: [birth year], 77 y.o.   MRN: [id number]  Chief Complaint  Patient presents with  . head and chest congestion   Patient is in today for sinusitis and cough.  Sinusitis  This is a recurrent problem. The current episode started 1 to 4 weeks ago. The problem has been waxing and waning since onset. There has been no fever. Her pain is at a severity of 5/10. The pain is moderate. Associated symptoms include congestion, coughing, headaches, a hoarse voice, sinus pressure and a sore throat. Pertinent negatives include no ear pain or sneezing. Past treatments include acetaminophen. The treatment provided mild relief.     Past Medical History:  Diagnosis Date  . Breast cancer ([place])    S/P chemotherapy, radiation, and surgery  . Hyperlipidemia    x16 years  . Hypertension    x16 years    Past Surgical History:  Procedure Laterality Date  . MASTECTOMY     Left  . TOTAL KNEE ARTHROPLASTY     Right    Family History  Problem Relation Age of Onset  . Alzheimer's disease Father     Social History   Socioeconomic History  . Marital status: Married    Spouse name: Not on file  . Number of children: Not on file  . Years of education: Not on file  . Highest education level: Not on file  Occupational History  . 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 Financial resource strain: Not on file  . Food insecurity:    Worry: Not on file    Inability: Not on file  . Transportation needs:    Medical: Not on file    Non-medical: Not on file  Tobacco Use  . Smoking status: Never Smoker  . Smokeless tobacco: Never Used  Substance and Sexual Activity  . Alcohol use: No  . Drug use: No  . Sexual activity: Not on file  Lifestyle  . Physical activity:    Days per week: Not on file    Minutes per session: Not on file  . Stress: Not on file  Relationships  .  Social connections:    Talks on phone: Not on file    Gets together: Not on file    Attends religious service: Not on file    Active member of club or organization: Not on file    Attends meetings of clubs or organizations: Not on file    Relationship status: Not on file  . Intimate partner violence:    Fear of current or ex partner: Not on file    Emotionally abused: Not on file    Physically abused: Not on file    Forced sexual activity: Not on file  Other Topics Concern  . Not on file  Social History Narrative   Married with 2 children    Outpatient Medications Prior to Visit  Medication Sig Dispense Refill  . aspirin EC 81 MG tablet Take 1 tablet (81 mg total) by mouth daily. 90 tablet 2  . atorvastatin (LIPITOR) 20 MG tablet Take 1 tablet (20 mg total) by mouth daily. 90 tablet 0  . Cholecalciferol (VITAMIN D) 2000 UNITS CAPS Take 1,000 Units by mouth 2 (two) times daily. gummie chewable    . docusate sodium (COLACE) 100 MG capsule Take 100 mg by mouth daily.      Marland Kitchen  losartan (COZAAR) 100 MG tablet TAKE 1 TABLET BY MOUTH ONCE DAILY FOR BLOOD PRESSURE 90 tablet 1  . metFORMIN (GLUCOPHAGE) 500 MG tablet TAKE 1 TABLET BY MOUTH TWICE DAILY WITH MEALS 60 tablet 2  . metoprolol succinate (TOPROL-XL) 100 MG 24 hr tablet TAKE 1 TABLET BY MOUTH TWICE DAILY 60 tablet 4  . ONE TOUCH ULTRA TEST test strip USE TO CHECK BLOOD SUGAR UP TO 4 TIMES DAILY 200 each 5  . sertraline (ZOLOFT) 100 MG tablet Take 2 tablets (200 mg total) by mouth daily. 180 tablet 0  . zolpidem (AMBIEN) 10 MG tablet Take 1 tablet (10 mg total) by mouth at bedtime. 30 tablet 4   No facility-administered medications prior to visit.     Allergies  Allergen Reactions  . Ace Inhibitors Cough  . Augmentin [Amoxicillin-Pot Clavulanate] Diarrhea    Nausea  . Celecoxib     Review of Systems  HENT: Positive for congestion, hoarse voice, sinus pressure and sore throat. Negative for ear pain and sneezing.   Respiratory:  Positive for cough.   Neurological: Positive for headaches.  All other systems reviewed and are negative.      Objective:    Physical Exam  Constitutional: She is oriented to person, place, and time. She appears well-developed and well-nourished. No distress.  HENT:  Head: Normocephalic and atraumatic.  Right Ear: External ear normal.  Left Ear: External ear normal.  Nose: Mucosal edema and rhinorrhea present. Right sinus exhibits maxillary sinus tenderness. Left sinus exhibits maxillary sinus tenderness.  Mouth/Throat: Posterior oropharyngeal erythema present.  Eyes: Pupils are equal, round, and reactive to light.  Neck: Normal range of motion. Neck supple. No thyromegaly present.  Cardiovascular: Normal rate, regular rhythm, normal heart sounds and intact distal pulses.  No murmur heard. Pulmonary/Chest: Effort normal and breath sounds normal. No respiratory distress. She has no wheezes.  Abdominal: Soft. Bowel sounds are normal. She exhibits no distension. There is no tenderness.  Musculoskeletal: Normal range of motion. She exhibits no edema or tenderness.  Neurological: She is alert and oriented to person, place, and time. She has normal reflexes. No cranial nerve deficit.  Skin: Skin is warm and dry.  Psychiatric: She has a normal mood and affect. Her behavior is normal. Judgment and thought content normal.  Vitals reviewed.   BP 106/66   Pulse 70   Temp (!) 97.5 F (36.4 C) (Oral)   Ht 5\' 7"  (1.702 m)   Wt 214 lb 9.6 oz (97.3 kg)   BMI 33.61 kg/m  Wt Readings from Last 3 Encounters:  08/04/18 214 lb 9.6 oz (97.3 kg)  05/11/18 209 lb 9.6 oz (95.1 kg)  11/20/17 211 lb (95.7 kg)    Health Maintenance Due  Topic Date Due  . OPHTHALMOLOGY EXAM  04/11/2017  . FOOT EXAM  01/09/2018       Assessment & Plan:   Problem List Items Addressed This Visit    None    Visit Diagnoses    Acute non-recurrent maxillary sinusitis    -  Primary   Relevant Medications    doxycycline (VIBRA-TABS) 100 MG tablet   HYDROcodone-homatropine (HYCODAN) 5-1.5 MG/5ML syrup       Meds ordered this encounter  Medications  . doxycycline (VIBRA-TABS) 100 MG tablet    Sig: Take 1 tablet (100 mg total) by mouth 2 (two) times daily.    Dispense:  20 tablet    Refill:  0    Order Specific Question:   Supervising  Provider    Answer:   Caryl Pina A [[id number]]  . HYDROcodone-homatropine (HYCODAN) 5-1.5 MG/5ML syrup    Sig: Take 5 mLs by mouth every 8 (eight) hours as needed for cough.    Dispense:  120 mL    Refill:  0    Order Specific Question:   Supervising Provider    Answer:   Caryl Pina A [[id number]]   - Take meds as prescribed - Use a cool mist humidifier  -Use saline nose sprays frequently -Force fluids -For any cough or congestion  Use plain Mucinex- regular strength or max strength is fine -For fever or aces or pains- take tylenol or ibuprofen. -Throat lozenges if help RTO if symptoms worsen or do not improve  Evelina Dun, FNP

## 2018-08-04 NOTE — Patient Instructions (Signed)

## 2018-08-18 DIAGNOSIS — M25561 Pain in right knee: Secondary | ICD-10-CM | POA: Diagnosis not present

## 2018-08-22 ENCOUNTER — Other Ambulatory Visit: Payer: Self-pay | Admitting: Family

## 2018-08-27 ENCOUNTER — Telehealth: Payer: Self-pay | Admitting: Family

## 2018-08-27 MED ORDER — METRONIDAZOLE 500 MG PO TABS: 500.0000 mg | ORAL_TABLET | Freq: Three times a day (TID) | ORAL | 0 refills | Status: DC

## 2018-08-27 MED ORDER — CIPROFLOXACIN HCL 500 MG PO TABS: 500.0000 mg | ORAL_TABLET | Freq: Two times a day (BID) | ORAL | 0 refills | Status: DC

## 2018-08-27 NOTE — Telephone Encounter (Signed)
Cipro and Flagyl Prescription sent to pharmacy. IF symptoms worsen or do not improve she will need to be seen.

## 2018-08-27 NOTE — Telephone Encounter (Signed)
Pt states she has diverticulitis and she has eaten some nuts and homemade soup and would like to have an antibiotic sent into the pharmacy. Advised pt she may ntbs. Please advise

## 2018-08-27 NOTE — Telephone Encounter (Signed)
Pt aware.

## 2018-09-22 ENCOUNTER — Other Ambulatory Visit: Payer: Self-pay | Admitting: Family

## 2018-10-05 ENCOUNTER — Other Ambulatory Visit: Payer: Self-pay | Admitting: Family

## 2018-10-05 DIAGNOSIS — T84012S Broken internal right knee prosthesis, sequela: Secondary | ICD-10-CM | POA: Diagnosis not present

## 2018-10-05 DIAGNOSIS — Z01818 Encounter for other preprocedural examination: Secondary | ICD-10-CM | POA: Diagnosis not present

## 2018-10-05 DIAGNOSIS — Z01812 Encounter for preprocedural laboratory examination: Secondary | ICD-10-CM | POA: Diagnosis not present

## 2018-10-05 DIAGNOSIS — F411 Generalized anxiety disorder: Secondary | ICD-10-CM

## 2018-10-05 DIAGNOSIS — E669 Obesity, unspecified: Secondary | ICD-10-CM | POA: Diagnosis not present

## 2018-10-05 DIAGNOSIS — R9431 Abnormal electrocardiogram [ECG] [EKG]: Secondary | ICD-10-CM | POA: Diagnosis not present

## 2018-10-05 DIAGNOSIS — E119 Type 2 diabetes mellitus without complications: Secondary | ICD-10-CM | POA: Diagnosis not present

## 2018-10-05 DIAGNOSIS — Z01810 Encounter for preprocedural cardiovascular examination: Secondary | ICD-10-CM | POA: Diagnosis not present

## 2018-10-05 DIAGNOSIS — E785 Hyperlipidemia, unspecified: Secondary | ICD-10-CM | POA: Diagnosis not present

## 2018-10-05 DIAGNOSIS — Z6834 Body mass index (BMI) 34.0-34.9, adult: Secondary | ICD-10-CM | POA: Diagnosis not present

## 2018-10-05 NOTE — Telephone Encounter (Signed)
OV 11/13/18

## 2018-10-19 DIAGNOSIS — T84032A Mechanical loosening of internal right knee prosthetic joint, initial encounter: Secondary | ICD-10-CM | POA: Diagnosis not present

## 2018-10-19 DIAGNOSIS — F419 Anxiety disorder, unspecified: Secondary | ICD-10-CM | POA: Diagnosis present

## 2018-10-19 DIAGNOSIS — E785 Hyperlipidemia, unspecified: Secondary | ICD-10-CM | POA: Diagnosis present

## 2018-10-19 DIAGNOSIS — Z7982 Long term (current) use of aspirin: Secondary | ICD-10-CM | POA: Diagnosis not present

## 2018-10-19 DIAGNOSIS — E669 Obesity, unspecified: Secondary | ICD-10-CM | POA: Diagnosis present

## 2018-10-19 DIAGNOSIS — Z886 Allergy status to analgesic agent status: Secondary | ICD-10-CM | POA: Diagnosis not present

## 2018-10-19 DIAGNOSIS — T8484XA Pain due to internal orthopedic prosthetic devices, implants and grafts, initial encounter: Secondary | ICD-10-CM | POA: Diagnosis present

## 2018-10-19 DIAGNOSIS — I10 Essential (primary) hypertension: Secondary | ICD-10-CM | POA: Diagnosis present

## 2018-10-19 DIAGNOSIS — Z79899 Other long term (current) drug therapy: Secondary | ICD-10-CM | POA: Diagnosis not present

## 2018-10-19 DIAGNOSIS — M1990 Unspecified osteoarthritis, unspecified site: Secondary | ICD-10-CM | POA: Diagnosis present

## 2018-10-19 DIAGNOSIS — Z6831 Body mass index (BMI) 31.0-31.9, adult: Secondary | ICD-10-CM | POA: Diagnosis not present

## 2018-10-19 DIAGNOSIS — Z7984 Long term (current) use of oral hypoglycemic drugs: Secondary | ICD-10-CM | POA: Diagnosis not present

## 2018-10-19 DIAGNOSIS — E119 Type 2 diabetes mellitus without complications: Secondary | ICD-10-CM | POA: Diagnosis present

## 2018-10-19 DIAGNOSIS — Z96651 Presence of right artificial knee joint: Secondary | ICD-10-CM | POA: Diagnosis not present

## 2018-11-02 ENCOUNTER — Other Ambulatory Visit: Payer: Self-pay | Admitting: Family

## 2018-11-13 ENCOUNTER — Ambulatory Visit: Payer: Self-pay | Admitting: Family

## 2018-11-19 ENCOUNTER — Telehealth: Payer: Self-pay | Admitting: Family

## 2018-11-19 ENCOUNTER — Other Ambulatory Visit: Payer: Self-pay | Admitting: Family

## 2018-11-19 NOTE — Telephone Encounter (Signed)
What is the name of the medication? Ambien was denied Has appt 3-19. Had to postpone the one she had because she had knee surgery  Have you contacted your pharmacy to request a refill? Yes  Which pharmacy would you like this sent to? [facility]   Patient notified that their request is being sent to the clinical staff for review and that they should receive a call once it is complete. If they do not receive a call within 24 hours they can check with their pharmacy or our office.

## 2018-11-20 ENCOUNTER — Other Ambulatory Visit: Payer: Self-pay | Admitting: Family

## 2018-11-20 MED ORDER — ZOLPIDEM TARTRATE 10 MG PO TABS: 10.0000 mg | ORAL_TABLET | Freq: Every day | ORAL | 4 refills | Status: DC

## 2018-11-20 NOTE — Telephone Encounter (Signed)
Prescription sent to pharmacy.

## 2018-11-20 NOTE — Telephone Encounter (Signed)
Patient aware.

## 2018-11-24 ENCOUNTER — Other Ambulatory Visit: Payer: Self-pay | Admitting: Family

## 2018-11-25 MED ORDER — LOSARTAN POTASSIUM 50 MG PO TABS: 100.0000 mg | ORAL_TABLET | Freq: Every day | ORAL | 1 refills | Status: DC

## 2018-11-25 NOTE — Telephone Encounter (Signed)
Note From Pharmacy  LOSARTAN ON BACKORDER.  PLS CONSIDER ALTERNATIVE

## 2018-11-25 NOTE — Telephone Encounter (Signed)
Losartan changed to 50 mg. She will take 2 tablets because the 100 mg tablet is backordered.

## 2018-11-25 NOTE — Telephone Encounter (Signed)
Left message for patient to call.

## 2018-11-25 NOTE — Telephone Encounter (Signed)
Left message ,dose of medication same but will need to take two 50 mg pills.  Call to confirm message was received.

## 2018-11-25 NOTE — Telephone Encounter (Signed)
Aware. 

## 2018-11-27 ENCOUNTER — Other Ambulatory Visit: Payer: Self-pay | Admitting: Family

## 2018-12-03 ENCOUNTER — Ambulatory Visit: Payer: Self-pay | Admitting: Family

## 2018-12-23 ENCOUNTER — Telehealth: Payer: Self-pay | Admitting: *Deleted

## 2018-12-23 ENCOUNTER — Telehealth: Payer: Self-pay

## 2018-12-23 MED ORDER — TELMISARTAN 80 MG PO TABS: 80.0000 mg | ORAL_TABLET | Freq: Every day | ORAL | 1 refills | Status: DC

## 2018-12-23 NOTE — Telephone Encounter (Signed)
Thanks

## 2018-12-23 NOTE — Telephone Encounter (Signed)
Fax from [facility] requested Losartan 100 mg & 50 mg on backorder Please consider alternative (Telmisartan) Please advise

## 2018-12-23 NOTE — Telephone Encounter (Signed)
Losartan changed to Telmisartan because of back order.

## 2018-12-26 ENCOUNTER — Other Ambulatory Visit: Payer: Self-pay | Admitting: Family

## 2019-01-07 ENCOUNTER — Other Ambulatory Visit: Payer: Self-pay | Admitting: Family

## 2019-01-07 DIAGNOSIS — F411 Generalized anxiety disorder: Secondary | ICD-10-CM

## 2019-01-08 NOTE — Telephone Encounter (Signed)
Last seen 08/04/18

## 2019-01-11 ENCOUNTER — Other Ambulatory Visit: Payer: Self-pay

## 2019-01-11 ENCOUNTER — Encounter: Payer: Self-pay | Admitting: Family

## 2019-01-11 ENCOUNTER — Ambulatory Visit (INDEPENDENT_AMBULATORY_CARE_PROVIDER_SITE_OTHER): Payer: BC Managed Care – PPO | Admitting: Family

## 2019-01-11 DIAGNOSIS — E1169 Type 2 diabetes mellitus with other specified complication: Secondary | ICD-10-CM

## 2019-01-11 DIAGNOSIS — K5900 Constipation, unspecified: Secondary | ICD-10-CM

## 2019-01-11 DIAGNOSIS — E559 Vitamin D deficiency, unspecified: Secondary | ICD-10-CM

## 2019-01-11 DIAGNOSIS — F411 Generalized anxiety disorder: Secondary | ICD-10-CM | POA: Diagnosis not present

## 2019-01-11 DIAGNOSIS — E1159 Type 2 diabetes mellitus with other circulatory complications: Secondary | ICD-10-CM

## 2019-01-11 DIAGNOSIS — G4700 Insomnia, unspecified: Secondary | ICD-10-CM

## 2019-01-11 DIAGNOSIS — I152 Hypertension secondary to endocrine disorders: Secondary | ICD-10-CM

## 2019-01-11 DIAGNOSIS — E785 Hyperlipidemia, unspecified: Secondary | ICD-10-CM

## 2019-01-11 DIAGNOSIS — E669 Obesity, unspecified: Secondary | ICD-10-CM

## 2019-01-11 DIAGNOSIS — I10 Essential (primary) hypertension: Secondary | ICD-10-CM

## 2019-01-11 NOTE — Progress Notes (Signed)
Virtual Visit via telephone Note  I connected with Jeanette Daniels on 01/11/19 at 3:01 pm,  by telephone and verified that I am speaking with the correct person using two identifiers. Jeanette Daniels is currently located at home  and no one is currently with her during visit. The provider, Evelina Dun, FNP is located in their office at time of visit.  I discussed the limitations, risks, security and privacy concerns of performing an evaluation and management service by telephone and the availability of in person appointments. I also discussed with the patient that there may be a patient responsible charge related to this service. The patient expressed understanding and agreed to proceed.   History and Present Illness:   Pt presents to the office today for chronic follow up. Diabetes  She presents for her follow-up diabetic visit. She has type 2 diabetes mellitus. Her disease course has been stable. Hypoglycemia symptoms include nervousness/anxiousness. Pertinent negatives for diabetes include no blurred vision, no foot paresthesias and no visual change. There are no hypoglycemic complications. Symptoms are stable. Pertinent negatives for diabetic complications include no CVA. Risk factors for coronary artery disease include diabetes mellitus, dyslipidemia, hypertension, sedentary lifestyle and post-menopausal. She is following a generally healthy diet. Her overall blood glucose range is 110-130 mg/dl. Eye exam is not current.  Hyperlipidemia  This is a chronic problem. The current episode started more than 1 year ago. The problem is controlled. Exacerbating diseases include obesity. Pertinent negatives include no shortness of breath. Current antihyperlipidemic treatment includes statins. Risk factors for coronary artery disease include dyslipidemia, diabetes mellitus, hypertension, a sedentary lifestyle and post-menopausal.  Hypertension  This is a chronic problem. The current episode started more  than 1 year ago. The problem has been resolved since onset. The problem is controlled (128/77). Associated symptoms include anxiety. Pertinent negatives include no blurred vision, malaise/fatigue, peripheral edema or shortness of breath. Risk factors for coronary artery disease include dyslipidemia, diabetes mellitus, obesity and sedentary lifestyle. The current treatment provides moderate improvement. There is no history of kidney disease, CAD/MI, CVA or heart failure.  Insomnia  Primary symptoms: no malaise/fatigue.  The current episode started more than one year. The onset quality is gradual. The problem occurs intermittently. The problem has been waxing and waning since onset.  Anxiety  Presents for follow-up visit. Symptoms include depressed mood, excessive worry, insomnia, irritability, nervous/anxious behavior and restlessness. Patient reports no shortness of breath. Symptoms occur occasionally. The severity of symptoms is moderate. The quality of sleep is good.    Constipation  This is a chronic problem. The current episode started more than 1 year ago. The problem has been waxing and waning since onset. She has tried stool softeners for the symptoms. The treatment provided mild relief.      Review of Systems  Constitutional: Positive for irritability. Negative for malaise/fatigue.  Eyes: Negative for blurred vision.  Respiratory: Negative for shortness of breath.   Gastrointestinal: Positive for constipation.  Psychiatric/Behavioral: The patient is nervous/anxious and has insomnia.   All other systems reviewed and are negative.   Observations/Objective: No SOB or distress noted  Assessment and Plan: Jeanette Daniels comes in today with chief complaint of No chief complaint on file.   Diagnosis and orders addressed: 1. Constipation, unspecified constipation type  2. Type 2 diabetes mellitus with other specified complication, without long-term current use of insulin ([place])  3.  GAD (generalized anxiety disorder)  4. Hyperlipidemia associated with type 2 diabetes mellitus ([place])  5. Hypertension associated with diabetes ([place])  6. Insomnia, unspecified type  7. Vitamin D deficiency  8. Obesity (BMI 30-39.9)   Health Maintenance reviewed Diet and exercise encouraged  Follow up plan: 3 months       I discussed the assessment and treatment plan with the patient. The patient was provided an opportunity to ask questions and all were answered. The patient agreed with the plan and demonstrated an understanding of the instructions.   The patient was advised to call back or seek an in-person evaluation if the symptoms worsen or if the condition fails to improve as anticipated.  The above assessment and management plan was discussed with the patient. The patient verbalized understanding of and has agreed to the management plan. Patient is aware to call the clinic if symptoms persist or worsen. Patient is aware when to return to the clinic for a follow-up visit. Patient educated on when it is appropriate to go to the emergency department.    Call ended 3:11, I provided 10 minutes of non-face-to-face time during this encounter.    Evelina Dun, FNP

## 2019-01-28 ENCOUNTER — Other Ambulatory Visit: Payer: Self-pay | Admitting: Family

## 2019-02-17 ENCOUNTER — Other Ambulatory Visit: Payer: Self-pay | Admitting: Family

## 2019-03-02 ENCOUNTER — Ambulatory Visit (INDEPENDENT_AMBULATORY_CARE_PROVIDER_SITE_OTHER): Payer: BC Managed Care – PPO | Admitting: *Deleted

## 2019-03-02 DIAGNOSIS — Z0000 Encounter for general adult medical examination without abnormal findings: Secondary | ICD-10-CM | POA: Diagnosis not present

## 2019-03-02 MED ORDER — LOSARTAN POTASSIUM 50 MG PO TABS: 100.0000 mg | ORAL_TABLET | Freq: Every day | ORAL | 1 refills | Status: DC

## 2019-03-02 NOTE — Patient Instructions (Signed)
Preventive Care 42 Years and Older, Female Preventive care refers to lifestyle choices and visits with your health care provider that can promote health and wellness. What does preventive care include?  A yearly physical exam. This is also called an annual well check.  Dental exams once or twice a year.  Routine eye exams. Ask your health care provider how often you should have your eyes checked.  Personal lifestyle choices, including: ? Daily care of your teeth and gums. ? Regular physical activity. ? Eating a healthy diet. ? Avoiding tobacco and drug use. ? Limiting alcohol use. ? Practicing safe sex. ? Taking low-dose aspirin every day. ? Taking vitamin and mineral supplements as recommended by your health care provider. What happens during an annual well check? The services and screenings done by your health care provider during your annual well check will depend on your age, overall health, lifestyle risk factors, and family history of disease. Counseling Your health care provider may ask you questions about your:  Alcohol use.  Tobacco use.  Drug use.  Emotional well-being.  Home and relationship well-being.  Sexual activity.  Eating habits.  History of falls.  Memory and ability to understand (cognition).  Work and work Statistician.  Reproductive health.  Screening You may have the following tests or measurements:  Height, weight, and BMI.  Blood pressure.  Lipid and cholesterol levels. These may be checked every 5 years, or more frequently if you are over 30 years old.  Skin check.  Lung cancer screening. You may have this screening every year starting at age 27 if you have a 30-pack-year history of smoking and currently smoke or have quit within the past 15 years.  Colorectal cancer screening. All adults should have this screening starting at age 33 and continuing until age 46. You will have tests every 1-10 years, depending on your results and the  type of screening test. People at increased risk should start screening at an earlier age. Screening tests may include: ? Guaiac-based fecal occult blood testing. ? Fecal immunochemical test (FIT). ? Stool DNA test. ? Virtual colonoscopy. ? Sigmoidoscopy. During this test, a flexible tube with a tiny camera (sigmoidoscope) is used to examine your rectum and lower colon. The sigmoidoscope is inserted through your anus into your rectum and lower colon. ? Colonoscopy. During this test, a long, thin, flexible tube with a tiny camera (colonoscope) is used to examine your entire colon and rectum.  Hepatitis C blood test.  Hepatitis B blood test.  Sexually transmitted disease (STD) testing.  Diabetes screening. This is done by checking your blood sugar (glucose) after you have not eaten for a while (fasting). You may have this done every 1-3 years.  Bone density scan. This is done to screen for osteoporosis. You may have this done starting at age 37.  Mammogram. This may be done every 1-2 years. Talk to your health care provider about how often you should have regular mammograms. Talk with your health care provider about your test results, treatment options, and if necessary, the need for more tests. Vaccines Your health care provider may recommend certain vaccines, such as:  Influenza vaccine. This is recommended every year.  Tetanus, diphtheria, and acellular pertussis (Tdap, Td) vaccine. You may need a Td booster every 10 years.  Varicella vaccine. You may need this if you have not been vaccinated.  Zoster vaccine. You may need this after age 38.  Measles, mumps, and rubella (MMR) vaccine. You may need at least  one dose of MMR if you were born in 1957 or later. You may also need a second dose.  Pneumococcal 13-valent conjugate (PCV13) vaccine. One dose is recommended after age 24.  Pneumococcal polysaccharide (PPSV23) vaccine. One dose is recommended after age 24.  Meningococcal  vaccine. You may need this if you have certain conditions.  Hepatitis A vaccine. You may need this if you have certain conditions or if you travel or work in places where you may be exposed to hepatitis A.  Hepatitis B vaccine. You may need this if you have certain conditions or if you travel or work in places where you may be exposed to hepatitis B.  Haemophilus influenzae type b (Hib) vaccine. You may need this if you have certain conditions. Talk to your health care provider about which screenings and vaccines you need and how often you need them. This information is not intended to replace advice given to you by your health care provider. Make sure you discuss any questions you have with your health care provider. Document Released: 09/29/2015 Document Revised: 10/23/2017 Document Reviewed: 07/04/2015 Elsevier Interactive Patient Education  2019 [employer].

## 2019-03-02 NOTE — Progress Notes (Signed)
MEDICARE ANNUAL WELLNESS VISIT  03/02/2019  Telephone Visit Disclaimer This Medicare AWV was conducted by telephone due to national recommendations for restrictions regarding the COVID-19 Pandemic (e.g. social distancing).  I verified, using two identifiers, that I am speaking with Jeanette Daniels or their authorized healthcare agent. I discussed the limitations, risks, security, and privacy concerns of performing an evaluation and management service by telephone and the potential availability of an in-person appointment in the future. The patient expressed understanding and agreed to proceed.   Subjective:  Jeanette Daniels is a 78 y.o. female patient of Hawks, Theador Hawthorne, FNP who had a Medicare Annual Wellness Visit today via telephone. Jeanette Daniels is Working full time and lives with their spouse. she has 2 children that are deceased. she reports that she is socially active and does interact with friends/family regularly. she is minimally physically active and enjoys reading.  Patient Care Team: Sharion Balloon, FNP as PCP - General (Nurse Practitioner)  Advanced Directives 03/02/2019  Does Patient Have a Medical Advance Directive? No  Would patient like information on creating a medical advance directive? No - Patient declined    Hospital Utilization Over the Past 12 Months: # of hospitalizations or ER visits: 0 # of surgeries: 1  Review of Systems    Patient reports that her overall health is better compared to last year.  Patient Reported Readings (BP, Pulse, CBG, Weight, etc) none  Review of Systems: No complaints  All other systems negative.  Pain Assessment Pain : No/denies pain     Current Medications & Allergies (verified) Allergies as of 03/02/2019      Reactions   Ace Inhibitors Cough   Augmentin [amoxicillin-pot Clavulanate] Diarrhea   Nausea   Celecoxib       Medication List       Accurate as of March 02, 2019  1:53 PM. If you have any questions, ask your nurse  or doctor.        STOP taking these medications   telmisartan 80 MG tablet Commonly known as: MICARDIS     TAKE these medications   aspirin EC 81 MG tablet Take 1 tablet (81 mg total) by mouth daily.   atorvastatin 20 MG tablet Commonly known as: LIPITOR Take 1 tablet (20 mg total) by mouth daily.   docusate sodium 100 MG capsule Commonly known as: COLACE Take 100 mg by mouth daily.   losartan 50 MG tablet Commonly known as: COZAAR Take 2 tablets (100 mg total) by mouth daily.   metFORMIN 500 MG tablet Commonly known as: GLUCOPHAGE TAKE 1 TABLET BY MOUTH TWICE DAILY WITH MEALS   metoprolol succinate 100 MG 24 hr tablet Commonly known as: TOPROL-XL TAKE 1 TABLET BY MOUTH TWICE DAILY   ONE TOUCH ULTRA TEST test strip Generic drug: glucose blood USE TO CHECK BLOOD SUGAR UP TO 4 TIMES DAILY   sertraline 100 MG tablet Commonly known as: ZOLOFT Take 2 tablets (200 mg total) by mouth daily.   Vitamin D 50 MCG (2000 UT) Caps Take 1,000 Units by mouth 2 (two) times daily. gummie chewable   zolpidem 10 MG tablet Commonly known as: AMBIEN Take 1 tablet (10 mg total) by mouth at bedtime.       History (reviewed): Past Medical History:  Diagnosis Date  . Breast cancer ([place])    S/P chemotherapy, radiation, and surgery  . Diabetes mellitus without complication ([place])   . Hyperlipidemia    x16 years  . Hypertension  x16 years   Past Surgical History:  Procedure Laterality Date  . MASTECTOMY     Left  . TOTAL KNEE ARTHROPLASTY     Right   Family History  Problem Relation Age of Onset  . Alzheimer's disease Father   . Diabetes Father   . Hypertension Sister   . Diabetes Brother   . Hypertension Brother   . Leukemia Son    Social History   Socioeconomic History  . Marital status: Married    Spouse name: Jori Moll  . Number of children: 2  . Years of education: 76  . Highest education level: High school graduate  Occupational History  . 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 Financial resource strain: Not hard at all  . Food insecurity    Worry: Never true    Inability: Never true  . Transportation needs    Medical: No    Non-medical: No  Tobacco Use  . Smoking status: Never Smoker  . Smokeless tobacco: Never Used  Substance and Sexual Activity  . Alcohol use: No  . Drug use: No  . Sexual activity: Yes    Birth control/protection: Post-menopausal  Lifestyle  . Physical activity    Days per week: 2 days    Minutes per session: 20 min  . Stress: Not at all  Relationships  . Social connections    Talks on phone: More than three times a week    Gets together: More than three times a week    Attends religious service: More than 4 times per year    Active member of club or organization: Yes    Attends meetings of clubs or organizations: More than 4 times per year    Relationship status: Married  Other Topics Concern  . Not on file  Social History Narrative   Married with 2 children    Activities of Daily Living In your present state of health, do you have any difficulty performing the following activities: 03/02/2019  Hearing? N  Vision? N  Difficulty concentrating or making decisions? N  Walking or climbing stairs? N  Dressing or bathing? N  Doing errands, shopping? N  Preparing Food and eating ? N  Using the Toilet? N  In the past six months, have you accidently leaked urine? Y  Comment wears a pad  Do you have problems with loss of bowel control? N  Managing your Medications? N  Managing your Finances? N  Housekeeping or managing your Housekeeping? N  Some recent data might be hidden    Patient Literacy How often do you need to have someone help you when you read instructions, pamphlets, or other written materials from your doctor or pharmacy?: 1 - Never What is the last grade level you completed in school?: 12th grade  Exercise Current Exercise  Habits: Home exercise routine, Type of exercise: walking, Time (Minutes): 15, Frequency (Times/Week): 2, Weekly Exercise (Minutes/Week): 30, Intensity: Mild, Exercise limited by: None identified  Diet Patient reports consuming 2 meals a day and 1 snack(s) a day Patient reports that her primary diet is: Regular Patient reports that she does have regular access to food.   Depression Screen PHQ 2/9 Scores 03/02/2019 08/04/2018 05/11/2018 11/20/2017 11/11/2017 07/11/2017 01/09/2017  PHQ - 2 Score 0 0 0 0 0 0 0     Fall Risk Fall Risk  03/02/2019 08/04/2018 05/11/2018 11/20/2017 11/11/2017  Falls in the past year? 0 1 Yes Yes  No  Number falls in past yr: - 0 1 - -  Injury with Fall? - 0 No - -  Comment - Bruising. Bruising - -     Objective:  Jeanette Daniels seemed alert and oriented and she participated appropriately during our telephone visit.  Blood Pressure Weight BMI  BP Readings from Last 3 Encounters:  08/04/18 106/66  05/11/18 114/65  11/20/17 137/85   Wt Readings from Last 3 Encounters:  08/04/18 214 lb 9.6 oz (97.3 kg)  05/11/18 209 lb 9.6 oz (95.1 kg)  11/20/17 211 lb (95.7 kg)   BMI Readings from Last 1 Encounters:  08/04/18 33.61 kg/m    *Unable to obtain current vital signs, weight, and BMI due to telephone visit type  Hearing/Vision  . Jeanette Daniels did not seem to have difficulty with hearing/understanding during the telephone conversation . Reports that she has not had a formal eye exam by an eye care professional within the past year . Reports that she has not had a formal hearing evaluation within the past year *Unable to fully assess hearing and vision during telephone visit type  Cognitive Function: 6CIT Screen 03/02/2019  What Year? 0 points  What month? 0 points  What time? 0 points  Count back from 20 0 points  Months in reverse 0 points  Repeat phrase 0 points  Total Score 0   (Normal:0-7, Significant for Dysfunction: >8)  Normal Cognitive Function Screening: Yes    Immunization & Health Maintenance Record Immunization History  Administered Date(s) Administered  . Hepatitis B 07/28/1998, 08/28/1998, 01/29/1999  . Influenza Whole 07/23/2012  . Influenza, High Dose Seasonal PF 07/11/2017, 06/10/2018  . Influenza,inj,Quad PF,6+ Mos 06/23/2013, 07/11/2014, 07/11/2015, 07/10/2016  . Pneumococcal Conjugate-13 05/24/2014  . Pneumococcal Polysaccharide-23 06/16/2010  . Tdap 02/14/2009, 03/04/2011  . Zoster 09/16/2005    Health Maintenance  Topic Date Due  . OPHTHALMOLOGY EXAM  04/11/2017  . FOOT EXAM  01/09/2018  . HEMOGLOBIN A1C  11/11/2018  . INFLUENZA VACCINE  04/17/2019  . TETANUS/TDAP  03/03/2021  . DEXA SCAN  Completed  . PNA vac Low Risk Adult  Completed       Assessment  This is a routine wellness examination for Jeanette Daniels.  Health Maintenance: Due or Overdue Health Maintenance Due  Topic Date Due  . OPHTHALMOLOGY EXAM  04/11/2017  . FOOT EXAM  01/09/2018  . HEMOGLOBIN A1C  11/11/2018    Jeanette Daniels does not need a referral for Community Assistance: Care Management:   no Social Work:    no Prescription Assistance:  no Nutrition/Diabetes Education:  no   Plan:  Personalized Goals Goals Addressed            This Visit's Progress   . DIET - INCREASE WATER INTAKE        Personalized Health Maintenance & Screening Recommendations  Diabetic Eye Exam  Lung Cancer Screening Recommended: no (Low Dose CT Chest recommended if Age 76-80 years, 30 pack-year currently smoking OR have quit w/in past 15 years) Hepatitis C Screening recommended: no HIV Screening recommended: no  Advanced Directives: Written information was not prepared per patient's request.  Referrals & Orders No orders of the defined types were placed in this encounter.   Follow-up Plan . Follow-up with Sharion Balloon, FNP as planned . Schedule your Diabetic Eye Exam as soon as your eye doctor is taking appts again.    I have personally  reviewed and noted the following in the patient's chart:   .  Medical and social history . Use of alcohol, tobacco or illicit drugs  . Current medications and supplements . Functional ability and status . Nutritional status . Physical activity . Advanced directives . List of other physicians . Hospitalizations, surgeries, and ER visits in previous 12 months . Vitals . Screenings to include cognitive, depression, and falls . Referrals and appointments  In addition, I have reviewed and discussed with Jeanette Daniels certain preventive protocols, quality metrics, and best practice recommendations. A written personalized care plan for preventive services as well as general preventive health recommendations is available and can be mailed to the patient at her request.      Marylin Crosby, LPN  [phone]

## 2019-03-15 DIAGNOSIS — Z170 Estrogen receptor positive status [ER+]: Secondary | ICD-10-CM | POA: Diagnosis not present

## 2019-03-15 DIAGNOSIS — Z1231 Encounter for screening mammogram for malignant neoplasm of breast: Secondary | ICD-10-CM | POA: Diagnosis not present

## 2019-03-15 DIAGNOSIS — C50912 Malignant neoplasm of unspecified site of left female breast: Secondary | ICD-10-CM | POA: Diagnosis not present

## 2019-03-23 ENCOUNTER — Other Ambulatory Visit: Payer: Self-pay | Admitting: Family

## 2019-03-25 ENCOUNTER — Ambulatory Visit: Payer: BC Managed Care – PPO | Admitting: Family

## 2019-03-25 ENCOUNTER — Other Ambulatory Visit: Payer: Self-pay | Admitting: Family

## 2019-03-29 ENCOUNTER — Other Ambulatory Visit: Payer: Self-pay | Admitting: Family

## 2019-04-06 ENCOUNTER — Other Ambulatory Visit: Payer: Self-pay | Admitting: Family

## 2019-04-06 DIAGNOSIS — F411 Generalized anxiety disorder: Secondary | ICD-10-CM

## 2019-04-13 ENCOUNTER — Ambulatory Visit: Payer: Self-pay | Admitting: Family

## 2019-04-19 ENCOUNTER — Other Ambulatory Visit: Payer: Self-pay | Admitting: Family

## 2019-04-20 ENCOUNTER — Other Ambulatory Visit: Payer: Self-pay | Admitting: Family

## 2019-04-20 NOTE — Telephone Encounter (Signed)
What is the name of the medication? Zolpidem 10 mg  Have you contacted your pharmacy to request a refill? YES  Which [facility]   Patient notified that their request is being sent to the clinical staff for review and that they should receive a call once it is complete. If they do not receive a call within 24 hours they can check with their pharmacy or our office.

## 2019-04-21 NOTE — Telephone Encounter (Signed)
Pt needed 3 mos ckup appt made for 04/22/19

## 2019-04-22 ENCOUNTER — Encounter: Payer: Self-pay | Admitting: Family

## 2019-04-22 ENCOUNTER — Ambulatory Visit (INDEPENDENT_AMBULATORY_CARE_PROVIDER_SITE_OTHER): Payer: BC Managed Care – PPO | Admitting: Family

## 2019-04-22 ENCOUNTER — Other Ambulatory Visit: Payer: Self-pay

## 2019-04-22 VITALS — BP 116/70 | HR 64 | Temp 96.0°F | Ht 67.0 in | Wt 207.0 lb

## 2019-04-22 DIAGNOSIS — E1159 Type 2 diabetes mellitus with other circulatory complications: Secondary | ICD-10-CM

## 2019-04-22 DIAGNOSIS — G4700 Insomnia, unspecified: Secondary | ICD-10-CM

## 2019-04-22 DIAGNOSIS — I152 Hypertension secondary to endocrine disorders: Secondary | ICD-10-CM

## 2019-04-22 DIAGNOSIS — E1169 Type 2 diabetes mellitus with other specified complication: Secondary | ICD-10-CM | POA: Diagnosis not present

## 2019-04-22 DIAGNOSIS — M546 Pain in thoracic spine: Secondary | ICD-10-CM

## 2019-04-22 DIAGNOSIS — I10 Essential (primary) hypertension: Secondary | ICD-10-CM

## 2019-04-22 DIAGNOSIS — K5900 Constipation, unspecified: Secondary | ICD-10-CM

## 2019-04-22 DIAGNOSIS — E559 Vitamin D deficiency, unspecified: Secondary | ICD-10-CM

## 2019-04-22 DIAGNOSIS — E669 Obesity, unspecified: Secondary | ICD-10-CM

## 2019-04-22 DIAGNOSIS — F411 Generalized anxiety disorder: Secondary | ICD-10-CM | POA: Diagnosis not present

## 2019-04-22 DIAGNOSIS — E785 Hyperlipidemia, unspecified: Secondary | ICD-10-CM

## 2019-04-22 LAB — MICROSCOPIC EXAMINATION: Renal Epithel, UA: NONE SEEN /hpf

## 2019-04-22 LAB — URINALYSIS, COMPLETE
Bilirubin, UA: NEGATIVE
Glucose, UA: NEGATIVE
Ketones, UA: NEGATIVE
Nitrite, UA: NEGATIVE
Protein,UA: NEGATIVE
Specific Gravity, UA: 1.025 (ref 1.005–1.030)
Urobilinogen, Ur: 0.2 mg/dL (ref 0.2–1.0)
pH, UA: 5.5 (ref 5.0–7.5)

## 2019-04-22 LAB — BAYER DCA HB A1C WAIVED: HB A1C (BAYER DCA - WAIVED): 5.7 % (ref <7.0)

## 2019-04-22 MED ORDER — ZOLPIDEM TARTRATE 10 MG PO TABS: 10.0000 mg | ORAL_TABLET | Freq: Every day | ORAL | 4 refills | Status: DC

## 2019-04-22 NOTE — Patient Instructions (Signed)
Acute Back Pain, Adult Acute back pain is sudden and usually short-lived. It is often caused by an injury to the muscles and tissues in the back. The injury may result from:  A muscle or ligament getting overstretched or torn (strained). Ligaments are tissues that connect bones to each other. Lifting something improperly can cause a back strain.  Wear and tear (degeneration) of the spinal disks. Spinal disks are circular tissue that provides cushioning between the bones of the spine (vertebrae).  Twisting motions, such as while playing sports or doing yard work.  A hit to the back.  Arthritis. You may have a physical exam, lab tests, and imaging tests to find the cause of your pain. Acute back pain usually goes away with rest and home care. Follow these instructions at home: Managing pain, stiffness, and swelling  Take over-the-counter and prescription medicines only as told by your health care provider.  Your health care provider may recommend applying ice during the first 24-48 hours after your pain starts. To do this: ? Put ice in a plastic bag. ? Place a towel between your skin and the bag. ? Leave the ice on for 20 minutes, 2-3 times a day.  If directed, apply heat to the affected area as often as told by your health care provider. Use the heat source that your health care provider recommends, such as a moist heat pack or a heating pad. ? Place a towel between your skin and the heat source. ? Leave the heat on for 20-30 minutes. ? Remove the heat if your skin turns bright red. This is especially important if you are unable to feel pain, heat, or cold. You have a greater risk of getting burned. Activity   Do not stay in bed. Staying in bed for more than 1-2 days can delay your recovery.  Sit up and stand up straight. Avoid leaning forward when you sit, or hunching over when you stand. ? If you work at a desk, sit close to it so you do not need to lean over. Keep your chin tucked  in. Keep your neck drawn back, and keep your elbows bent at a right angle. Your arms should look like the letter "L." ? Sit high and close to the steering wheel when you drive. Add lower back (lumbar) support to your car seat, if needed.  Take short walks on even surfaces as soon as you are able. Try to increase the length of time you walk each day.  Do not sit, drive, or stand in one place for more than 30 minutes at a time. Sitting or standing for long periods of time can put stress on your back.  Do not drive or use heavy machinery while taking prescription pain medicine.  Use proper lifting techniques. When you bend and lift, use positions that put less stress on your back: ? Bend your knees. ? Keep the load close to your body. ? Avoid twisting.  Exercise regularly as told by your health care provider. Exercising helps your back heal faster and helps prevent back injuries by keeping muscles strong and flexible.  Work with a physical therapist to make a safe exercise program, as recommended by your health care provider. Do any exercises as told by your physical therapist. Lifestyle  Maintain a healthy weight. Extra weight puts stress on your back and makes it difficult to have good posture.  Avoid activities or situations that make you feel anxious or stressed. Stress and anxiety increase muscle   tension and can make back pain worse. Learn ways to manage anxiety and stress, such as through exercise. General instructions  Sleep on a firm mattress in a comfortable position. Try lying on your side with your knees slightly bent. If you lie on your back, put a pillow under your knees.  Follow your treatment plan as told by your health care provider. This may include: ? Cognitive or behavioral therapy. ? Acupuncture or massage therapy. ? Meditation or yoga. Contact a health care provider if:  You have pain that is not relieved with rest or medicine.  You have increasing pain going down  into your legs or buttocks.  Your pain does not improve after 2 weeks.  You have pain at night.  You lose weight without trying.  You have a fever or chills. Get help right away if:  You develop new bowel or bladder control problems.  You have unusual weakness or numbness in your arms or legs.  You develop nausea or vomiting.  You develop abdominal pain.  You feel faint. Summary  Acute back pain is sudden and usually short-lived.  Use proper lifting techniques. When you bend and lift, use positions that put less stress on your back.  Take over-the-counter and prescription medicines and apply heat or ice as directed by your health care provider. This information is not intended to replace advice given to you by your health care provider. Make sure you discuss any questions you have with your health care provider. Document Released: 09/02/2005 Document Revised: 12/22/2018 Document Reviewed: 04/16/2017 Elsevier Patient Education  2020 [employer]  

## 2019-04-22 NOTE — Progress Notes (Signed)
Subjective:    Patient ID: Jeanette Daniels, female    DOB: Apr 06, 1941, 78 y.o.   MRN: [id number]  Chief Complaint  Patient presents with  . Medical Management of Chronic Issues   Pt presents to the office today for chronic follow up. Diabetes She presents for her follow-up diabetic visit. She has type 2 diabetes mellitus. Her disease course has been stable. Hypoglycemia symptoms include nervousness/anxiousness. Pertinent negatives for hypoglycemia include no headaches. Pertinent negatives for diabetes include no blurred vision, no foot paresthesias, no visual change and no weakness. There are no hypoglycemic complications. Symptoms are stable. Pertinent negatives for diabetic complications include no CVA, heart disease or nephropathy. Risk factors for coronary artery disease include dyslipidemia, diabetes mellitus, hypertension, sedentary lifestyle and post-menopausal. She is following a generally healthy diet. Her overall blood glucose range is 110-130 mg/dl.  Hypertension This is a chronic problem. The current episode started more than 1 year ago. The problem has been resolved since onset. The problem is controlled. Associated symptoms include anxiety. Pertinent negatives include no blurred vision, headaches, malaise/fatigue, peripheral edema or shortness of breath. Risk factors for coronary artery disease include dyslipidemia, diabetes mellitus, obesity and sedentary lifestyle. The current treatment provides moderate improvement. There is no history of CAD/MI or CVA.  Hyperlipidemia This is a chronic problem. The current episode started more than 1 year ago. The problem is controlled. Recent lipid tests were reviewed and are normal. Exacerbating diseases include obesity. Pertinent negatives include no leg pain or shortness of breath. Current antihyperlipidemic treatment includes statins. The current treatment provides moderate improvement of lipids. Risk factors for coronary artery disease include  dyslipidemia, diabetes mellitus, hypertension, post-menopausal and a sedentary lifestyle.  Insomnia Primary symptoms: no malaise/fatigue.  The current episode started more than one year. The onset quality is gradual. The problem occurs intermittently. The problem has been waxing and waning since onset.  Anxiety Presents for follow-up visit. Symptoms include decreased concentration, excessive worry, insomnia, nervous/anxious behavior and restlessness. Patient reports no shortness of breath. Symptoms occur most days. The severity of symptoms is moderate.    Constipation This is a chronic problem. The current episode started more than 1 year ago. The problem has been waxing and waning since onset. Associated symptoms include back pain. Risk factors include obesity. She has tried laxatives for the symptoms. The treatment provided moderate relief.  Back Pain This is a new problem. Episode onset: after lifting a flower pot. The problem occurs intermittently. The problem has been waxing and waning since onset. The pain is present in the thoracic spine. The quality of the pain is described as aching. The pain is at a severity of 4/10. The pain is mild. Pertinent negatives include no bladder incontinence, bowel incontinence, headaches, leg pain, tingling or weakness. She has tried ice for the symptoms. The treatment provided mild relief.      Review of Systems  Constitutional: Negative for malaise/fatigue.  Eyes: Negative for blurred vision.  Respiratory: Negative for shortness of breath.   Gastrointestinal: Positive for constipation. Negative for bowel incontinence.  Genitourinary: Negative for bladder incontinence.  Musculoskeletal: Positive for back pain.  Neurological: Negative for tingling, weakness and headaches.  Psychiatric/Behavioral: Positive for decreased concentration. The patient is nervous/anxious and has insomnia.   All other systems reviewed and are negative.      Objective:    Physical Exam Vitals signs reviewed.  Constitutional:      General: She is not in acute distress.    Appearance: She is  well-developed. She is obese.  HENT:     Head: Normocephalic and atraumatic.     Right Ear: Tympanic membrane normal.     Left Ear: Tympanic membrane normal.  Eyes:     Pupils: Pupils are equal, round, and reactive to light.  Neck:     Musculoskeletal: Normal range of motion and neck supple.     Thyroid: No thyromegaly.  Cardiovascular:     Rate and Rhythm: Normal rate and regular rhythm.     Heart sounds: Normal heart sounds. No murmur.  Pulmonary:     Effort: Pulmonary effort is normal. No respiratory distress.     Breath sounds: Normal breath sounds. No wheezing.  Abdominal:     General: Bowel sounds are normal. There is no distension.     Palpations: Abdomen is soft.     Tenderness: There is no abdominal tenderness.  Musculoskeletal: Normal range of motion.        General: Tenderness present.     Comments: Left Thoracic tenderness with flexion  Skin:    General: Skin is warm and dry.  Neurological:     Mental Status: She is alert and oriented to person, place, and time.     Cranial Nerves: No cranial nerve deficit.     Deep Tendon Reflexes: Reflexes are normal and symmetric.  Psychiatric:        Behavior: Behavior normal.        Thought Content: Thought content normal.        Judgment: Judgment normal.     BP 116/70   Pulse 64   Temp (!) 96 F (35.6 C) (Oral)   Ht '5\' 7"'  (1.702 m)   Wt 207 lb (93.9 kg)   BMI 32.42 kg/m      Assessment & Plan:  Jeanette Daniels comes in today with chief complaint of Medical Management of Chronic Issues   Diagnosis and orders addressed:  1. Hypertension associated with diabetes ([place]) - CMP14+EGFR - CBC with Differential/Platelet  2. Hyperlipidemia associated with type 2 diabetes mellitus ([facility]) - CMP14+EGFR - CBC with Differential/Platelet  3. Type 2 diabetes mellitus with other specified complication,  without long-term current use of insulin ([facility]) - Bayer DCA Hb A1c Waived - CMP14+EGFR - CBC with Differential/Platelet - Microalbumin / creatinine urine ratio  4. Constipation, unspecified constipation type - CMP14+EGFR - CBC with Differential/Platelet  5. GAD (generalized anxiety disorder) - CMP14+EGFR - CBC with Differential/Platelet  6. Insomnia, unspecified type PT reviewed in  controlled database- No red flags noted - CMP14+EGFR - CBC with Differential/Platelet - zolpidem (AMBIEN) 10 MG tablet; Take 1 tablet (10 mg total) by mouth at bedtime.  Dispense: 30 tablet; Refill: 4  7. Obesity (BMI 30-39.9) - CMP14+EGFR - CBC with Differential/Platelet  8. Vitamin D deficiency - CMP14+EGFR - CBC with Differential/Platelet - VITAMIN D 25 Hydroxy (Vit-D Deficiency, Fractures)  9. Acute left-sided thoracic back pain Rest Ice - Urinalysis, Complete   Labs pending Health Maintenance reviewed Diet and exercise encouraged  Follow up plan: 6 months    Evelina Dun, FNP

## 2019-04-22 NOTE — Addendum Note (Signed)
Addended by: Evelina Dun A on: 04/22/2019 02:00 PM   Modules accepted: Orders

## 2019-04-23 ENCOUNTER — Other Ambulatory Visit: Payer: Self-pay | Admitting: Family

## 2019-04-23 LAB — CMP14+EGFR
ALT: 11 IU/L (ref 0–32)
AST: 14 IU/L (ref 0–40)
Albumin/Globulin Ratio: 1.6 (ref 1.2–2.2)
Albumin: 4.4 g/dL (ref 3.7–4.7)
Alkaline Phosphatase: 79 IU/L (ref 39–117)
BUN/Creatinine Ratio: 17 (ref 12–28)
BUN: 12 mg/dL (ref 8–27)
Bilirubin Total: 0.4 mg/dL (ref 0.0–1.2)
CO2: 22 mmol/L (ref 20–29)
Calcium: 9.8 mg/dL (ref 8.7–10.3)
Chloride: 104 mmol/L (ref 96–106)
Creatinine, Ser: 0.69 mg/dL (ref 0.57–1.00)
GFR calc Af Amer: 97 mL/min/{1.73_m2} (ref >59)
GFR calc non Af Amer: 84 mL/min/{1.73_m2} (ref >59)
Globulin, Total: 2.7 g/dL (ref 1.5–4.5)
Glucose: 108 mg/dL — ABNORMAL HIGH (ref 65–99)
Potassium: 4.8 mmol/L (ref 3.5–5.2)
Sodium: 144 mmol/L (ref 134–144)
Total Protein: 7.1 g/dL (ref 6.0–8.5)

## 2019-04-23 LAB — CBC WITH DIFFERENTIAL/PLATELET
Basophils Absolute: 0 10*3/uL (ref 0.0–0.2)
Basos: 0 %
EOS (ABSOLUTE): 0.1 10*3/uL (ref 0.0–0.4)
Eos: 2 %
Hematocrit: 41 % (ref 34.0–46.6)
Hemoglobin: 13.7 g/dL (ref 11.1–15.9)
Immature Grans (Abs): 0 10*3/uL (ref 0.0–0.1)
Immature Granulocytes: 0 %
Lymphocytes Absolute: 1.4 10*3/uL (ref 0.7–3.1)
Lymphs: 19 %
MCH: 30.2 pg (ref 26.6–33.0)
MCHC: 33.4 g/dL (ref 31.5–35.7)
MCV: 91 fL (ref 79–97)
Monocytes Absolute: 0.4 10*3/uL (ref 0.1–0.9)
Monocytes: 6 %
Neutrophils Absolute: 5.2 10*3/uL (ref 1.4–7.0)
Neutrophils: 73 %
Platelets: 220 10*3/uL (ref 150–450)
RBC: 4.53 x10E6/uL (ref 3.77–5.28)
RDW: 12.1 % (ref 11.7–15.4)
WBC: 7.2 10*3/uL (ref 3.4–10.8)

## 2019-04-23 LAB — MICROALBUMIN / CREATININE URINE RATIO
Creatinine, Urine: 139.3 mg/dL
Microalb/Creat Ratio: 10 mg/g creat (ref 0–29)
Microalbumin, Urine: 14.5 ug/mL

## 2019-04-23 LAB — VITAMIN D 25 HYDROXY (VIT D DEFICIENCY, FRACTURES): Vit D, 25-Hydroxy: 24.6 ng/mL — ABNORMAL LOW (ref 30.0–100.0)

## 2019-04-23 MED ORDER — VITAMIN D (ERGOCALCIFEROL) 1.25 MG (50000 UNIT) PO CAPS: 50000.0000 [IU] | ORAL_CAPSULE | ORAL | 3 refills | Status: DC

## 2019-04-26 LAB — SPECIMEN STATUS REPORT

## 2019-04-29 ENCOUNTER — Other Ambulatory Visit: Payer: Self-pay | Admitting: Family

## 2019-05-19 ENCOUNTER — Other Ambulatory Visit: Payer: Self-pay | Admitting: Family

## 2019-06-04 ENCOUNTER — Other Ambulatory Visit: Payer: Self-pay

## 2019-06-07 ENCOUNTER — Ambulatory Visit (INDEPENDENT_AMBULATORY_CARE_PROVIDER_SITE_OTHER): Payer: BC Managed Care – PPO

## 2019-06-07 ENCOUNTER — Other Ambulatory Visit: Payer: Self-pay

## 2019-06-07 DIAGNOSIS — Z23 Encounter for immunization: Secondary | ICD-10-CM | POA: Diagnosis not present

## 2019-06-17 ENCOUNTER — Other Ambulatory Visit: Payer: Self-pay | Admitting: Family

## 2019-07-06 ENCOUNTER — Other Ambulatory Visit: Payer: Self-pay | Admitting: Family

## 2019-07-06 DIAGNOSIS — F411 Generalized anxiety disorder: Secondary | ICD-10-CM

## 2019-07-27 ENCOUNTER — Other Ambulatory Visit: Payer: Self-pay | Admitting: Family

## 2019-08-04 ENCOUNTER — Other Ambulatory Visit: Payer: Self-pay | Admitting: Family

## 2019-08-31 ENCOUNTER — Other Ambulatory Visit: Payer: Self-pay | Admitting: Family

## 2019-09-05 DIAGNOSIS — Z20828 Contact with and (suspected) exposure to other viral communicable diseases: Secondary | ICD-10-CM | POA: Diagnosis not present

## 2019-09-05 DIAGNOSIS — R509 Fever, unspecified: Secondary | ICD-10-CM | POA: Diagnosis not present

## 2019-09-05 DIAGNOSIS — R05 Cough: Secondary | ICD-10-CM | POA: Diagnosis not present

## 2019-09-05 DIAGNOSIS — R432 Parageusia: Secondary | ICD-10-CM | POA: Diagnosis not present

## 2019-09-21 ENCOUNTER — Other Ambulatory Visit: Payer: Self-pay | Admitting: Family

## 2019-09-21 DIAGNOSIS — G4700 Insomnia, unspecified: Secondary | ICD-10-CM

## 2019-10-04 ENCOUNTER — Other Ambulatory Visit: Payer: Self-pay | Admitting: Family

## 2019-10-04 DIAGNOSIS — F411 Generalized anxiety disorder: Secondary | ICD-10-CM

## 2019-10-08 DIAGNOSIS — Z029 Encounter for administrative examinations, unspecified: Secondary | ICD-10-CM

## 2019-10-26 ENCOUNTER — Ambulatory Visit: Payer: BC Managed Care – PPO | Admitting: Family

## 2019-10-26 DIAGNOSIS — M25561 Pain in right knee: Secondary | ICD-10-CM | POA: Diagnosis not present

## 2019-10-27 ENCOUNTER — Ambulatory Visit: Payer: BC Managed Care – PPO | Admitting: Family

## 2019-10-27 ENCOUNTER — Ambulatory Visit (INDEPENDENT_AMBULATORY_CARE_PROVIDER_SITE_OTHER): Payer: BC Managed Care – PPO | Admitting: Family

## 2019-10-27 ENCOUNTER — Encounter: Payer: Self-pay | Admitting: Family

## 2019-10-27 VITALS — BP 119/76

## 2019-10-27 DIAGNOSIS — E559 Vitamin D deficiency, unspecified: Secondary | ICD-10-CM | POA: Diagnosis not present

## 2019-10-27 DIAGNOSIS — E669 Obesity, unspecified: Secondary | ICD-10-CM

## 2019-10-27 DIAGNOSIS — E785 Hyperlipidemia, unspecified: Secondary | ICD-10-CM | POA: Diagnosis not present

## 2019-10-27 DIAGNOSIS — E1159 Type 2 diabetes mellitus with other circulatory complications: Secondary | ICD-10-CM

## 2019-10-27 DIAGNOSIS — G4700 Insomnia, unspecified: Secondary | ICD-10-CM | POA: Diagnosis not present

## 2019-10-27 DIAGNOSIS — E1169 Type 2 diabetes mellitus with other specified complication: Secondary | ICD-10-CM | POA: Diagnosis not present

## 2019-10-27 DIAGNOSIS — I152 Hypertension secondary to endocrine disorders: Secondary | ICD-10-CM

## 2019-10-27 DIAGNOSIS — K5900 Constipation, unspecified: Secondary | ICD-10-CM

## 2019-10-27 DIAGNOSIS — F411 Generalized anxiety disorder: Secondary | ICD-10-CM

## 2019-10-27 DIAGNOSIS — I10 Essential (primary) hypertension: Secondary | ICD-10-CM

## 2019-10-27 MED ORDER — ACYCLOVIR 5 % EX OINT: 1.0000 "application " | TOPICAL_OINTMENT | CUTANEOUS | 2 refills | Status: DC

## 2019-10-27 MED ORDER — ZOLPIDEM TARTRATE 10 MG PO TABS: 10.0000 mg | ORAL_TABLET | Freq: Every day | ORAL | 5 refills | Status: DC

## 2019-10-27 NOTE — Progress Notes (Signed)
Virtual Visit via telephone Note Due to COVID-19 pandemic this visit was conducted virtually. This visit type was conducted due to national recommendations for restrictions regarding the COVID-19 Pandemic (e.g. social distancing, sheltering in place) in an effort to limit this patient's exposure and mitigate transmission in our community. All issues noted in this document were discussed and addressed.  A physical exam was not performed with this format.  I connected with Jeanette Daniels on 10/27/19 at 2:00 pm by telephone and verified that I am speaking with the correct person using two identifiers. Jeanette Daniels is currently located at home and no one is currently with her during visit. The provider, Evelina Dun, FNP is located in their office at time of visit.  I discussed the limitations, risks, security and privacy concerns of performing an evaluation and management service by telephone and the availability of in person appointments. I also discussed with the patient that there may be a patient responsible charge related to this service. The patient expressed understanding and agreed to proceed.   History and Present Illness:  Pt calls  the office today for chronic follow up. Pt reports she had COVID in 08/2019 and is doing well. She had her first COVID vaccine 10/06/19.  Diabetes She presents for her follow-up diabetic visit. She has type 2 diabetes mellitus. Her disease course has been stable. Pertinent negatives for diabetes include no blurred vision, no fatigue and no foot ulcerations. There are no hypoglycemic complications. Symptoms are stable. Pertinent negatives for diabetic complications include no CVA or heart disease. Risk factors for coronary artery disease include diabetes mellitus, dyslipidemia and hypertension. Her weight is stable. She is following a generally healthy diet. Her overall blood glucose range is 90-110 mg/dl. Eye exam is current.  Hypertension This is a chronic  problem. The current episode started more than 1 year ago. The problem has been waxing and waning since onset. The problem is controlled. Associated symptoms include anxiety and malaise/fatigue. Pertinent negatives include no blurred vision or peripheral edema. Risk factors for coronary artery disease include dyslipidemia, diabetes mellitus, obesity and sedentary lifestyle. The current treatment provides moderate improvement. There is no history of kidney disease, CAD/MI or CVA.  Hyperlipidemia This is a chronic problem. The current episode started more than 1 year ago. The problem is controlled. Recent lipid tests were reviewed and are normal. The current treatment provides moderate improvement of lipids. Risk factors for coronary artery disease include post-menopausal, hypertension, dyslipidemia and diabetes mellitus.  Anxiety Presents for follow-up visit. Symptoms include decreased concentration, excessive worry and insomnia. Symptoms occur most days. The quality of sleep is good.    Insomnia Primary symptoms: difficulty falling asleep, malaise/fatigue.  The current episode started more than one year. The onset quality is gradual. The problem occurs intermittently.  Constipation This is a chronic problem. The current episode started more than 1 year ago. The problem has been waxing and waning since onset. Pertinent negatives include no back pain, bloating or diarrhea. She has tried diet changes for the symptoms. The treatment provided mild relief.      Review of Systems  Constitutional: Positive for malaise/fatigue. Negative for fatigue.  Eyes: Negative for blurred vision.  Gastrointestinal: Positive for constipation. Negative for bloating and diarrhea.  Musculoskeletal: Negative for back pain.  Psychiatric/Behavioral: Positive for decreased concentration. The patient has insomnia.   All other systems reviewed and are negative.    Observations/Objective: No SOB or distress  noted  Assessment and Plan: Jeanette Daniels comes in today with chief complaint of No chief complaint on file.   Diagnosis and orders addressed:  1. Hypertension associated with diabetes ([place]) - CMP14+EGFR; Future - CBC with Differential/Platelet; Future  2. Type 2 diabetes mellitus with other specified complication, without long-term current use of insulin ([facility]) - Bayer DCA Hb A1c Waived; Future - CMP14+EGFR; Future - CBC with Differential/Platelet; Future  3. Hyperlipidemia associated with type 2 diabetes mellitus ([facility]) - CMP14+EGFR; Future - CBC with Differential/Platelet; Future - Lipid panel; Future  4. Constipation, unspecified constipation type - CMP14+EGFR; Future - CBC with Differential/Platelet; Future  5. GAD (generalized anxiety disorder) - CMP14+EGFR; Future - CBC with Differential/Platelet; Future  6. Insomnia, unspecified type - CMP14+EGFR; Future - CBC with Differential/Platelet; Future - zolpidem (AMBIEN) 10 MG tablet; Take 1 tablet (10 mg total) by mouth at bedtime.  Dispense: 30 tablet; Refill: 5  7. Obesity (BMI 30-39.9) - CMP14+EGFR; Future - CBC with Differential/Platelet; Future  8. Vitamin D deficiency - CMP14+EGFR; Future - CBC with Differential/Platelet; Future   Labs pending Health Maintenance reviewed Diet and exercise encouraged  Follow up plan: 6 months       I discussed the assessment and treatment plan with the patient. The patient was provided an opportunity to ask questions and all were answered. The patient agreed with the plan and demonstrated an understanding of the instructions.   The patient was advised to call back or seek an in-person evaluation if the symptoms worsen or if the condition fails to improve as anticipated.  The above assessment and management plan was discussed with the patient. The patient verbalized understanding of and has agreed to the management plan. Patient is aware to call the clinic if symptoms  persist or worsen. Patient is aware when to return to the clinic for a follow-up visit. Patient educated on when it is appropriate to go to the emergency department.   Time call ended:  2:19 pm  I provided 19 minutes of non-face-to-face time during this encounter.    Evelina Dun, FNP

## 2019-11-09 ENCOUNTER — Other Ambulatory Visit: Payer: BC Managed Care – PPO

## 2019-11-09 ENCOUNTER — Other Ambulatory Visit: Payer: Self-pay

## 2019-11-09 DIAGNOSIS — E1169 Type 2 diabetes mellitus with other specified complication: Secondary | ICD-10-CM

## 2019-11-09 DIAGNOSIS — E669 Obesity, unspecified: Secondary | ICD-10-CM

## 2019-11-09 DIAGNOSIS — E559 Vitamin D deficiency, unspecified: Secondary | ICD-10-CM

## 2019-11-09 DIAGNOSIS — K5900 Constipation, unspecified: Secondary | ICD-10-CM

## 2019-11-09 DIAGNOSIS — F411 Generalized anxiety disorder: Secondary | ICD-10-CM

## 2019-11-09 DIAGNOSIS — I152 Hypertension secondary to endocrine disorders: Secondary | ICD-10-CM

## 2019-11-09 DIAGNOSIS — E1159 Type 2 diabetes mellitus with other circulatory complications: Secondary | ICD-10-CM

## 2019-11-09 DIAGNOSIS — G4700 Insomnia, unspecified: Secondary | ICD-10-CM

## 2019-11-09 LAB — BAYER DCA HB A1C WAIVED: HB A1C (BAYER DCA - WAIVED): 5.8 % (ref <7.0)

## 2019-11-10 LAB — CBC WITH DIFFERENTIAL/PLATELET
Basophils Absolute: 0.1 10*3/uL (ref 0.0–0.2)
Basos: 1 %
EOS (ABSOLUTE): 0.2 10*3/uL (ref 0.0–0.4)
Eos: 3 %
Hematocrit: 40.5 % (ref 34.0–46.6)
Hemoglobin: 13.7 g/dL (ref 11.1–15.9)
Immature Grans (Abs): 0 10*3/uL (ref 0.0–0.1)
Immature Granulocytes: 1 %
Lymphocytes Absolute: 1.9 10*3/uL (ref 0.7–3.1)
Lymphs: 24 %
MCH: 29.6 pg (ref 26.6–33.0)
MCHC: 33.8 g/dL (ref 31.5–35.7)
MCV: 88 fL (ref 79–97)
Monocytes Absolute: 0.6 10*3/uL (ref 0.1–0.9)
Monocytes: 7 %
Neutrophils Absolute: 5.3 10*3/uL (ref 1.4–7.0)
Neutrophils: 64 %
Platelets: 246 10*3/uL (ref 150–450)
RBC: 4.63 x10E6/uL (ref 3.77–5.28)
RDW: 12.6 % (ref 11.7–15.4)
WBC: 8.1 10*3/uL (ref 3.4–10.8)

## 2019-11-10 LAB — LIPID PANEL
Chol/HDL Ratio: 4.3 ratio (ref 0.0–4.4)
Cholesterol, Total: 183 mg/dL (ref 100–199)
HDL: 43 mg/dL (ref >39)
LDL Chol Calc (NIH): 110 mg/dL — ABNORMAL HIGH (ref 0–99)
Triglycerides: 172 mg/dL — ABNORMAL HIGH (ref 0–149)
VLDL Cholesterol Cal: 30 mg/dL (ref 5–40)

## 2019-11-10 LAB — CMP14+EGFR
ALT: 13 IU/L (ref 0–32)
AST: 17 IU/L (ref 0–40)
Albumin/Globulin Ratio: 1.6 (ref 1.2–2.2)
Albumin: 4.5 g/dL (ref 3.7–4.7)
Alkaline Phosphatase: 81 IU/L (ref 39–117)
BUN/Creatinine Ratio: 20 (ref 12–28)
BUN: 15 mg/dL (ref 8–27)
Bilirubin Total: 0.4 mg/dL (ref 0.0–1.2)
CO2: 22 mmol/L (ref 20–29)
Calcium: 10.4 mg/dL — ABNORMAL HIGH (ref 8.7–10.3)
Chloride: 101 mmol/L (ref 96–106)
Creatinine, Ser: 0.75 mg/dL (ref 0.57–1.00)
GFR calc Af Amer: 89 mL/min/{1.73_m2} (ref >59)
GFR calc non Af Amer: 77 mL/min/{1.73_m2} (ref >59)
Globulin, Total: 2.9 g/dL (ref 1.5–4.5)
Glucose: 96 mg/dL (ref 65–99)
Potassium: 4.7 mmol/L (ref 3.5–5.2)
Sodium: 138 mmol/L (ref 134–144)
Total Protein: 7.4 g/dL (ref 6.0–8.5)

## 2019-11-11 ENCOUNTER — Other Ambulatory Visit: Payer: Self-pay | Admitting: Family

## 2019-11-12 ENCOUNTER — Telehealth: Payer: Self-pay | Admitting: Family

## 2019-11-12 NOTE — Telephone Encounter (Signed)
Patient aware.

## 2019-11-16 ENCOUNTER — Other Ambulatory Visit: Payer: Self-pay | Admitting: Family

## 2019-11-22 ENCOUNTER — Other Ambulatory Visit: Payer: Self-pay | Admitting: Family

## 2019-11-30 ENCOUNTER — Other Ambulatory Visit: Payer: Self-pay | Admitting: Family

## 2019-12-30 ENCOUNTER — Other Ambulatory Visit: Payer: Self-pay | Admitting: Family

## 2020-01-03 ENCOUNTER — Other Ambulatory Visit: Payer: Self-pay | Admitting: Family

## 2020-01-03 DIAGNOSIS — F411 Generalized anxiety disorder: Secondary | ICD-10-CM

## 2020-01-04 NOTE — Telephone Encounter (Signed)
OV 10/27/19 rtc 6 mos

## 2020-01-28 IMAGING — DX DG KNEE 1-2V*L*
2 series · 2 of 2 positions shown · non-contrast
Comparison: None.

CLINICAL DATA: Left knee pain after fall.

EXAM:
LEFT KNEE - 1-2 VIEW

[knee ap]
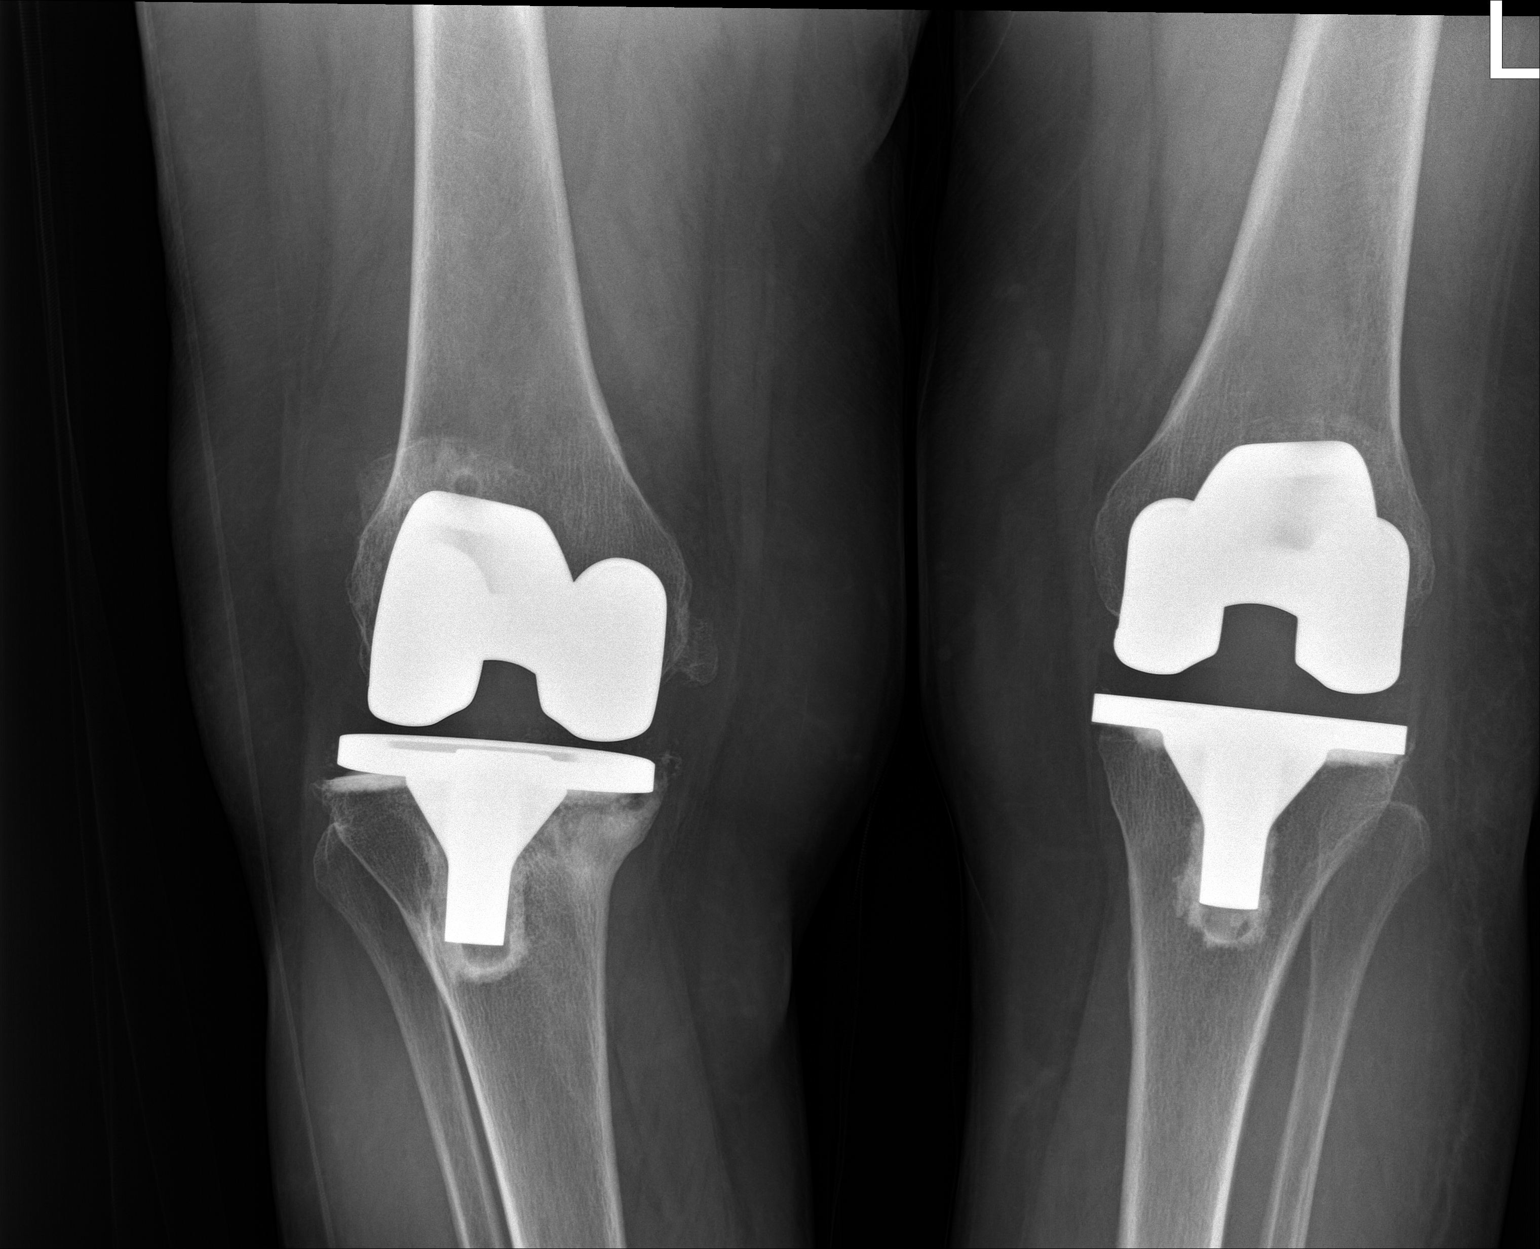

[knee lat]
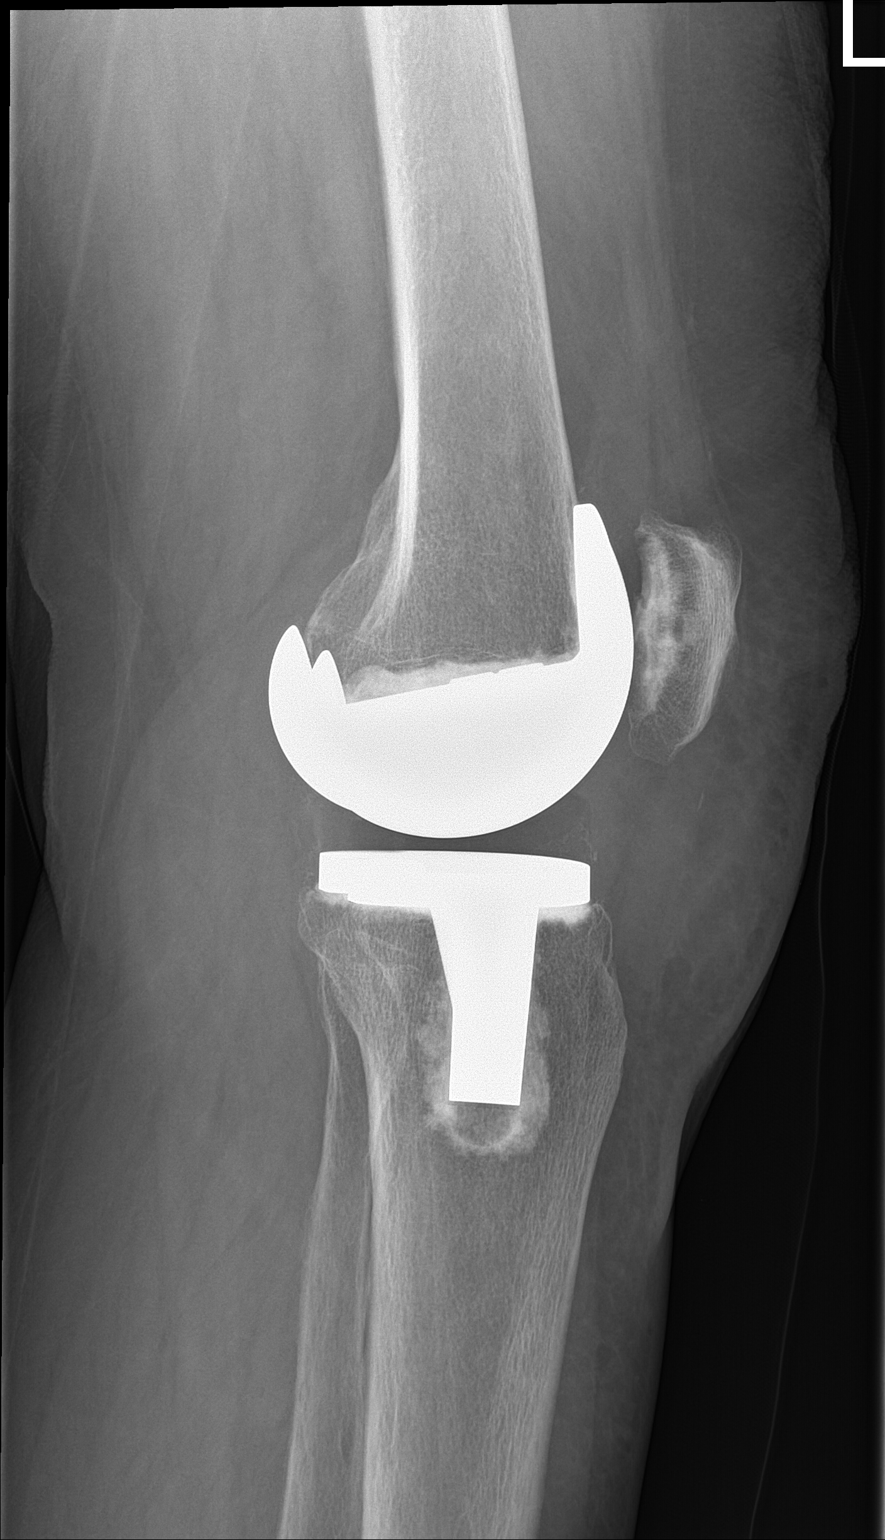

[2 of 2 positions shown; findings below may reference images not displayed]

FINDINGS: Prior left total knee arthroplasty. No evidence of hardware failure
or loosening. No acute fracture or dislocation. Small suprapatellar
joint effusion versus synovitis. Osteopenia. Soft tissues are
unremarkable.

Single frontal view of the right knee demonstrates prior total knee
arthroplasty with loosening of the tibial component and genu varus
deformity. There is increased sclerosis within the medial tibia,
likely stress related.
IMPRESSION: 1. Prior left total knee arthroplasty without evidence of hardware
complication or acute osseous abnormality.
2. Single view of the right knee demonstrates prior total knee
arthroplasty with loosening of the tibial component and genu varus
deformity.

## 2020-03-21 ENCOUNTER — Other Ambulatory Visit: Payer: Self-pay | Admitting: Family

## 2020-04-03 ENCOUNTER — Other Ambulatory Visit: Payer: Self-pay | Admitting: Family

## 2020-04-03 DIAGNOSIS — F411 Generalized anxiety disorder: Secondary | ICD-10-CM

## 2020-04-04 NOTE — Telephone Encounter (Signed)
appt scheduled with PCP 04/25/20

## 2020-04-25 ENCOUNTER — Encounter: Payer: Self-pay | Admitting: Family

## 2020-04-25 ENCOUNTER — Other Ambulatory Visit: Payer: Self-pay

## 2020-04-25 ENCOUNTER — Ambulatory Visit (INDEPENDENT_AMBULATORY_CARE_PROVIDER_SITE_OTHER): Payer: BC Managed Care – PPO | Admitting: Family

## 2020-04-25 VITALS — BP 128/70 | HR 63 | Temp 97.1°F | Ht 67.0 in | Wt 207.0 lb

## 2020-04-25 DIAGNOSIS — E669 Obesity, unspecified: Secondary | ICD-10-CM

## 2020-04-25 DIAGNOSIS — E1169 Type 2 diabetes mellitus with other specified complication: Secondary | ICD-10-CM | POA: Diagnosis not present

## 2020-04-25 DIAGNOSIS — Z79899 Other long term (current) drug therapy: Secondary | ICD-10-CM

## 2020-04-25 DIAGNOSIS — G4700 Insomnia, unspecified: Secondary | ICD-10-CM

## 2020-04-25 DIAGNOSIS — E785 Hyperlipidemia, unspecified: Secondary | ICD-10-CM

## 2020-04-25 DIAGNOSIS — I152 Hypertension secondary to endocrine disorders: Secondary | ICD-10-CM

## 2020-04-25 DIAGNOSIS — K5900 Constipation, unspecified: Secondary | ICD-10-CM

## 2020-04-25 DIAGNOSIS — E559 Vitamin D deficiency, unspecified: Secondary | ICD-10-CM

## 2020-04-25 DIAGNOSIS — E1159 Type 2 diabetes mellitus with other circulatory complications: Secondary | ICD-10-CM

## 2020-04-25 DIAGNOSIS — F411 Generalized anxiety disorder: Secondary | ICD-10-CM

## 2020-04-25 DIAGNOSIS — I10 Essential (primary) hypertension: Secondary | ICD-10-CM

## 2020-04-25 DIAGNOSIS — Z1159 Encounter for screening for other viral diseases: Secondary | ICD-10-CM

## 2020-04-25 LAB — BAYER DCA HB A1C WAIVED: HB A1C (BAYER DCA - WAIVED): 5.7 % (ref <7.0)

## 2020-04-25 MED ORDER — ATORVASTATIN CALCIUM 20 MG PO TABS: 20.0000 mg | ORAL_TABLET | Freq: Every day | ORAL | 0 refills | Status: DC

## 2020-04-25 MED ORDER — METOPROLOL SUCCINATE ER 100 MG PO TB24: 100.0000 mg | ORAL_TABLET | Freq: Two times a day (BID) | ORAL | 1 refills | Status: DC

## 2020-04-25 MED ORDER — LOSARTAN POTASSIUM 50 MG PO TABS: 100.0000 mg | ORAL_TABLET | Freq: Every day | ORAL | 1 refills | Status: DC

## 2020-04-25 MED ORDER — METFORMIN HCL 500 MG PO TABS: 500.0000 mg | ORAL_TABLET | Freq: Two times a day (BID) | ORAL | 0 refills | Status: DC

## 2020-04-25 MED ORDER — ZOLPIDEM TARTRATE 10 MG PO TABS: 10.0000 mg | ORAL_TABLET | Freq: Every day | ORAL | 5 refills | Status: DC

## 2020-04-25 MED ORDER — SERTRALINE HCL 100 MG PO TABS: 200.0000 mg | ORAL_TABLET | Freq: Every day | ORAL | 0 refills | Status: DC

## 2020-04-25 NOTE — Progress Notes (Signed)
Subjective:    Patient ID: Jeanette Daniels, female    DOB: Oct 18, 1940, 79 y.o.   MRN: [id number]  Chief Complaint  Patient presents with  . Medical Management of Chronic Issues    6 mth, taken amoxicillin for diverticlitits  . Diabetes  . Hypertension   Pt calls  the office today for chronic follow up. Pt reports she had COVID in 08/2019 and is doing well. She has had both of her COVID vaccines.   She has hx of left breast cancer in 2006 and is followed by Oncologists annually.  Diabetes She presents for her follow-up diabetic visit. She has type 2 diabetes mellitus. Her disease course has been stable. Hypoglycemia symptoms include nervousness/anxiousness. Pertinent negatives for diabetes include no blurred vision and no foot paresthesias. Symptoms are stable. Pertinent negatives for diabetic complications include no nephropathy or peripheral neuropathy. Risk factors for coronary artery disease include dyslipidemia, diabetes mellitus, hypertension, sedentary lifestyle and post-menopausal. Her weight is stable. She is following a generally healthy diet. Her overall blood glucose range is 110-130 mg/dl. An ACE inhibitor/angiotensin II receptor blocker is being taken. Eye exam is current.  Hypertension This is a chronic problem. The current episode started more than 1 year ago. The problem has been resolved since onset. The problem is controlled. Associated symptoms include anxiety and malaise/fatigue. Pertinent negatives include no blurred vision, peripheral edema or shortness of breath. Risk factors for coronary artery disease include dyslipidemia, diabetes mellitus and sedentary lifestyle. Past treatments include angiotensin blockers. The current treatment provides moderate improvement. There is no history of kidney disease or heart failure.  Hyperlipidemia This is a chronic problem. The current episode started more than 1 year ago. The problem is uncontrolled. Exacerbating diseases include  obesity. Pertinent negatives include no shortness of breath. Current antihyperlipidemic treatment includes statins. The current treatment provides moderate improvement of lipids. Risk factors for coronary artery disease include dyslipidemia, diabetes mellitus, hypertension, a sedentary lifestyle and post-menopausal.  Insomnia Primary symptoms: difficulty falling asleep, frequent awakening, malaise/fatigue.  The current episode started more than one year. The onset quality is gradual. The problem occurs intermittently. The problem has been waxing and waning since onset.  Anxiety Presents for follow-up visit. Symptoms include depressed mood, excessive worry, insomnia, irritability and nervous/anxious behavior. Patient reports no shortness of breath. Symptoms occur occasionally. The severity of symptoms is moderate.    Constipation This is a chronic problem.      Review of Systems  Constitutional: Positive for irritability and malaise/fatigue.  Eyes: Negative for blurred vision.  Respiratory: Negative for shortness of breath.   Gastrointestinal: Positive for constipation.  Psychiatric/Behavioral: The patient is nervous/anxious and has insomnia.   All other systems reviewed and are negative.      Objective:   Physical Exam Vitals reviewed.  Constitutional:      General: She is not in acute distress.    Appearance: She is well-developed.  HENT:     Head: Normocephalic and atraumatic.     Right Ear: Tympanic membrane normal.     Left Ear: Tympanic membrane normal.  Eyes:     Pupils: Pupils are equal, round, and reactive to light.  Neck:     Thyroid: No thyromegaly.  Cardiovascular:     Rate and Rhythm: Normal rate and regular rhythm.     Heart sounds: Normal heart sounds. No murmur heard.   Pulmonary:     Effort: Pulmonary effort is normal. No respiratory distress.     Breath sounds: Normal  breath sounds. No wheezing.  Abdominal:     General: Bowel sounds are normal. There is  no distension.     Palpations: Abdomen is soft.     Tenderness: There is no abdominal tenderness.  Musculoskeletal:        General: No tenderness. Normal range of motion.     Cervical back: Normal range of motion and neck supple.  Skin:    General: Skin is warm and dry.  Neurological:     Mental Status: She is alert and oriented to person, place, and time.     Cranial Nerves: No cranial nerve deficit.     Deep Tendon Reflexes: Reflexes are normal and symmetric.  Psychiatric:        Behavior: Behavior normal.        Thought Content: Thought content normal.        Judgment: Judgment normal.       BP 128/70   Pulse 63   Temp (!) 97.1 F (36.2 C) (Temporal)   Ht '5\' 7"'  (1.702 m)   Wt 207 lb (93.9 kg)   SpO2 95%   BMI 32.42 kg/m      Assessment & Plan:  Jeanette Daniels comes in today with chief complaint of Medical Management of Chronic Issues (6 mth, taken amoxicillin for diverticlitits), Diabetes, and Hypertension   Diagnosis and orders addressed:  1. Type 2 diabetes mellitus with other specified complication, without long-term current use of insulin (HCC) - Bayer DCA Hb A1c Waived - Microalbumin / creatinine urine ratio - metFORMIN (GLUCOPHAGE) 500 MG tablet; Take 1 tablet (500 mg total) by mouth 2 (two) times daily with a meal.  Dispense: 180 tablet; Refill: 0 - CMP14+EGFR - CBC with Differential/Platelet  2. GAD (generalized anxiety disorder) - sertraline (ZOLOFT) 100 MG tablet; Take 2 tablets (200 mg total) by mouth daily.  Dispense: 180 tablet; Refill: 0 - CMP14+EGFR - CBC with Differential/Platelet  3. Insomnia, unspecified type - zolpidem (AMBIEN) 10 MG tablet; Take 1 tablet (10 mg total) by mouth at bedtime.  Dispense: 30 tablet; Refill: 5 - CMP14+EGFR - CBC with Differential/Platelet  4. Hypertension associated with diabetes ([place]) - metoprolol succinate (TOPROL-XL) 100 MG 24 hr tablet; Take 1 tablet (100 mg total) by mouth 2 (two) times daily. Take with or  immediately following a meal.  Dispense: 180 tablet; Refill: 1 - losartan (COZAAR) 50 MG tablet; Take 2 tablets (100 mg total) by mouth daily.  Dispense: 180 tablet; Refill: 1 - CMP14+EGFR - CBC with Differential/Platelet  5. Hyperlipidemia associated with type 2 diabetes mellitus (HCC) - atorvastatin (LIPITOR) 20 MG tablet; Take 1 tablet (20 mg total) by mouth daily.  Dispense: 90 tablet; Refill: 0 - CMP14+EGFR - CBC with Differential/Platelet  6. Constipation, unspecified constipation type - CMP14+EGFR - CBC with Differential/Platelet  7. Obesity (BMI 30-39.9) - CMP14+EGFR - CBC with Differential/Platelet  8. Vitamin D deficiency - CMP14+EGFR - CBC with Differential/Platelet  9. Controlled substance agreement signed - ToxASSURE Select 13 (MW), Urine - CMP14+EGFR - CBC with Differential/Platelet  10. Need for hepatitis C screening test - CMP14+EGFR - CBC with Differential/Platelet - Hepatitis C antibody   Labs pending Health Maintenance reviewed Diet and exercise encouraged  Follow up plan: 6 months    Evelina Dun, FNP

## 2020-04-25 NOTE — Patient Instructions (Signed)
Health Maintenance After Age 78 After age 78, you are at a higher risk for certain long-term diseases and infections as well as injuries from falls. Falls are a major cause of broken bones and head injuries in people who are older than age 78. Getting regular preventive care can help to keep you healthy and well. Preventive care includes getting regular testing and making lifestyle changes as recommended by your health care provider. Talk with your health care provider about:  Which screenings and tests you should have. A screening is a test that checks for a disease when you have no symptoms.  A diet and exercise plan that is right for you. What should I know about screenings and tests to prevent falls? Screening and testing are the best ways to find a health problem early. Early diagnosis and treatment give you the best chance of managing medical conditions that are common after age 78. Certain conditions and lifestyle choices may make you more likely to have a fall. Your health care provider may recommend:  Regular vision checks. Poor vision and conditions such as cataracts can make you more likely to have a fall. If you wear glasses, make sure to get your prescription updated if your vision changes.  Medicine review. Work with your health care provider to regularly review all of the medicines you are taking, including over-the-counter medicines. Ask your health care provider about any side effects that may make you more likely to have a fall. Tell your health care provider if any medicines that you take make you feel dizzy or sleepy.  Osteoporosis screening. Osteoporosis is a condition that causes the bones to get weaker. This can make the bones weak and cause them to break more easily.  Blood pressure screening. Blood pressure changes and medicines to control blood pressure can make you feel dizzy.  Strength and balance checks. Your health care provider may recommend certain tests to check your  strength and balance while standing, walking, or changing positions.  Foot health exam. Foot pain and numbness, as well as not wearing proper footwear, can make you more likely to have a fall.  Depression screening. You may be more likely to have a fall if you have a fear of falling, feel emotionally low, or feel unable to do activities that you used to do.  Alcohol use screening. Using too much alcohol can affect your balance and may make you more likely to have a fall. What actions can I take to lower my risk of falls? General instructions  Talk with your health care provider about your risks for falling. Tell your health care provider if: ? You fall. Be sure to tell your health care provider about all falls, even ones that seem minor. ? You feel dizzy, sleepy, or off-balance.  Take over-the-counter and prescription medicines only as told by your health care provider. These include any supplements.  Eat a healthy diet and maintain a healthy weight. A healthy diet includes low-fat dairy products, low-fat (lean) meats, and fiber from whole grains, beans, and lots of fruits and vegetables. Home safety  Remove any tripping hazards, such as rugs, cords, and clutter.  Install safety equipment such as grab bars in bathrooms and safety rails on stairs.  Keep rooms and walkways well-lit. Activity   Follow a regular exercise program to stay fit. This will help you maintain your balance. Ask your health care provider what types of exercise are appropriate for you.  If you need a cane or   walker, use it as recommended by your health care provider.  Wear supportive shoes that have nonskid soles. Lifestyle  Do not drink alcohol if your health care provider tells you not to drink.  If you drink alcohol, limit how much you have: ? 0-1 drink a day for women. ? 0-2 drinks a day for men.  Be aware of how much alcohol is in your drink. In the [place] one drink equals one typical bottle of beer (12  oz), one-half glass of wine (5 oz), or one shot of hard liquor (1 oz).  Do not use any products that contain nicotine or tobacco, such as cigarettes and e-cigarettes. If you need help quitting, ask your health care provider. Summary  Having a healthy lifestyle and getting preventive care can help to protect your health and wellness after age 78.  Screening and testing are the best way to find a health problem early and help you avoid having a fall. Early diagnosis and treatment give you the best chance for managing medical conditions that are more common for people who are older than age 78.  Falls are a major cause of broken bones and head injuries in people who are older than age 78. Take precautions to prevent a fall at home.  Work with your health care provider to learn what changes you can make to improve your health and wellness and to prevent falls. This information is not intended to replace advice given to you by your health care provider. Make sure you discuss any questions you have with your health care provider. Document Revised: 12/24/2018 Document Reviewed: 07/16/2017 Elsevier Patient Education  2020 Elsevier Inc.  

## 2020-04-26 ENCOUNTER — Ambulatory Visit (INDEPENDENT_AMBULATORY_CARE_PROVIDER_SITE_OTHER): Payer: BC Managed Care – PPO | Admitting: *Deleted

## 2020-04-26 DIAGNOSIS — Z0000 Encounter for general adult medical examination without abnormal findings: Secondary | ICD-10-CM | POA: Diagnosis not present

## 2020-04-26 LAB — CMP14+EGFR
ALT: 13 IU/L (ref 0–32)
AST: 13 IU/L (ref 0–40)
Albumin/Globulin Ratio: 1.4 (ref 1.2–2.2)
Albumin: 4.2 g/dL (ref 3.7–4.7)
Alkaline Phosphatase: 85 IU/L (ref 48–121)
BUN/Creatinine Ratio: 17 (ref 12–28)
BUN: 13 mg/dL (ref 8–27)
Bilirubin Total: 0.5 mg/dL (ref 0.0–1.2)
CO2: 27 mmol/L (ref 20–29)
Calcium: 9.9 mg/dL (ref 8.7–10.3)
Chloride: 102 mmol/L (ref 96–106)
Creatinine, Ser: 0.75 mg/dL (ref 0.57–1.00)
GFR calc Af Amer: 88 mL/min/{1.73_m2} (ref >59)
GFR calc non Af Amer: 77 mL/min/{1.73_m2} (ref >59)
Globulin, Total: 3 g/dL (ref 1.5–4.5)
Glucose: 117 mg/dL — ABNORMAL HIGH (ref 65–99)
Potassium: 4.9 mmol/L (ref 3.5–5.2)
Sodium: 140 mmol/L (ref 134–144)
Total Protein: 7.2 g/dL (ref 6.0–8.5)

## 2020-04-26 LAB — CBC WITH DIFFERENTIAL/PLATELET
Basophils Absolute: 0 10*3/uL (ref 0.0–0.2)
Basos: 1 %
EOS (ABSOLUTE): 0.1 10*3/uL (ref 0.0–0.4)
Eos: 2 %
Hematocrit: 38.6 % (ref 34.0–46.6)
Hemoglobin: 13.3 g/dL (ref 11.1–15.9)
Immature Grans (Abs): 0 10*3/uL (ref 0.0–0.1)
Immature Granulocytes: 0 %
Lymphocytes Absolute: 1.5 10*3/uL (ref 0.7–3.1)
Lymphs: 22 %
MCH: 30.3 pg (ref 26.6–33.0)
MCHC: 34.5 g/dL (ref 31.5–35.7)
MCV: 88 fL (ref 79–97)
Monocytes Absolute: 0.4 10*3/uL (ref 0.1–0.9)
Monocytes: 6 %
Neutrophils Absolute: 4.9 10*3/uL (ref 1.4–7.0)
Neutrophils: 69 %
Platelets: 241 10*3/uL (ref 150–450)
RBC: 4.39 x10E6/uL (ref 3.77–5.28)
RDW: 12.8 % (ref 11.7–15.4)
WBC: 7 10*3/uL (ref 3.4–10.8)

## 2020-04-26 LAB — MICROALBUMIN / CREATININE URINE RATIO
Creatinine, Urine: 241.9 mg/dL
Microalb/Creat Ratio: 18 mg/g creat (ref 0–29)
Microalbumin, Urine: 42.6 ug/mL

## 2020-04-26 LAB — HEPATITIS C ANTIBODY: Hep C Virus Ab: <0.1 s/co ratio (ref 0.0–0.9)

## 2020-04-26 NOTE — Progress Notes (Signed)
MEDICARE ANNUAL WELLNESS VISIT  04/26/2020  Telephone Visit Disclaimer This Medicare AWV was conducted by telephone due to national recommendations for restrictions regarding the COVID-19 Pandemic (e.g. social distancing).  I verified, using two identifiers, that I am speaking with Jeanette Daniels or their authorized healthcare agent. I discussed the limitations, risks, security, and privacy concerns of performing an evaluation and management service by telephone and the potential availability of an in-person appointment in the future. The patient expressed understanding and agreed to proceed.   Subjective:  Jeanette Daniels is a 79 y.o. female patient of Hawks, Theador Hawthorne, FNP who had a Medicare Annual Wellness Visit today via telephone. 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 she has 2 children. she reports that she is socially active and does interact with friends/family regularly. she is minimally physically active and enjoys gardening, canning and reading.  Patient Care Team: Sharion Balloon, FNP as PCP - General (Nurse Practitioner)  Advanced Directives 04/26/2020 03/02/2019  Does Patient Have a Medical Advance Directive? Yes No  Type of Advance Directive [place] -  Does patient want to make changes to medical advance directive? No - Patient declined -  Copy of [place] in Chart? No - copy requested -  Would patient like information on creating a medical advance directive? - No - Patient declined    Hospital Utilization Over the Past 12 Months: # of hospitalizations or ER visits: 0 # of surgeries: 0  Review of Systems    Patient reports that her overall health is unchanged compared to last year.  History obtained from chart review and the patient  Patient Reported Readings (BP, Pulse, CBG, Weight, etc) none  Pain Assessment Pain : No/denies pain     Current Medications & Allergies  (verified) Allergies as of 04/26/2020      Reactions   Hydrochlorothiazide Other (See Comments)   Ace Inhibitors Cough   Augmentin [amoxicillin-pot Clavulanate] Diarrhea   Nausea   Celecoxib       Medication List       Accurate as of April 26, 2020 11:54 AM. If you have any questions, ask your nurse or doctor.        STOP taking these medications   Vitamin D (Ergocalciferol) 1.25 MG (50000 UNIT) Caps capsule Commonly known as: DRISDOL     TAKE these medications   acyclovir ointment 5 % Commonly known as: Zovirax Apply 1 application topically every 3 (three) hours.   amoxicillin 500 MG tablet Commonly known as: AMOXIL Take 500 mg by mouth 2 (two) times daily.   aspirin EC 81 MG tablet Take 1 tablet (81 mg total) by mouth daily.   atorvastatin 20 MG tablet Commonly known as: LIPITOR Take 1 tablet (20 mg total) by mouth daily.   docusate sodium 100 MG capsule Commonly known as: COLACE Take 100 mg by mouth daily.   losartan 50 MG tablet Commonly known as: COZAAR Take 2 tablets (100 mg total) by mouth daily.   metFORMIN 500 MG tablet Commonly known as: GLUCOPHAGE Take 1 tablet (500 mg total) by mouth 2 (two) times daily with a meal.   metoprolol succinate 100 MG 24 hr tablet Commonly known as: TOPROL-XL Take 1 tablet (100 mg total) by mouth 2 (two) times daily. Take with or immediately following a meal.   ONE TOUCH ULTRA TEST test strip Generic drug: glucose blood USE TO CHECK BLOOD SUGAR  UP TO 4 TIMES DAILY   sertraline 100 MG tablet Commonly known as: ZOLOFT Take 2 tablets (200 mg total) by mouth daily.   Vitamin D 50 MCG (2000 UT) Caps Take 1,000 Units by mouth 2 (two) times daily. gummie chewable   zolpidem 10 MG tablet Commonly known as: AMBIEN Take 1 tablet (10 mg total) by mouth at bedtime.       History (reviewed): Past Medical History:  Diagnosis Date  . Breast cancer ([place])    S/P chemotherapy, radiation, and surgery  . Diabetes  mellitus without complication ([place])   . Hyperlipidemia    x16 years  . Hypertension    x16 years   Past Surgical History:  Procedure Laterality Date  . MASTECTOMY     Left  . TOTAL KNEE ARTHROPLASTY     Right   Family History  Problem Relation Age of Onset  . Alzheimer's disease Father   . Diabetes Father   . Hypertension Sister   . Diabetes Brother   . Hypertension Brother   . Leukemia Son    Social History   Socioeconomic History  . Marital status: Married    Spouse name: Jori Moll  . Number of children: 2  . Years of education: 38  . Highest education level: High school graduate  Occupational History  . 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 Smoking status: Never Smoker  . Smokeless tobacco: Never Used  Vaping Use  . Vaping Use: Never used  Substance and Sexual Activity  . Alcohol use: No  . Drug use: No  . Sexual activity: Yes    Birth control/protection: Post-menopausal  Other Topics Concern  . Not on file  Social History Narrative   Married with 2 children   Social Determinants of Health   Financial Resource Strain: Low Risk   . Difficulty of Paying Living Expenses: Not hard at all  Food Insecurity: No Food Insecurity  . Worried About Charity fundraiser in the Last Year: Never true  . Ran Out of Food in the Last Year: Never true  Transportation Needs: No Transportation Needs  . Lack of Transportation (Medical): No  . Lack of Transportation (Non-Medical): No  Physical Activity: Inactive  . Days of Exercise per Week: 0 days  . Minutes of Exercise per Session: 0 min  Stress: No Stress Concern Present  . Feeling of Stress : Not at all  Social Connections: Socially Integrated  . Frequency of Communication with Friends and Family: More than three times a week  . Frequency of Social Gatherings with Friends and Family: More than three times a week  . Attends Religious Services: More than 4 times per year   . Active Member of Clubs or Organizations: Yes  . Attends Archivist Meetings: More than 4 times per year  . Marital Status: Married    Activities of Daily Living In your present state of health, do you have any difficulty performing the following activities: 04/26/2020  Hearing? N  Vision? N  Comment wears rx glasses-unsure of when last diabetic eye exam  Difficulty concentrating or making decisions? N  Walking or climbing stairs? N  Dressing or bathing? N  Doing errands, shopping? N  Preparing Food and eating ? N  Using the Toilet? N  In the past six months, have you accidently leaked urine? Y  Comment wears a pantyliner all the time  Do you have problems with loss of  bowel control? N  Managing your Medications? N  Managing your Finances? N  Housekeeping or managing your Housekeeping? N  Some recent data might be hidden    Patient Education/ Literacy How often do you need to have someone help you when you read instructions, pamphlets, or other written materials from your doctor or pharmacy?: 1 - Never What is the last grade level you completed in school?: 12th grade  Exercise Current Exercise Habits: The patient does not participate in regular exercise at present, Exercise limited by: None identified  Diet Patient reports consuming 3 meals a day and 1 snack(s) a day Patient reports that her primary diet is: Regular Patient reports that she does have regular access to food.   Depression Screen PHQ 2/9 Scores 04/26/2020 04/25/2020 04/22/2019 03/02/2019 08/04/2018 05/11/2018 11/20/2017  PHQ - 2 Score 0 0 0 0 0 0 0  PHQ- 9 Score - 0 - - - - -     Fall Risk Fall Risk  04/26/2020 04/25/2020 04/22/2019 03/02/2019 08/04/2018  Falls in the past year? 0 0 0 0 1  Number falls in past yr: - - - - 0  Injury with Fall? - - - - 0  Comment - - - - Bruising.     Objective:  Jeanette Daniels seemed alert and oriented and she participated appropriately during our telephone  visit.  Blood Pressure Weight BMI  BP Readings from Last 3 Encounters:  04/25/20 128/70  10/27/19 119/76  04/22/19 116/70   Wt Readings from Last 3 Encounters:  04/25/20 207 lb (93.9 kg)  04/22/19 207 lb (93.9 kg)  08/04/18 214 lb 9.6 oz (97.3 kg)   BMI Readings from Last 1 Encounters:  04/25/20 32.42 kg/m    *Unable to obtain current vital signs, weight, and BMI due to telephone visit type  Hearing/Vision  . Sanayah did not seem to have difficulty with hearing/understanding during the telephone conversation . Reports that she has had a formal eye exam by an eye care professional within the past year . Reports that she has not had a formal hearing evaluation within the past year *Unable to fully assess hearing and vision during telephone visit type  Cognitive Function: 6CIT Screen 04/26/2020 03/02/2019  What Year? 0 points 0 points  What month? 0 points 0 points  What time? 0 points 0 points  Count back from 20 0 points 0 points  Months in reverse 0 points 0 points  Repeat phrase 2 points 0 points  Total Score 2 0   (Normal:0-7, Significant for Dysfunction: >8)  Normal Cognitive Function Screening: Yes   Immunization & Health Maintenance Record Immunization History  Administered Date(s) Administered  . Fluad Quad(high Dose 65+) 06/07/2019  . Hepatitis B 07/28/1998, 08/28/1998, 01/29/1999  . Influenza Whole 07/23/2012  . Influenza, High Dose Seasonal PF 07/11/2017, 06/10/2018  . Influenza,inj,Quad PF,6+ Mos 06/23/2013, 07/11/2014, 07/11/2015, 07/10/2016  . Moderna SARS-COVID-2 Vaccination 10/07/2019, 11/07/2019  . Pneumococcal Conjugate-13 05/24/2014  . Pneumococcal Polysaccharide-23 06/16/2010  . Tdap 02/14/2009, 03/04/2011  . Zoster 09/16/2005    Health Maintenance  Topic Date Due  . OPHTHALMOLOGY EXAM  04/11/2017  . INFLUENZA VACCINE  07/26/2020 (Originally 04/16/2020)  . HEMOGLOBIN A1C  10/26/2020  . TETANUS/TDAP  03/03/2021  . FOOT EXAM  04/25/2021  . DEXA  SCAN  Completed  . COVID-19 Vaccine  Completed  . Hepatitis C Screening  Completed  . PNA vac Low Risk Adult  Completed       Assessment  This is  a routine wellness examination for Jeanette Daniels.  Health Maintenance: Due or Overdue Health Maintenance Due  Topic Date Due  . OPHTHALMOLOGY EXAM  04/11/2017    Jeanette Daniels does not need a referral for Community Assistance: Care Management:   no Social Work:    no Prescription Assistance:  no Nutrition/Diabetes Education:  no   Plan:  Personalized Goals Goals Addressed            This Visit's Progress   . Prevent falls        Personalized Health Maintenance & Screening Recommendations  Advanced directives: has an advanced directive - a copy HAS NOT been provided.  Lung Cancer Screening Recommended: no (Low Dose CT Chest recommended if Age 54-80 years, 30 pack-year currently smoking OR have quit w/in past 15 years) Hepatitis C Screening recommended: no HIV Screening recommended: no  Advanced Directives: Written information was not prepared per patient's request.  Referrals & Orders No orders of the defined types were placed in this encounter.   Follow-up Plan . Follow-up with Sharion Balloon, FNP as planned . Bring a copy of your Advanced Directive in for our records   I have personally reviewed and noted the following in the patient's chart:   . Medical and social history . Use of alcohol, tobacco or illicit drugs  . Current medications and supplements . Functional ability and status . Nutritional status . Physical activity . Advanced directives . List of other physicians . Hospitalizations, surgeries, and ER visits in previous 12 months . Vitals . Screenings to include cognitive, depression, and falls . Referrals and appointments  In addition, I have reviewed and discussed with Jeanette Daniels certain preventive protocols, quality metrics, and best practice recommendations. A written personalized care plan  for preventive services as well as general preventive health recommendations is available and can be mailed to the patient at her request.      Milas Hock, LPN  11/09/4973

## 2020-04-26 NOTE — Patient Instructions (Signed)
Preventive Care 38 Years and Older, Female Preventive care refers to lifestyle choices and visits with your health care provider that can promote health and wellness. This includes:  A yearly physical exam. This is also called an annual well check.  Regular dental and eye exams.  Immunizations.  Screening for certain conditions.  Healthy lifestyle choices, such as diet and exercise. What can I expect for my preventive care visit? Physical exam Your health care provider will check:  Height and weight. These may be used to calculate body mass index (BMI), which is a measurement that tells if you are at a healthy weight.  Heart rate and blood pressure.  Your skin for abnormal spots. Counseling Your health care provider may ask you questions about:  Alcohol, tobacco, and drug use.  Emotional well-being.  Home and relationship well-being.  Sexual activity.  Eating habits.  History of falls.  Memory and ability to understand (cognition).  Work and work Statistician.  Pregnancy and menstrual history. What immunizations do I need?  Influenza (flu) vaccine  This is recommended every year. Tetanus, diphtheria, and pertussis (Tdap) vaccine  You may need a Td booster every 10 years. Varicella (chickenpox) vaccine  You may need this vaccine if you have not already been vaccinated. Zoster (shingles) vaccine  You may need this after age 33. Pneumococcal conjugate (PCV13) vaccine  One dose is recommended after age 33. Pneumococcal polysaccharide (PPSV23) vaccine  One dose is recommended after age 72. Measles, mumps, and rubella (MMR) vaccine  You may need at least one dose of MMR if you were born in 1957 or later. You may also need a second dose. Meningococcal conjugate (MenACWY) vaccine  You may need this if you have certain conditions. Hepatitis A vaccine  You may need this if you have certain conditions or if you travel or work in places where you may be exposed  to hepatitis A. Hepatitis B vaccine  You may need this if you have certain conditions or if you travel or work in places where you may be exposed to hepatitis B. Haemophilus influenzae type b (Hib) vaccine  You may need this if you have certain conditions. You may receive vaccines as individual doses or as more than one vaccine together in one shot (combination vaccines). Talk with your health care provider about the risks and benefits of combination vaccines. What tests do I need? Blood tests  Lipid and cholesterol levels. These may be checked every 5 years, or more frequently depending on your overall health.  Hepatitis C test.  Hepatitis B test. Screening  Lung cancer screening. You may have this screening every year starting at age 39 if you have a 30-pack-year history of smoking and currently smoke or have quit within the past 15 years.  Colorectal cancer screening. All adults should have this screening starting at age 36 and continuing until age 15. Your health care provider may recommend screening at age 23 if you are at increased risk. You will have tests every 1-10 years, depending on your results and the type of screening test.  Diabetes screening. This is done by checking your blood sugar (glucose) after you have not eaten for a while (fasting). You may have this done every 1-3 years.  Mammogram. This may be done every 1-2 years. Talk with your health care provider about how often you should have regular mammograms.  BRCA-related cancer screening. This may be done if you have a family history of breast, ovarian, tubal, or peritoneal cancers.  Other tests  Sexually transmitted disease (STD) testing.  Bone density scan. This is done to screen for osteoporosis. You may have this done starting at age 44. Follow these instructions at home: Eating and drinking  Eat a diet that includes fresh fruits and vegetables, whole grains, lean protein, and low-fat dairy products. Limit  your intake of foods with high amounts of sugar, saturated fats, and salt.  Take vitamin and mineral supplements as recommended by your health care provider.  Do not drink alcohol if your health care provider tells you not to drink.  If you drink alcohol: ? Limit how much you have to 0-1 drink a day. ? Be aware of how much alcohol is in your drink. In the [place] one drink equals one 12 oz bottle of beer (355 mL), one 5 oz glass of wine (148 mL), or one 1 oz glass of hard liquor (44 mL). Lifestyle  Take daily care of your teeth and gums.  Stay active. Exercise for at least 30 minutes on 5 or more days each week.  Do not use any products that contain nicotine or tobacco, such as cigarettes, e-cigarettes, and chewing tobacco. If you need help quitting, ask your health care provider.  If you are sexually active, practice safe sex. Use a condom or other form of protection in order to prevent STIs (sexually transmitted infections).  Talk with your health care provider about taking a low-dose aspirin or statin. What's next?  Go to your health care provider once a year for a well check visit.  Ask your health care provider how often you should have your eyes and teeth checked.  Stay up to date on all vaccines. This information is not intended to replace advice given to you by your health care provider. Make sure you discuss any questions you have with your health care provider. Document Revised: 08/27/2018 Document Reviewed: 08/27/2018 Elsevier Patient Education  2020 [employer].

## 2020-04-30 LAB — TOXASSURE SELECT 13 (MW), URINE

## 2020-06-15 ENCOUNTER — Other Ambulatory Visit: Payer: Self-pay | Admitting: Family

## 2020-06-15 DIAGNOSIS — E1169 Type 2 diabetes mellitus with other specified complication: Secondary | ICD-10-CM

## 2020-06-23 ENCOUNTER — Ambulatory Visit (INDEPENDENT_AMBULATORY_CARE_PROVIDER_SITE_OTHER): Payer: BC Managed Care – PPO

## 2020-06-23 ENCOUNTER — Other Ambulatory Visit: Payer: Self-pay

## 2020-06-23 DIAGNOSIS — Z23 Encounter for immunization: Secondary | ICD-10-CM

## 2020-09-14 ENCOUNTER — Other Ambulatory Visit: Payer: Self-pay | Admitting: Family

## 2020-09-14 DIAGNOSIS — E785 Hyperlipidemia, unspecified: Secondary | ICD-10-CM

## 2020-09-14 DIAGNOSIS — E1169 Type 2 diabetes mellitus with other specified complication: Secondary | ICD-10-CM

## 2020-09-15 ENCOUNTER — Other Ambulatory Visit: Payer: Self-pay | Admitting: Family

## 2020-09-20 ENCOUNTER — Other Ambulatory Visit: Payer: Self-pay | Admitting: Family

## 2020-09-20 DIAGNOSIS — E1169 Type 2 diabetes mellitus with other specified complication: Secondary | ICD-10-CM

## 2020-09-26 ENCOUNTER — Other Ambulatory Visit: Payer: Self-pay | Admitting: Family

## 2020-09-26 DIAGNOSIS — F411 Generalized anxiety disorder: Secondary | ICD-10-CM

## 2020-10-26 ENCOUNTER — Encounter: Payer: Self-pay | Admitting: Family

## 2020-10-26 ENCOUNTER — Ambulatory Visit: Payer: BC Managed Care – PPO | Admitting: Family

## 2020-10-26 ENCOUNTER — Other Ambulatory Visit: Payer: Self-pay

## 2020-10-26 VITALS — BP 125/80 | HR 63 | Temp 97.5°F | Ht 67.0 in | Wt 212.8 lb

## 2020-10-26 DIAGNOSIS — N393 Stress incontinence (female) (male): Secondary | ICD-10-CM

## 2020-10-26 DIAGNOSIS — Z79899 Other long term (current) drug therapy: Secondary | ICD-10-CM

## 2020-10-26 DIAGNOSIS — K5900 Constipation, unspecified: Secondary | ICD-10-CM

## 2020-10-26 DIAGNOSIS — E559 Vitamin D deficiency, unspecified: Secondary | ICD-10-CM

## 2020-10-26 DIAGNOSIS — E1159 Type 2 diabetes mellitus with other circulatory complications: Secondary | ICD-10-CM | POA: Diagnosis not present

## 2020-10-26 DIAGNOSIS — G4700 Insomnia, unspecified: Secondary | ICD-10-CM | POA: Diagnosis not present

## 2020-10-26 DIAGNOSIS — E1169 Type 2 diabetes mellitus with other specified complication: Secondary | ICD-10-CM | POA: Diagnosis not present

## 2020-10-26 DIAGNOSIS — E669 Obesity, unspecified: Secondary | ICD-10-CM

## 2020-10-26 DIAGNOSIS — F411 Generalized anxiety disorder: Secondary | ICD-10-CM | POA: Diagnosis not present

## 2020-10-26 DIAGNOSIS — N3281 Overactive bladder: Secondary | ICD-10-CM

## 2020-10-26 DIAGNOSIS — E785 Hyperlipidemia, unspecified: Secondary | ICD-10-CM

## 2020-10-26 DIAGNOSIS — I152 Hypertension secondary to endocrine disorders: Secondary | ICD-10-CM

## 2020-10-26 LAB — BAYER DCA HB A1C WAIVED: HB A1C (BAYER DCA - WAIVED): 5.7 % (ref <7.0)

## 2020-10-26 MED ORDER — MIRABEGRON ER 25 MG PO TB24: 25.0000 mg | ORAL_TABLET | Freq: Every day | ORAL | 1 refills | Status: DC

## 2020-10-26 MED ORDER — ZOLPIDEM TARTRATE 10 MG PO TABS: 10.0000 mg | ORAL_TABLET | Freq: Every day | ORAL | 5 refills | Status: DC

## 2020-10-26 NOTE — Progress Notes (Signed)
Subjective:    Patient ID: Jeanette Daniels, female    DOB: 1941/01/31, 80 y.o.   MRN: [id number]  Chief Complaint  Patient presents with  . Hypertension  . Diabetes   [name]  She has hx of left breast cancer in 2006 and is followed by Oncologists annually.  Hypertension This is a chronic problem. The current episode started more than 1 year ago. The problem has been resolved since onset. The problem is controlled. Associated symptoms include anxiety. Pertinent negatives include no blurred vision, malaise/fatigue, peripheral edema or shortness of breath. Risk factors for coronary artery disease include dyslipidemia and obesity. The current treatment provides moderate improvement. There is no history of CVA or heart failure.  Diabetes She presents for her follow-up diabetic visit. She has type 2 diabetes mellitus. Pertinent negatives for hypoglycemia include no nervousness/anxiousness. Pertinent negatives for diabetes include no blurred vision and no foot paresthesias. Symptoms are stable. Pertinent negatives for diabetic complications include no CVA, heart disease or peripheral neuropathy. Risk factors for coronary artery disease include dyslipidemia, diabetes mellitus, hypertension and sedentary lifestyle. She is following a generally healthy diet. Her overall blood glucose range is 110-130 mg/dl. An ACE inhibitor/angiotensin II receptor blocker is being taken. Eye exam is current.  Hyperlipidemia This is a chronic problem. The current episode started more than 1 year ago. The problem is controlled. Exacerbating diseases include obesity. Pertinent negatives include no shortness of breath. Current antihyperlipidemic treatment includes statins. The current treatment provides moderate improvement of lipids. Risk factors for coronary artery disease include diabetes mellitus, hypertension, a sedentary lifestyle and post-menopausal.  Insomnia Primary symptoms:  difficulty falling asleep, no malaise/fatigue.  The current episode started more than one year. The onset quality is gradual. The problem occurs intermittently. The problem has been waxing and waning since onset.  Anxiety Presents for follow-up visit. Symptoms include insomnia. Patient reports no depressed mood, irritability, nervous/anxious behavior or shortness of breath.    Constipation This is a chronic problem. The current episode started more than 1 year ago. The problem has been resolved since onset. She has tried diet changes and stool softeners for the symptoms. The treatment provided moderate relief.      Review of Systems  Constitutional: Negative for irritability and malaise/fatigue.  Eyes: Negative for blurred vision.  Respiratory: Negative for shortness of breath.   Gastrointestinal: Positive for constipation.  Psychiatric/Behavioral: The patient has insomnia. The patient is not nervous/anxious.   All other systems reviewed and are negative.      Objective:   Physical Exam Vitals reviewed.  Constitutional:      General: She is not in acute distress.    Appearance: She is well-developed and well-nourished.  HENT:     Head: Normocephalic and atraumatic.     Right Ear: Tympanic membrane normal.     Left Ear: Tympanic membrane normal.     Mouth/Throat:     Mouth: Oropharynx is clear and moist.  Eyes:     Pupils: Pupils are equal, round, and reactive to light.  Neck:     Thyroid: No thyromegaly.  Cardiovascular:     Rate and Rhythm: Normal rate and regular rhythm.     Pulses: Intact distal pulses.     Heart sounds: Normal heart sounds. No murmur heard.   Pulmonary:     Effort: Pulmonary effort is normal. No respiratory distress.     Breath sounds: Normal breath sounds. No wheezing.  Abdominal:  General: Bowel sounds are normal. There is no distension.     Palpations: Abdomen is soft.     Tenderness: There is no abdominal tenderness.  Musculoskeletal:         General: No tenderness or edema. Normal range of motion.     Cervical back: Normal range of motion and neck supple.  Skin:    General: Skin is warm and dry.  Neurological:     Mental Status: She is alert and oriented to person, place, and time.     Cranial Nerves: No cranial nerve deficit.     Deep Tendon Reflexes: Reflexes are normal and symmetric.  Psychiatric:        Mood and Affect: Mood and affect normal.        Behavior: Behavior normal.        Thought Content: Thought content normal.        Judgment: Judgment normal.       BP 125/80   Pulse 63   Temp (!) 97.5 F (36.4 C) (Temporal)   Ht '5\' 7"'  (1.702 m)   Wt 212 lb 12.8 oz (96.5 kg)   BMI 33.33 kg/m      Assessment & Plan:  Jeanette Daniels comes in today with chief complaint of Hypertension and Diabetes   Diagnosis and orders addressed:  1. Insomnia, unspecified type - zolpidem (AMBIEN) 10 MG tablet; Take 1 tablet (10 mg total) by mouth at bedtime.  Dispense: 30 tablet; Refill: 5 - CMP14+EGFR - CBC with Differential/Platelet  2. Hypertension associated with diabetes ([facility]) - CMP14+EGFR - CBC with Differential/Platelet  3. Hyperlipidemia associated with type 2 diabetes mellitus ([facility]) - CMP14+EGFR - CBC with Differential/Platelet - Lipid panel  4. Type 2 diabetes mellitus with other specified complication, without long-term current use of insulin ([facility]) - CMP14+EGFR - CBC with Differential/Platelet - Bayer DCA Hb A1c Waived  5. GAD (generalized anxiety disorder) - CMP14+EGFR - CBC with Differential/Platelet  6. Controlled substance agreement signed - CMP14+EGFR - CBC with Differential/Platelet  7. Constipation, unspecified constipation type - CMP14+EGFR - CBC with Differential/Platelet  8. Obesity (BMI 30-39.9) - CMP14+EGFR - CBC with Differential/Platelet  9. Vitamin D deficiency - CMP14+EGFR - CBC with Differential/Platelet  10. Overactive bladder Will start myrbetriq 25 mg today Limit  caffeine  - mirabegron ER (MYRBETRIQ) 25 MG TB24 tablet; Take 1 tablet (25 mg total) by mouth daily.  Dispense: 90 tablet; Refill: 1  11. Stress incontinence -Kegel exercises discussed  Labs pending Health Maintenance reviewed Diet and exercise encouraged  Follow up plan: 6 months    Evelina Dun, FNP

## 2020-10-26 NOTE — Patient Instructions (Signed)
Overactive Bladder, Adult  Overactive bladder is a condition in which a person has a sudden and frequent need to urinate. A person might also leak urine if he or she cannot get to the bathroom fast enough (urinary incontinence). Sometimes, symptoms can interfere with work or social activities. What are the causes? Overactive bladder is associated with poor nerve signals between your bladder and your brain. Your bladder may get the signal to empty before it is full. You may also have very sensitive muscles that make your bladder squeeze too soon. This condition may also be caused by other factors, such as:  Medical conditions: ? Urinary tract infection. ? Infection of nearby tissues. ? Prostate enlargement. ? Bladder stones, inflammation, or tumors. ? Diabetes. ? Muscle or nerve weakness, especially from these conditions:  A spinal cord injury.  Stroke.  Multiple sclerosis.  Parkinson's disease.  Other causes: ? Surgery on the uterus or urethra. ? Drinking too much caffeine or alcohol. ? Certain medicines, especially those that eliminate extra fluid in the body (diuretics). ? Constipation. What increases the risk? You may be at greater risk for overactive bladder if you:  Are an older adult.  Smoke.  Are going through menopause.  Have prostate problems.  Have a neurological disease, such as stroke, dementia, Parkinson's disease, or multiple sclerosis (MS).  Eat or drink alcohol, spicy food, caffeine, and other things that irritate the bladder.  Are overweight or obese. What are the signs or symptoms? Symptoms of this condition include a sudden, strong urge to urinate. Other symptoms include:  Leaking urine.  Urinating 8 or more times a day.  Waking up to urinate 2 or more times overnight. How is this diagnosed? This condition may be diagnosed based on:  Your symptoms and medical history.  A physical exam.  Blood or urine tests to check for possible causes,  such as infection. You may also need to see a health care provider who specializes in urinary tract problems. This is called a urologist. How is this treated? Treatment for overactive bladder depends on the cause of your condition and whether it is mild or severe. Treatment may include:  Bladder training, such as: ? Learning to control the urge to urinate by following a schedule to urinate at regular intervals. ? Doing Kegel exercises to strengthen the pelvic floor muscles that support your bladder.  Special devices, such as: ? Biofeedback. This uses sensors to help you become aware of your body's signals. ? Electrical stimulation. This uses electrodes placed inside the body (implanted) or outside the body. These electrodes send gentle pulses of electricity to strengthen the nerves or muscles that control the bladder. ? Women may use a plastic device, called a pessary, that fits into the vagina and supports the bladder.  Medicines, such as: ? Antibiotics to treat bladder infection. ? Antispasmodics to stop the bladder from releasing urine at the wrong time. ? Tricyclic antidepressants to relax bladder muscles. ? Injections of botulinum toxin type A directly into the bladder tissue to relax bladder muscles.  Surgery, such as: ? A device may be implanted to help manage the nerve signals that control urination. ? An electrode may be implanted to stimulate electrical signals in the bladder. ? A procedure may be done to change the shape of the bladder. This is done only in very severe cases. Follow these instructions at home: Eating and drinking  Make diet or lifestyle changes recommended by your health care provider. These may include: ? Drinking fluids   throughout the day and not only with meals. ? Cutting down on caffeine or alcohol. ? Eating a healthy and balanced diet to prevent constipation. This may include:  Choosing foods that are high in fiber, such as beans, whole grains, and  fresh fruits and vegetables.  Limiting foods that are high in fat and processed sugars, such as fried and sweet foods.   Lifestyle  Lose weight if needed.  Do not use any products that contain nicotine or tobacco. These include cigarettes, chewing tobacco, and vaping devices, such as e-cigarettes. If you need help quitting, ask your health care provider.   General instructions  Take over-the-counter and prescription medicines only as told by your health care provider.  If you were prescribed an antibiotic medicine, take it as told by your health care provider. Do not stop taking the antibiotic even if you start to feel better.  Use any implants or pessary as told by your health care provider.  If needed, wear pads to absorb urine leakage.  Keep a log to track how much and when you drink, and when you need to urinate. This will help your health care provider monitor your condition.  Keep all follow-up visits. This is important. Contact a health care provider if:  You have a fever or chills.  Your symptoms do not get better with treatment.  Your pain and discomfort get worse.  You have more frequent urges to urinate. Get help right away if:  You are not able to control your bladder. Summary  Overactive bladder refers to a condition in which a person has a sudden and frequent need to urinate.  Several conditions may lead to an overactive bladder.  Treatment for overactive bladder depends on the cause and severity of your condition.  Making lifestyle changes, doing Kegel exercises, keeping a log, and taking medicines can help with this condition. This information is not intended to replace advice given to you by your health care provider. Make sure you discuss any questions you have with your health care provider. Document Revised: 05/22/2020 Document Reviewed: 05/22/2020 Elsevier Patient Education  2021 [employer]  

## 2020-10-27 LAB — CMP14+EGFR
ALT: 12 IU/L (ref 0–32)
AST: 14 IU/L (ref 0–40)
Albumin/Globulin Ratio: 1.5 (ref 1.2–2.2)
Albumin: 4.2 g/dL (ref 3.7–4.7)
Alkaline Phosphatase: 78 IU/L (ref 44–121)
BUN/Creatinine Ratio: 23 (ref 12–28)
BUN: 15 mg/dL (ref 8–27)
Bilirubin Total: 0.4 mg/dL (ref 0.0–1.2)
CO2: 25 mmol/L (ref 20–29)
Calcium: 9.7 mg/dL (ref 8.7–10.3)
Chloride: 103 mmol/L (ref 96–106)
Creatinine, Ser: 0.66 mg/dL (ref 0.57–1.00)
GFR calc Af Amer: 98 mL/min/{1.73_m2} (ref >59)
GFR calc non Af Amer: 85 mL/min/{1.73_m2} (ref >59)
Globulin, Total: 2.8 g/dL (ref 1.5–4.5)
Glucose: 96 mg/dL (ref 65–99)
Potassium: 5.3 mmol/L — ABNORMAL HIGH (ref 3.5–5.2)
Sodium: 141 mmol/L (ref 134–144)
Total Protein: 7 g/dL (ref 6.0–8.5)

## 2020-10-27 LAB — CBC WITH DIFFERENTIAL/PLATELET
Basophils Absolute: 0 10*3/uL (ref 0.0–0.2)
Basos: 1 %
EOS (ABSOLUTE): 0.2 10*3/uL (ref 0.0–0.4)
Eos: 2 %
Hematocrit: 40.9 % (ref 34.0–46.6)
Hemoglobin: 13.4 g/dL (ref 11.1–15.9)
Immature Grans (Abs): 0 10*3/uL (ref 0.0–0.1)
Immature Granulocytes: 0 %
Lymphocytes Absolute: 1.8 10*3/uL (ref 0.7–3.1)
Lymphs: 23 %
MCH: 28.7 pg (ref 26.6–33.0)
MCHC: 32.8 g/dL (ref 31.5–35.7)
MCV: 88 fL (ref 79–97)
Monocytes Absolute: 0.6 10*3/uL (ref 0.1–0.9)
Monocytes: 7 %
Neutrophils Absolute: 5.3 10*3/uL (ref 1.4–7.0)
Neutrophils: 67 %
Platelets: 246 10*3/uL (ref 150–450)
RBC: 4.67 x10E6/uL (ref 3.77–5.28)
RDW: 12.4 % (ref 11.7–15.4)
WBC: 7.8 10*3/uL (ref 3.4–10.8)

## 2020-10-27 LAB — LIPID PANEL
Chol/HDL Ratio: 3.2 ratio (ref 0.0–4.4)
Cholesterol, Total: 182 mg/dL (ref 100–199)
HDL: 57 mg/dL (ref >39)
LDL Chol Calc (NIH): 106 mg/dL — ABNORMAL HIGH (ref 0–99)
Triglycerides: 107 mg/dL (ref 0–149)
VLDL Cholesterol Cal: 19 mg/dL (ref 5–40)

## 2020-10-31 ENCOUNTER — Other Ambulatory Visit: Payer: Self-pay | Admitting: Family

## 2020-11-10 ENCOUNTER — Other Ambulatory Visit: Payer: Self-pay | Admitting: Family

## 2020-11-10 DIAGNOSIS — I152 Hypertension secondary to endocrine disorders: Secondary | ICD-10-CM

## 2020-11-10 DIAGNOSIS — E1159 Type 2 diabetes mellitus with other circulatory complications: Secondary | ICD-10-CM

## 2020-11-13 ENCOUNTER — Telehealth: Payer: Self-pay | Admitting: Family

## 2020-11-13 NOTE — Telephone Encounter (Signed)
Spoke with christy she is still wanting patient to stay off unless Bs run over 190 to call and let us know patient aware and verbalized understanding

## 2020-11-13 NOTE — Telephone Encounter (Signed)
Pt has questions about a medication that Christy prescribed to her. Would like to speak to Christy's nurse

## 2020-11-28 ENCOUNTER — Telehealth: Payer: Self-pay

## 2020-11-28 ENCOUNTER — Ambulatory Visit (INDEPENDENT_AMBULATORY_CARE_PROVIDER_SITE_OTHER): Payer: BC Managed Care – PPO | Admitting: Family Medicine

## 2020-11-28 DIAGNOSIS — Z20822 Contact with and (suspected) exposure to covid-19: Secondary | ICD-10-CM

## 2020-11-28 DIAGNOSIS — R6889 Other general symptoms and signs: Secondary | ICD-10-CM

## 2020-11-28 LAB — VERITOR FLU A/B WAIVED
Influenza A: NEGATIVE
Influenza B: NEGATIVE

## 2020-11-28 NOTE — Patient Instructions (Signed)
You had labs performed today.  You will be contacted with the results of the labs once they are available, usually in the next 3 business days for routine lab work.  If you have an active my chart account, they will be released to your MyChart.  If you prefer to have these labs released to you via telephone, please let us know.  If you had a pap smear or biopsy performed, expect to be contacted in about 7-10 days.  It appears that you have a viral upper respiratory infection (cold).  Cold symptoms can last up to 2 weeks.  I recommend that you only use cold medications that are safe in high blood pressure like Coricidin (generic is fine).  Other cold medications can increase your blood pressure.    - Get plenty of rest and drink plenty of fluids. - Try to breathe moist air. Use a cold mist humidifier. - Consume warm fluids (soup or tea) to provide relief for a stuffy nose and to loosen phlegm. - For nasal stuffiness, try saline nasal spray or a Neti Pot.  Afrin nasal spray can also be used but this product should not be used longer than 3 days or it will cause rebound nasal stuffiness (worsening nasal congestion). - For sore throat pain relief: use chloraseptic spray, suck on throat lozenges, hard candy or popsicles; gargle with warm salt water (1/4 tsp. salt per 8 oz. of water); and eat soft, bland foods. - Eat a well-balanced diet. If you cannot, ensure you are getting enough nutrients by taking a daily multivitamin. - Avoid dairy products, as they can thicken phlegm. - Avoid alcohol, as it impairs your body's immune system.  CONTACT YOUR DOCTOR IF YOU EXPERIENCE ANY OF THE FOLLOWING: - High fever - Ear pain - Sinus-type headache - Unusually severe cold symptoms - Cough that gets worse while other cold symptoms improve - Flare up of any chronic lung problem, such as asthma - Your symptoms persist longer than 2 weeks

## 2020-11-28 NOTE — Progress Notes (Signed)
Telephone visit  Subjective: CC: Sinusitis PCP: Sharion Balloon, FNP PYP:[id number] Jeanette Daniels is a 80 y.o. female calls for telephone consult today. Patient provides verbal consent for consult held via phone.  Due to COVID-19 pandemic this visit was conducted virtually. This visit type was conducted due to national recommendations for restrictions regarding the COVID-19 Pandemic (e.g. social distancing, sheltering in place) in an effort to limit this patient's exposure and mitigate transmission in our community. All issues noted in this document were discussed and addressed.  A physical exam was not performed with this format.   Location of patient: home Location of provider: [facility] Others present for call: none  1.  Sinusitis Patient reports sinus pressure (maxillary), bad cough, sneezing and cold chills over the last 2 days.  She thinks there was one sick contact at school on Friday so not sure who has been ill around her. She reports fatigue.  She is using Claritin and Tylenol for symptoms.  ROS: Per HPI  Allergies  Allergen Reactions  . Hydrochlorothiazide Other (See Comments)  . Ace Inhibitors Cough  . Augmentin [Amoxicillin-Pot Clavulanate] Diarrhea    Nausea  . Celecoxib    Past Medical History:  Diagnosis Date  . Breast cancer ([facility])    S/P chemotherapy, radiation, and surgery  . Diabetes mellitus without complication ([facility])   . Hyperlipidemia    x16 years  . Hypertension    x16 years    Current Outpatient Medications:  .  acyclovir ointment (ZOVIRAX) 5 %, Apply 1 application topically every 3 (three) hours., Disp: 30 g, Rfl: 0 .  Ascorbic Acid (VITAMIN C) 100 MG tablet, Take 100 mg by mouth daily., Disp: , Rfl:  .  aspirin EC 81 MG tablet, Take 1 tablet (81 mg total) by mouth daily., Disp: 90 tablet, Rfl: 2 .  atorvastatin (LIPITOR) 20 MG tablet, Take 1 tablet (20 mg total) by mouth daily., Disp: 90 tablet, Rfl: 0 .  Cholecalciferol (VITAMIN D) 2000 UNITS CAPS, Take 1,000  Units by mouth 2 (two) times daily. gummie chewable, Disp: , Rfl:  .  docusate sodium (COLACE) 100 MG capsule, Take 100 mg by mouth daily., Disp: , Rfl:  .  losartan (COZAAR) 50 MG tablet, Take 2 tablets (100 mg total) by mouth daily., Disp: 180 tablet, Rfl: 1 .  metoprolol succinate (TOPROL-XL) 100 MG 24 hr tablet, Take 1 tablet (100 mg total) by mouth 2 (two) times daily. Take with or immediately following a meal., Disp: 180 tablet, Rfl: 1 .  mirabegron ER (MYRBETRIQ) 25 MG TB24 tablet, Take 1 tablet (25 mg total) by mouth daily., Disp: 90 tablet, Rfl: 1 .  ONE TOUCH ULTRA TEST test strip, USE TO CHECK BLOOD SUGAR UP TO 4 TIMES DAILY, Disp: 200 each, Rfl: 5 .  sertraline (ZOLOFT) 100 MG tablet, Take 2 tablets (200 mg total) by mouth daily., Disp: 180 tablet, Rfl: 0 .  zolpidem (AMBIEN) 10 MG tablet, Take 1 tablet (10 mg total) by mouth at bedtime., Disp: 30 tablet, Rfl: 5  Assessment/ Plan: 80 y.o. female   Flu-like symptoms - Plan: Veritor Flu A/B Waived, Novel Coronavirus, NAA ([facility])  Sounds viral we will check for flu and COVID.  However, certainly could be allergy given recent changes in weather.  Continue Claritin.  Recommend Flonase versus nasal saline spray.  We discussed bacterial signs and symptoms and she will contact me should she develop any of these.  She has a cough medication at home and she may continue  that whilst we wait on results of her tests.  A school note was provided.  She will follow-up as needed  Start time: 1:24pm End time: 1:30pm  Total time spent on patient care (including telephone call/ virtual visit): 6 minutes  [place], [place] [phone]

## 2020-11-29 LAB — NOVEL CORONAVIRUS, NAA: SARS-CoV-2, NAA: NOT DETECTED

## 2020-11-29 LAB — SARS-COV-2, NAA 2 DAY TAT

## 2020-11-30 ENCOUNTER — Telehealth: Payer: Self-pay

## 2020-11-30 MED ORDER — DOXYCYCLINE HYCLATE 100 MG PO TABS: 100.0000 mg | ORAL_TABLET | Freq: Two times a day (BID) | ORAL | 0 refills | Status: DC

## 2020-11-30 NOTE — Telephone Encounter (Signed)
Pt states that the mucous is like a greenish color. Denies brown or blood tinged color.  When asked if worse in the am, pt states that she has it all day long.  She does feel better over all and has had less fatigue.

## 2020-11-30 NOTE — Telephone Encounter (Signed)
doxycycline Prescription sent to pharmacy   

## 2020-11-30 NOTE — Telephone Encounter (Signed)
Pt called stating that she had a visit with Dr Lajuana Ripple on 11/28/20 and says she was told to call the office if she starting spitting up any mucus with color, to let her know.  Pt says she is now spitting up mucus with color. Wants Dr Lajuana Ripple or Evelina Dun to advise.

## 2020-11-30 NOTE — Telephone Encounter (Signed)
Patient aware.

## 2020-12-08 ENCOUNTER — Other Ambulatory Visit: Payer: Self-pay | Admitting: Family

## 2020-12-08 DIAGNOSIS — I152 Hypertension secondary to endocrine disorders: Secondary | ICD-10-CM

## 2020-12-08 DIAGNOSIS — E1159 Type 2 diabetes mellitus with other circulatory complications: Secondary | ICD-10-CM

## 2020-12-14 ENCOUNTER — Other Ambulatory Visit: Payer: Self-pay | Admitting: Family

## 2020-12-14 DIAGNOSIS — E1169 Type 2 diabetes mellitus with other specified complication: Secondary | ICD-10-CM

## 2020-12-14 DIAGNOSIS — E785 Hyperlipidemia, unspecified: Secondary | ICD-10-CM

## 2020-12-15 ENCOUNTER — Other Ambulatory Visit: Payer: Self-pay | Admitting: Family

## 2020-12-29 ENCOUNTER — Other Ambulatory Visit: Payer: Self-pay | Admitting: Family

## 2020-12-29 DIAGNOSIS — F411 Generalized anxiety disorder: Secondary | ICD-10-CM

## 2021-01-23 ENCOUNTER — Other Ambulatory Visit: Payer: Self-pay

## 2021-01-23 ENCOUNTER — Encounter: Payer: Self-pay | Admitting: Family

## 2021-01-23 ENCOUNTER — Ambulatory Visit (INDEPENDENT_AMBULATORY_CARE_PROVIDER_SITE_OTHER): Payer: BC Managed Care – PPO | Admitting: Family

## 2021-01-23 VITALS — BP 120/74 | HR 64 | Temp 97.7°F | Ht 67.0 in | Wt 222.2 lb

## 2021-01-23 DIAGNOSIS — E1169 Type 2 diabetes mellitus with other specified complication: Secondary | ICD-10-CM | POA: Diagnosis not present

## 2021-01-23 DIAGNOSIS — E785 Hyperlipidemia, unspecified: Secondary | ICD-10-CM

## 2021-01-23 DIAGNOSIS — F411 Generalized anxiety disorder: Secondary | ICD-10-CM

## 2021-01-23 DIAGNOSIS — N3281 Overactive bladder: Secondary | ICD-10-CM

## 2021-01-23 DIAGNOSIS — E1159 Type 2 diabetes mellitus with other circulatory complications: Secondary | ICD-10-CM | POA: Diagnosis not present

## 2021-01-23 DIAGNOSIS — K5900 Constipation, unspecified: Secondary | ICD-10-CM

## 2021-01-23 DIAGNOSIS — E559 Vitamin D deficiency, unspecified: Secondary | ICD-10-CM

## 2021-01-23 DIAGNOSIS — G4700 Insomnia, unspecified: Secondary | ICD-10-CM

## 2021-01-23 DIAGNOSIS — E669 Obesity, unspecified: Secondary | ICD-10-CM | POA: Diagnosis not present

## 2021-01-23 DIAGNOSIS — Z79899 Other long term (current) drug therapy: Secondary | ICD-10-CM

## 2021-01-23 DIAGNOSIS — I152 Hypertension secondary to endocrine disorders: Secondary | ICD-10-CM

## 2021-01-23 LAB — BAYER DCA HB A1C WAIVED: HB A1C (BAYER DCA - WAIVED): 6.2 % (ref <7.0)

## 2021-01-23 MED ORDER — MECLIZINE HCL 25 MG PO TABS: 25.0000 mg | ORAL_TABLET | Freq: Three times a day (TID) | ORAL | 1 refills | Status: DC | PRN

## 2021-01-23 MED ORDER — ZOLPIDEM TARTRATE 10 MG PO TABS: 10.0000 mg | ORAL_TABLET | Freq: Every day | ORAL | 5 refills | Status: DC

## 2021-01-23 NOTE — Patient Instructions (Signed)
Health Maintenance After Age 79 After age 79, you are at a higher risk for certain long-term diseases and infections as well as injuries from falls. Falls are a major cause of broken bones and head injuries in people who are older than age 79. Getting regular preventive care can help to keep you healthy and well. Preventive care includes getting regular testing and making lifestyle changes as recommended by your health care provider. Talk with your health care provider about:  Which screenings and tests you should have. A screening is a test that checks for a disease when you have no symptoms.  A diet and exercise plan that is right for you. What should I know about screenings and tests to prevent falls? Screening and testing are the best ways to find a health problem early. Early diagnosis and treatment give you the best chance of managing medical conditions that are common after age 79. Certain conditions and lifestyle choices may make you more likely to have a fall. Your health care provider may recommend:  Regular vision checks. Poor vision and conditions such as cataracts can make you more likely to have a fall. If you wear glasses, make sure to get your prescription updated if your vision changes.  Medicine review. Work with your health care provider to regularly review all of the medicines you are taking, including over-the-counter medicines. Ask your health care provider about any side effects that may make you more likely to have a fall. Tell your health care provider if any medicines that you take make you feel dizzy or sleepy.  Osteoporosis screening. Osteoporosis is a condition that causes the bones to get weaker. This can make the bones weak and cause them to break more easily.  Blood pressure screening. Blood pressure changes and medicines to control blood pressure can make you feel dizzy.  Strength and balance checks. Your health care provider may recommend certain tests to check your  strength and balance while standing, walking, or changing positions.  Foot health exam. Foot pain and numbness, as well as not wearing proper footwear, can make you more likely to have a fall.  Depression screening. You may be more likely to have a fall if you have a fear of falling, feel emotionally low, or feel unable to do activities that you used to do.  Alcohol use screening. Using too much alcohol can affect your balance and may make you more likely to have a fall. What actions can I take to lower my risk of falls? General instructions  Talk with your health care provider about your risks for falling. Tell your health care provider if: ? You fall. Be sure to tell your health care provider about all falls, even ones that seem minor. ? You feel dizzy, sleepy, or off-balance.  Take over-the-counter and prescription medicines only as told by your health care provider. These include any supplements.  Eat a healthy diet and maintain a healthy weight. A healthy diet includes low-fat dairy products, low-fat (lean) meats, and fiber from whole grains, beans, and lots of fruits and vegetables. Home safety  Remove any tripping hazards, such as rugs, cords, and clutter.  Install safety equipment such as grab bars in bathrooms and safety rails on stairs.  Keep rooms and walkways well-lit. Activity  Follow a regular exercise program to stay fit. This will help you maintain your balance. Ask your health care provider what types of exercise are appropriate for you.  If you need a cane or walker,   use it as recommended by your health care provider.  Wear supportive shoes that have nonskid soles.   Lifestyle  Do not drink alcohol if your health care provider tells you not to drink.  If you drink alcohol, limit how much you have: ? 0-1 drink a day for women. ? 0-2 drinks a day for men.  Be aware of how much alcohol is in your drink. In the [place] one drink equals one typical bottle of beer (12  oz), one-half glass of wine (5 oz), or one shot of hard liquor (1 oz).  Do not use any products that contain nicotine or tobacco, such as cigarettes and e-cigarettes. If you need help quitting, ask your health care provider. Summary  Having a healthy lifestyle and getting preventive care can help to protect your health and wellness after age 79.  Screening and testing are the best way to find a health problem early and help you avoid having a fall. Early diagnosis and treatment give you the best chance for managing medical conditions that are more common for people who are older than age 79.  Falls are a major cause of broken bones and head injuries in people who are older than age 79. Take precautions to prevent a fall at home.  Work with your health care provider to learn what changes you can make to improve your health and wellness and to prevent falls. This information is not intended to replace advice given to you by your health care provider. Make sure you discuss any questions you have with your health care provider. Document Revised: 12/24/2018 Document Reviewed: 07/16/2017 Elsevier Patient Education  2021 Elsevier Inc.  

## 2021-01-23 NOTE — Progress Notes (Signed)
Subjective:    Patient ID: Jeanette Daniels, female    DOB: 08-22-1941, 80 y.o.   MRN: [id number]  Chief Complaint  Patient presents with  . Medical Management of Chronic Issues  . Diabetes  . Hypertension   [name] She is planning to retire in July this year.   She has hx of left breast cancer in 2006 and is followed by Oncologists annually.  Diabetes She presents for her follow-up diabetic visit. She has type 2 diabetes mellitus. Hypoglycemia symptoms include nervousness/anxiousness. Pertinent negatives for diabetes include no blurred vision and no foot paresthesias. Symptoms are stable. Pertinent negatives for diabetic complications include no heart disease. Risk factors for coronary artery disease include dyslipidemia, diabetes mellitus, hypertension and sedentary lifestyle. She is following a generally healthy diet. Her overall blood glucose range is 140-180 mg/dl. An ACE inhibitor/angiotensin II receptor blocker is being taken.  Hypertension This is a chronic problem. The current episode started more than 1 year ago. The problem has been resolved since onset. The problem is controlled. Associated symptoms include anxiety, malaise/fatigue and peripheral edema. Pertinent negatives include no blurred vision or shortness of breath. Risk factors for coronary artery disease include dyslipidemia, diabetes mellitus, obesity and sedentary lifestyle. The current treatment provides moderate improvement.  Hyperlipidemia This is a chronic problem. The current episode started more than 1 year ago. The problem is controlled. Recent lipid tests were reviewed and are normal. Exacerbating diseases include obesity. Pertinent negatives include no shortness of breath. Current antihyperlipidemic treatment includes statins. The current treatment provides moderate improvement of lipids. Risk factors for coronary artery disease include dyslipidemia, hypertension and a  sedentary lifestyle.  Insomnia Primary symptoms: difficulty falling asleep, malaise/fatigue.  The current episode started more than one year. The onset quality is gradual. The problem occurs intermittently. The treatment provided moderate relief.  Anxiety Presents for follow-up visit. Symptoms include depressed mood, excessive worry, insomnia, irritability and nervous/anxious behavior. Patient reports no shortness of breath. Symptoms occur occasionally. The severity of symptoms is mild.    Constipation This is a chronic problem. The current episode started more than 1 year ago. The problem has been waxing and waning since onset. Risk factors include obesity. She has tried laxatives for the symptoms. The treatment provided moderate relief.      Review of Systems  Constitutional: Positive for irritability and malaise/fatigue.  Eyes: Negative for blurred vision.  Respiratory: Negative for shortness of breath.   Gastrointestinal: Positive for constipation.  Psychiatric/Behavioral: The patient is nervous/anxious and has insomnia.   All other systems reviewed and are negative.      Objective:   Physical Exam Vitals reviewed.  Constitutional:      General: She is not in acute distress.    Appearance: She is well-developed.  HENT:     Head: Normocephalic and atraumatic.     Right Ear: Tympanic membrane normal.     Left Ear: Tympanic membrane normal.  Eyes:     Pupils: Pupils are equal, round, and reactive to light.  Neck:     Thyroid: No thyromegaly.  Cardiovascular:     Rate and Rhythm: Normal rate and regular rhythm.     Heart sounds: Normal heart sounds. No murmur heard.   Pulmonary:     Effort: Pulmonary effort is normal. No respiratory distress.     Breath sounds: Normal breath sounds. No wheezing.  Abdominal:     General: Bowel sounds are normal. There is no distension.  Palpations: Abdomen is soft.     Tenderness: There is no abdominal tenderness.  Musculoskeletal:         General: No tenderness. Normal range of motion.     Cervical back: Normal range of motion and neck supple.     Right lower leg: Edema (trace) present.     Left lower leg: Edema (trace) present.  Skin:    General: Skin is warm and dry.  Neurological:     Mental Status: She is alert and oriented to person, place, and time.     Cranial Nerves: No cranial nerve deficit.     Deep Tendon Reflexes: Reflexes are normal and symmetric.  Psychiatric:        Behavior: Behavior normal.        Thought Content: Thought content normal.        Judgment: Judgment normal.          BP 120/74   Pulse 64   Temp 97.7 F (36.5 C) (Temporal)   Ht '5\' 7"'  (1.702 m)   Wt 222 lb 3.2 oz (100.8 kg)   BMI 34.80 kg/m   Assessment & Plan:  Jeanette Daniels comes in today with chief complaint of Medical Management of Chronic Issues, Diabetes, and Hypertension   Diagnosis and orders addressed:  1. Hypertension associated with diabetes ([place]) - CMP14+EGFR - CBC with Differential/Platelet  2. Hyperlipidemia associated with type 2 diabetes mellitus ([facility]) - CMP14+EGFR - CBC with Differential/Platelet  3. Type 2 diabetes mellitus with other specified complication, without long-term current use of insulin ([facility]) - Bayer DCA Hb A1c Waived - CMP14+EGFR - CBC with Differential/Platelet  4. Vitamin D deficiency - CMP14+EGFR - CBC with Differential/Platelet  5. Obesity (BMI 30-39.9) - CMP14+EGFR - CBC with Differential/Platelet  6. Insomnia, unspecified type - CMP14+EGFR - CBC with Differential/Platelet  7. GAD (generalized anxiety disorder) - CMP14+EGFR - CBC with Differential/Platelet  8. Controlled substance agreement signed - CMP14+EGFR - CBC with Differential/Platelet  9. Constipation, unspecified constipation type - CMP14+EGFR - CBC with Differential/Platelet   Labs pending Patient reviewed in [place] controlled database, no flags noted. Contract and drug screen are up to date.  Health  Maintenance reviewed Diet and exercise encouraged  Follow up plan:  6 months   Jeanette Dun, FNP

## 2021-01-24 LAB — CBC WITH DIFFERENTIAL/PLATELET
Basophils Absolute: 0 10*3/uL (ref 0.0–0.2)
Basos: 0 %
EOS (ABSOLUTE): 0.2 10*3/uL (ref 0.0–0.4)
Eos: 2 %
Hematocrit: 38.7 % (ref 34.0–46.6)
Hemoglobin: 12.9 g/dL (ref 11.1–15.9)
Immature Grans (Abs): 0 10*3/uL (ref 0.0–0.1)
Immature Granulocytes: 0 %
Lymphocytes Absolute: 2 10*3/uL (ref 0.7–3.1)
Lymphs: 28 %
MCH: 29.1 pg (ref 26.6–33.0)
MCHC: 33.3 g/dL (ref 31.5–35.7)
MCV: 87 fL (ref 79–97)
Monocytes Absolute: 0.6 10*3/uL (ref 0.1–0.9)
Monocytes: 8 %
Neutrophils Absolute: 4.3 10*3/uL (ref 1.4–7.0)
Neutrophils: 62 %
Platelets: 233 10*3/uL (ref 150–450)
RBC: 4.44 x10E6/uL (ref 3.77–5.28)
RDW: 12.2 % (ref 11.7–15.4)
WBC: 7.2 10*3/uL (ref 3.4–10.8)

## 2021-01-24 LAB — CMP14+EGFR
ALT: 12 IU/L (ref 0–32)
AST: 14 IU/L (ref 0–40)
Albumin/Globulin Ratio: 1.8 (ref 1.2–2.2)
Albumin: 4.5 g/dL (ref 3.7–4.7)
Alkaline Phosphatase: 88 IU/L (ref 44–121)
BUN/Creatinine Ratio: 32 — ABNORMAL HIGH (ref 12–28)
BUN: 21 mg/dL (ref 8–27)
Bilirubin Total: 0.3 mg/dL (ref 0.0–1.2)
CO2: 26 mmol/L (ref 20–29)
Calcium: 9.9 mg/dL (ref 8.7–10.3)
Chloride: 104 mmol/L (ref 96–106)
Creatinine, Ser: 0.65 mg/dL (ref 0.57–1.00)
Globulin, Total: 2.5 g/dL (ref 1.5–4.5)
Glucose: 98 mg/dL (ref 65–99)
Potassium: 5.1 mmol/L (ref 3.5–5.2)
Sodium: 141 mmol/L (ref 134–144)
Total Protein: 7 g/dL (ref 6.0–8.5)
eGFR: 90 mL/min/{1.73_m2} (ref >59)

## 2021-01-26 ENCOUNTER — Telehealth: Payer: Self-pay

## 2021-01-26 NOTE — Telephone Encounter (Signed)
Pt went to pick up her refill of her Mirabegron ER Rx and was told that it required a PA.  Please call patient with update on when she may be able to get her medicine refilled.

## 2021-01-29 NOTE — Telephone Encounter (Signed)
Your information has been submitted to [employer]. To check for an updated outcome later, reopen this PA request from your dashboard. If [employer] has not responded to your request within 24 hours, contact [employer] at [phone]. If you think there may be a problem with your PA request, use our live chat feature at the bottom right.    

## 2021-01-29 NOTE — Telephone Encounter (Signed)
Called and left patient detailed message.

## 2021-01-31 NOTE — Telephone Encounter (Signed)
Denied on May 16 Your PA request has been denied. Additional information will be provided in the denial communication. (Message 1140) Your PA request has been denied. Additional information will be provided in the denial communication. (Message 1140

## 2021-02-01 NOTE — Telephone Encounter (Signed)
Formulary alternatives are darifenacin ext-rel, oxybutynin ext-rel, solifenacin, tolterodine, tolterodine ext-rel, trospium, trospium ext-rel, gemtesa, toviaz.

## 2021-03-28 ENCOUNTER — Other Ambulatory Visit: Payer: Self-pay | Admitting: Family

## 2021-03-28 DIAGNOSIS — F411 Generalized anxiety disorder: Secondary | ICD-10-CM

## 2021-04-26 ENCOUNTER — Other Ambulatory Visit: Payer: Self-pay

## 2021-04-26 ENCOUNTER — Ambulatory Visit: Payer: Medicare Other | Admitting: Family

## 2021-04-27 ENCOUNTER — Encounter: Payer: Self-pay | Admitting: Family

## 2021-04-27 ENCOUNTER — Ambulatory Visit (INDEPENDENT_AMBULATORY_CARE_PROVIDER_SITE_OTHER): Payer: Medicare Other | Admitting: Family

## 2021-04-27 VITALS — BP 134/60 | HR 70 | Temp 98.2°F | Wt 220.4 lb

## 2021-04-27 DIAGNOSIS — Z23 Encounter for immunization: Secondary | ICD-10-CM | POA: Diagnosis not present

## 2021-04-27 DIAGNOSIS — E1169 Type 2 diabetes mellitus with other specified complication: Secondary | ICD-10-CM

## 2021-04-27 DIAGNOSIS — E669 Obesity, unspecified: Secondary | ICD-10-CM | POA: Diagnosis not present

## 2021-04-27 DIAGNOSIS — Z79899 Other long term (current) drug therapy: Secondary | ICD-10-CM

## 2021-04-27 DIAGNOSIS — E785 Hyperlipidemia, unspecified: Secondary | ICD-10-CM

## 2021-04-27 DIAGNOSIS — E1159 Type 2 diabetes mellitus with other circulatory complications: Secondary | ICD-10-CM

## 2021-04-27 DIAGNOSIS — I152 Hypertension secondary to endocrine disorders: Secondary | ICD-10-CM

## 2021-04-27 DIAGNOSIS — F411 Generalized anxiety disorder: Secondary | ICD-10-CM

## 2021-04-27 DIAGNOSIS — G4700 Insomnia, unspecified: Secondary | ICD-10-CM

## 2021-04-27 DIAGNOSIS — E559 Vitamin D deficiency, unspecified: Secondary | ICD-10-CM

## 2021-04-27 DIAGNOSIS — K5900 Constipation, unspecified: Secondary | ICD-10-CM

## 2021-04-27 MED ORDER — ZOLPIDEM TARTRATE 10 MG PO TABS: 10.0000 mg | ORAL_TABLET | Freq: Every day | ORAL | 5 refills | Status: DC

## 2021-04-27 NOTE — Patient Instructions (Signed)
Health Maintenance, Female Adopting a healthy lifestyle and getting preventive care are important in promoting health and wellness. Ask your health care provider about: The right schedule for you to have regular tests and exams. Things you can do on your own to prevent diseases and keep yourself healthy. What should I know about diet, weight, and exercise? Eat a healthy diet  Eat a diet that includes plenty of vegetables, fruits, low-fat dairy products, and lean protein. Do not eat a lot of foods that are high in solid fats, added sugars, or sodium.  Maintain a healthy weight Body mass index (BMI) is used to identify weight problems. It estimates body fat based on height and weight. Your health care provider can help determineyour BMI and help you achieve or maintain a healthy weight. Get regular exercise Get regular exercise. This is one of the most important things you can do for your health. Most adults should: Exercise for at least 150 minutes each week. The exercise should increase your heart rate and make you sweat (moderate-intensity exercise). Do strengthening exercises at least twice a week. This is in addition to the moderate-intensity exercise. Spend less time sitting. Even light physical activity can be beneficial. Watch cholesterol and blood lipids Have your blood tested for lipids and cholesterol at 79 years of age, then havethis test every 5 years. Have your cholesterol levels checked more often if: Your lipid or cholesterol levels are high. You are older than 79 years of age. You are at high risk for heart disease. What should I know about cancer screening? Depending on your health history and family history, you may need to have cancer screening at various ages. This may include screening for: Breast cancer. Cervical cancer. Colorectal cancer. Skin cancer. Lung cancer. What should I know about heart disease, diabetes, and high blood pressure? Blood pressure and heart  disease High blood pressure causes heart disease and increases the risk of stroke. This is more likely to develop in people who have high blood pressure readings, are of African descent, or are overweight. Have your blood pressure checked: Every 3-5 years if you are 18-39 years of age. Every year if you are 40 years old or older. Diabetes Have regular diabetes screenings. This checks your fasting blood sugar level. Have the screening done: Once every three years after age 40 if you are at a normal weight and have a low risk for diabetes. More often and at a younger age if you are overweight or have a high risk for diabetes. What should I know about preventing infection? Hepatitis B If you have a higher risk for hepatitis B, you should be screened for this virus. Talk with your health care provider to find out if you are at risk forhepatitis B infection. Hepatitis C Testing is recommended for: Everyone born from 1945 through 1965. Anyone with known risk factors for hepatitis C. Sexually transmitted infections (STIs) Get screened for STIs, including gonorrhea and chlamydia, if: You are sexually active and are younger than 79 years of age. You are older than 79 years of age and your health care provider tells you that you are at risk for this type of infection. Your sexual activity has changed since you were last screened, and you are at increased risk for chlamydia or gonorrhea. Ask your health care provider if you are at risk. Ask your health care provider about whether you are at high risk for HIV. Your health care provider may recommend a prescription medicine to help   prevent HIV infection. If you choose to take medicine to prevent HIV, you should first get tested for HIV. You should then be tested every 3 months for as long as you are taking the medicine. Pregnancy If you are about to stop having your period (premenopausal) and you may become pregnant, seek counseling before you get  pregnant. Take 400 to 800 micrograms (mcg) of folic acid every day if you become pregnant. Ask for birth control (contraception) if you want to prevent pregnancy. Osteoporosis and menopause Osteoporosis is a disease in which the bones lose minerals and strength with aging. This can result in bone fractures. If you are 65 years old or older, or if you are at risk for osteoporosis and fractures, ask your health care provider if you should: Be screened for bone loss. Take a calcium or vitamin D supplement to lower your risk of fractures. Be given hormone replacement therapy (HRT) to treat symptoms of menopause. Follow these instructions at home: Lifestyle Do not use any products that contain nicotine or tobacco, such as cigarettes, e-cigarettes, and chewing tobacco. If you need help quitting, ask your health care provider. Do not use street drugs. Do not share needles. Ask your health care provider for help if you need support or information about quitting drugs. Alcohol use Do not drink alcohol if: Your health care provider tells you not to drink. You are pregnant, may be pregnant, or are planning to become pregnant. If you drink alcohol: Limit how much you use to 0-1 drink a day. Limit intake if you are breastfeeding. Be aware of how much alcohol is in your drink. In the [place] one drink equals one 12 oz bottle of beer (355 mL), one 5 oz glass of wine (148 mL), or one 1 oz glass of hard liquor (44 mL). General instructions Schedule regular health, dental, and eye exams. Stay current with your vaccines. Tell your health care provider if: You often feel depressed. You have ever been abused or do not feel safe at home. Summary Adopting a healthy lifestyle and getting preventive care are important in promoting health and wellness. Follow your health care provider's instructions about healthy diet, exercising, and getting tested or screened for diseases. Follow your health care provider's  instructions on monitoring your cholesterol and blood pressure. This information is not intended to replace advice given to you by your health care provider. Make sure you discuss any questions you have with your healthcare provider. Document Revised: 08/26/2018 Document Reviewed: 08/26/2018 Elsevier Patient Education  2022 Elsevier Inc.  

## 2021-04-27 NOTE — Progress Notes (Signed)
Subjective:    Patient ID: Jeanette Daniels, female    DOB: 07/28/1941, 80 y.o.   MRN: [id number]  Chief Complaint  Patient presents with   Medical Management of Chronic Issues   Pt calls  the office today for chronic follow up. She is retired now and enjoying life.    She has hx of left breast cancer in 2006 and is followed by Oncologists annually.  Hypertension This is a chronic problem. The current episode started more than 1 year ago. The problem has been resolved since onset. The problem is controlled. Associated symptoms include anxiety, malaise/fatigue and peripheral edema. Pertinent negatives include no blurred vision. Risk factors for coronary artery disease include dyslipidemia and obesity. The current treatment provides moderate improvement.  Hyperlipidemia This is a chronic problem. The current episode started more than 1 year ago. The problem is uncontrolled. Exacerbating diseases include obesity. Current antihyperlipidemic treatment includes statins. The current treatment provides moderate improvement of lipids. Risk factors for coronary artery disease include dyslipidemia, diabetes mellitus, hypertension and a sedentary lifestyle.  Diabetes She presents for her follow-up diabetic visit. She has type 2 diabetes mellitus. Hypoglycemia symptoms include nervousness/anxiousness. Pertinent negatives for diabetes include no blurred vision. Symptoms are stable. Diabetic complications include heart disease. Risk factors for coronary artery disease include diabetes mellitus, hypertension and sedentary lifestyle. She is following a generally healthy diet. Her overall blood glucose range is 110-130 mg/dl. An ACE inhibitor/angiotensin II receptor blocker is being taken.  Insomnia Primary symptoms: difficulty falling asleep, frequent awakening, malaise/fatigue.   The current episode started more than one year. The onset quality is gradual. The problem occurs intermittently.  Anxiety Presents for  follow-up visit. Symptoms include excessive worry, insomnia, irritability and nervous/anxious behavior. The severity of symptoms is moderate.    Constipation This is a chronic problem. The current episode started more than 1 year ago. Risk factors include obesity. She has tried laxatives for the symptoms. The treatment provided moderate relief.     Review of Systems  Constitutional:  Positive for irritability and malaise/fatigue.  Eyes:  Negative for blurred vision.  Gastrointestinal:  Positive for constipation.  Psychiatric/Behavioral:  The patient is nervous/anxious and has insomnia.   All other systems reviewed and are negative.     Objective:   Physical Exam Vitals reviewed.  Constitutional:      General: She is not in acute distress.    Appearance: She is well-developed. She is obese.  HENT:     Head: Normocephalic and atraumatic.     Right Ear: Tympanic membrane normal.     Left Ear: Tympanic membrane normal.  Eyes:     Pupils: Pupils are equal, round, and reactive to light.  Neck:     Thyroid: No thyromegaly.  Cardiovascular:     Rate and Rhythm: Normal rate and regular rhythm.     Heart sounds: Normal heart sounds. No murmur heard. Pulmonary:     Effort: Pulmonary effort is normal. No respiratory distress.     Breath sounds: Normal breath sounds. No wheezing.  Abdominal:     General: Bowel sounds are normal. There is no distension.     Palpations: Abdomen is soft.     Tenderness: There is no abdominal tenderness.  Musculoskeletal:        General: No tenderness. Normal range of motion.     Cervical back: Normal range of motion and neck supple.  Skin:    General: Skin is warm and dry.  Neurological:  Mental Status: She is alert and oriented to person, place, and time.     Cranial Nerves: No cranial nerve deficit.     Deep Tendon Reflexes: Reflexes are normal and symmetric.  Psychiatric:        Behavior: Behavior normal.        Thought Content: Thought  content normal.        Judgment: Judgment normal.      BP 134/60   Pulse 70   Temp 98.2 F (36.8 C) (Temporal)   Wt 220 lb 6.4 oz (100 kg)   SpO2 98%   BMI 34.52 kg/m      Assessment & Plan:  Jeanette Daniels comes in today with chief complaint of Medical Management of Chronic Issues   Diagnosis and orders addressed:  1. Insomnia, unspecified type - zolpidem (AMBIEN) 10 MG tablet; Take 1 tablet (10 mg total) by mouth at bedtime.  Dispense: 30 tablet; Refill: 5 - CMP14+EGFR - ToxASSURE Select 13 (MW), Urine  2. Hypertension associated with diabetes ([place]) - CMP14+EGFR  3. Hyperlipidemia associated with type 2 diabetes mellitus ([facility]) - CMP14+EGFR  4. Type 2 diabetes mellitus with other specified complication, without long-term current use of insulin ([facility])  - Bayer DCA Hb A1c Waived - CMP14+EGFR - Microalbumin / creatinine urine ratio  5. Obesity (BMI 30-39.9) - CMP14+EGFR  6. Controlled substance agreement signed - CMP14+EGFR - ToxASSURE Select 13 (MW), Urine  7. Vitamin D deficiency - CMP14+EGFR  8. GAD (generalized anxiety disorder) - CMP14+EGFR  9. Constipation, unspecified constipation type - CMP14+EGFR  10. Need for Tdap vaccination - Tdap vaccine greater than or equal to 7yo IM - CMP14+EGFR   Labs pending Patient reviewed in [place] controlled database, no flags noted. Contract and drug screen are up to date.  Health Maintenance reviewed Diet and exercise encouraged  Follow up plan: 6 months    Evelina Dun, FNP

## 2021-04-28 LAB — MICROALBUMIN / CREATININE URINE RATIO
Creatinine, Urine: 103.1 mg/dL
Microalb/Creat Ratio: 6 mg/g creat (ref 0–29)
Microalbumin, Urine: 6.3 ug/mL

## 2021-04-30 ENCOUNTER — Ambulatory Visit (INDEPENDENT_AMBULATORY_CARE_PROVIDER_SITE_OTHER): Payer: BC Managed Care – PPO

## 2021-04-30 VITALS — Ht 67.0 in | Wt 220.0 lb

## 2021-04-30 DIAGNOSIS — Z0000 Encounter for general adult medical examination without abnormal findings: Secondary | ICD-10-CM

## 2021-04-30 NOTE — Patient Instructions (Signed)
Jeanette Daniels , Thank you for taking time to come for your Medicare Wellness Visit. I appreciate your ongoing commitment to your health goals. Please review the following plan we discussed and let me know if I can assist you in the future.   Screening recommendations/referrals: Colonoscopy: Done 11/01/2004 - no longer required Mammogram: Appointment today 04/30/21 Bone Density: Done 05/11/2018 - Repeat every 2 years *next visit Recommended yearly ophthalmology/optometry visit for glaucoma screening and checkup Recommended yearly dental visit for hygiene and checkup  Vaccinations: Influenza vaccine: Done 06/23/2020 - Repeat annually  Pneumococcal vaccine: Done 05/24/2014 & 06/16/2010 Tdap vaccine: Done 04/27/2021 - Repeat in 10 years Shingles vaccine: Zostavax done 2007; Shingrix discussed. Please contact your pharmacy for coverage information.     Covid-19: Done 10/09/19, 11/07/19, & 07/12/2020  Advanced directives: Please bring a copy of your health care power of attorney and living will to the office to be added to your chart at your convenience.   Conditions/risks identified: Aim for 30 minutes of exercise or brisk walking each day, drink 6-8 glasses of water and eat lots of fruits and vegetables.   Next appointment: Follow up in one year for your annual wellness visit    Preventive Care 65 Years and Older, Female Preventive care refers to lifestyle choices and visits with your health care provider that can promote health and wellness. What does preventive care include? A yearly physical exam. This is also called an annual well check. Dental exams once or twice a year. Routine eye exams. Ask your health care provider how often you should have your eyes checked. Personal lifestyle choices, including: Daily care of your teeth and gums. Regular physical activity. Eating a healthy diet. Avoiding tobacco and drug use. Limiting alcohol use. Practicing safe sex. Taking low-dose aspirin every  day. Taking vitamin and mineral supplements as recommended by your health care provider. What happens during an annual well check? The services and screenings done by your health care provider during your annual well check will depend on your age, overall health, lifestyle risk factors, and family history of disease. Counseling  Your health care provider may ask you questions about your: Alcohol use. Tobacco use. Drug use. Emotional well-being. Home and relationship well-being. Sexual activity. Eating habits. History of falls. Memory and ability to understand (cognition). Work and work Statistician. Reproductive health. Screening  You may have the following tests or measurements: Height, weight, and BMI. Blood pressure. Lipid and cholesterol levels. These may be checked every 5 years, or more frequently if you are over 70 years old. Skin check. Lung cancer screening. You may have this screening every year starting at age 26 if you have a 30-pack-year history of smoking and currently smoke or have quit within the past 15 years. Fecal occult blood test (FOBT) of the stool. You may have this test every year starting at age 57. Flexible sigmoidoscopy or colonoscopy. You may have a sigmoidoscopy every 5 years or a colonoscopy every 10 years starting at age 83. Hepatitis C blood test. Hepatitis B blood test. Sexually transmitted disease (STD) testing. Diabetes screening. This is done by checking your blood sugar (glucose) after you have not eaten for a while (fasting). You may have this done every 1-3 years. Bone density scan. This is done to screen for osteoporosis. You may have this done starting at age 59. Mammogram. This may be done every 1-2 years. Talk to your health care provider about how often you should have regular mammograms. Talk with your health  care provider about your test results, treatment options, and if necessary, the need for more tests. Vaccines  Your health care  provider may recommend certain vaccines, such as: Influenza vaccine. This is recommended every year. Tetanus, diphtheria, and acellular pertussis (Tdap, Td) vaccine. You may need a Td booster every 10 years. Zoster vaccine. You may need this after age 34. Pneumococcal 13-valent conjugate (PCV13) vaccine. One dose is recommended after age 53. Pneumococcal polysaccharide (PPSV23) vaccine. One dose is recommended after age 14. Talk to your health care provider about which screenings and vaccines you need and how often you need them. This information is not intended to replace advice given to you by your health care provider. Make sure you discuss any questions you have with your health care provider. Document Released: 09/29/2015 Document Revised: 05/22/2016 Document Reviewed: 07/04/2015 Elsevier Interactive Patient Education  2017 [place] Prevention in the Home Falls can cause injuries. They can happen to people of all ages. There are many things you can do to make your home safe and to help prevent falls. What can I do on the outside of my home? Regularly fix the edges of walkways and driveways and fix any cracks. Remove anything that might make you trip as you walk through a door, such as a raised step or threshold. Trim any bushes or trees on the path to your home. Use bright outdoor lighting. Clear any walking paths of anything that might make someone trip, such as rocks or tools. Regularly check to see if handrails are loose or broken. Make sure that both sides of any steps have handrails. Any raised decks and porches should have guardrails on the edges. Have any leaves, snow, or ice cleared regularly. Use sand or salt on walking paths during winter. Clean up any spills in your garage right away. This includes oil or grease spills. What can I do in the bathroom? Use night lights. Install grab bars by the toilet and in the tub and shower. Do not use towel bars as grab  bars. Use non-skid mats or decals in the tub or shower. If you need to sit down in the shower, use a plastic, non-slip stool. Keep the floor dry. Clean up any water that spills on the floor as soon as it happens. Remove soap buildup in the tub or shower regularly. Attach bath mats securely with double-sided non-slip rug tape. Do not have throw rugs and other things on the floor that can make you trip. What can I do in the bedroom? Use night lights. Make sure that you have a light by your bed that is easy to reach. Do not use any sheets or blankets that are too big for your bed. They should not hang down onto the floor. Have a firm chair that has side arms. You can use this for support while you get dressed. Do not have throw rugs and other things on the floor that can make you trip. What can I do in the kitchen? Clean up any spills right away. Avoid walking on wet floors. Keep items that you use a lot in easy-to-reach places. If you need to reach something above you, use a strong step stool that has a grab bar. Keep electrical cords out of the way. Do not use floor polish or wax that makes floors slippery. If you must use wax, use non-skid floor wax. Do not have throw rugs and other things on the floor that can make you trip. What can  I do with my stairs? Do not leave any items on the stairs. Make sure that there are handrails on both sides of the stairs and use them. Fix handrails that are broken or loose. Make sure that handrails are as long as the stairways. Check any carpeting to make sure that it is firmly attached to the stairs. Fix any carpet that is loose or worn. Avoid having throw rugs at the top or bottom of the stairs. If you do have throw rugs, attach them to the floor with carpet tape. Make sure that you have a light switch at the top of the stairs and the bottom of the stairs. If you do not have them, ask someone to add them for you. What else can I do to help prevent  falls? Wear shoes that: Do not have high heels. Have rubber bottoms. Are comfortable and fit you well. Are closed at the toe. Do not wear sandals. If you use a stepladder: Make sure that it is fully opened. Do not climb a closed stepladder. Make sure that both sides of the stepladder are locked into place. Ask someone to hold it for you, if possible. Clearly mark and make sure that you can see: Any grab bars or handrails. First and last steps. Where the edge of each step is. Use tools that help you move around (mobility aids) if they are needed. These include: Canes. Walkers. Scooters. Crutches. Turn on the lights when you go into a dark area. Replace any light bulbs as soon as they burn out. Set up your furniture so you have a clear path. Avoid moving your furniture around. If any of your floors are uneven, fix them. If there are any pets around you, be aware of where they are. Review your medicines with your doctor. Some medicines can make you feel dizzy. This can increase your chance of falling. Ask your doctor what other things that you can do to help prevent falls. This information is not intended to replace advice given to you by your health care provider. Make sure you discuss any questions you have with your health care provider. Document Released: 06/29/2009 Document Revised: 02/08/2016 Document Reviewed: 10/07/2014 Elsevier Interactive Patient Education  2017 [employer].

## 2021-04-30 NOTE — Progress Notes (Signed)
Subjective:   LOUBERTA HELDRETH is a 80 y.o. female who presents for Medicare Annual (Subsequent) preventive examination.  Virtual Visit via Telephone Note  I connected with  RAYZA CRANNELL on 04/30/21 at  1:15 PM EDT by telephone and verified that I am speaking with the correct person using two identifiers.  Location: Patient: Home Provider: [facility] Persons participating in the virtual visit: patient/Nurse Health Advisor   I discussed the limitations, risks, security and privacy concerns of performing an evaluation and management service by telephone and the availability of in person appointments. The patient expressed understanding and agreed to proceed.  Interactive audio and video telecommunications were attempted between this nurse and patient, however failed, due to patient having technical difficulties OR patient did not have access to video capability.  We continued and completed visit with audio only.  Some vital signs may be absent or patient reported.   Arliss Hepburn E Nyra Anspaugh, LPN   Review of Systems     Cardiac Risk Factors include: advanced age (>88mn, >>41women);diabetes mellitus;dyslipidemia;hypertension;sedentary lifestyle;obesity (BMI >30kg/m2)     Objective:    Today's Vitals   04/30/21 1320  Weight: 220 lb (99.8 kg)  Height: '5\' 7"'$  (1.702 m)   Body mass index is 34.46 kg/m.  Advanced Directives 04/30/2021 04/26/2020 03/02/2019  Does Patient Have a Medical Advance Directive? Yes Yes No  Type of AParamedicof AHollisterLiving will HShields-  Does patient want to make changes to medical advance directive? - No - Patient declined -  Copy of [place] Chart? No - copy requested No - copy requested -  Would patient like information on creating a medical advance directive? - - No - Patient declined    Current Medications (verified) Outpatient Encounter Medications as of 04/30/2021  Medication Sig   acyclovir  ointment (ZOVIRAX) 5 % Apply 1 application topically every 3 (three) hours.   Ascorbic Acid (VITAMIN C) 100 MG tablet Take 100 mg by mouth daily.   aspirin EC 81 MG tablet Take 1 tablet (81 mg total) by mouth daily.   atorvastatin (LIPITOR) 20 MG tablet Take 1 tablet (20 mg total) by mouth daily.   Cholecalciferol (VITAMIN D) 2000 UNITS CAPS Take 1,000 Units by mouth 2 (two) times daily. gummie chewable   docusate sodium (COLACE) 100 MG capsule Take 100 mg by mouth daily.   glucose blood (ONETOUCH ULTRA) test strip Check BS up to 4 times daily Dx E11.9   losartan (COZAAR) 50 MG tablet Take 2 tablets (100 mg total) by mouth daily.   meclizine (ANTIVERT) 25 MG tablet Take 1 tablet (25 mg total) by mouth 3 (three) times daily as needed for dizziness.   metoprolol succinate (TOPROL-XL) 100 MG 24 hr tablet Take 1 tablet (100 mg total) by mouth 2 (two) times daily. Take with or immediately following a meal.   sertraline (ZOLOFT) 100 MG tablet Take 2 tablets (200 mg total) by mouth daily.   zolpidem (AMBIEN) 10 MG tablet Take 1 tablet (10 mg total) by mouth at bedtime.   No facility-administered encounter medications on file as of 04/30/2021.    Allergies (verified) Hydrochlorothiazide, Ace inhibitors, Augmentin [amoxicillin-pot clavulanate], and Celecoxib   History: Past Medical History:  Diagnosis Date   Breast cancer ([place]    S/P chemotherapy, radiation, and surgery   Diabetes mellitus without complication ([facility]    Hyperlipidemia    x16 years   Hypertension    x16 years  Past Surgical History:  Procedure Laterality Date   MASTECTOMY     Left   TOTAL KNEE ARTHROPLASTY     Right   Family History  Problem Relation Age of Onset   Alzheimer's disease Father    Diabetes Father    Hypertension Sister    Diabetes Brother    Hypertension Brother    Leukemia Son    Social History   Socioeconomic History   Marital status: Married    Spouse name: Jori Moll   Number of children: 2    Years of education: 12   Highest education level: High school graduate  Occupational History   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  Tobacco Use   Smoking status: Never   Smokeless tobacco: Never  Vaping Use   Vaping Use: Never used  Substance and Sexual Activity   Alcohol use: No   Drug use: No   Sexual activity: Yes    Birth control/protection: Post-menopausal  Other Topics Concern   Not on file  Social History Narrative   Married with 2 children   Social Determinants of Health   Financial Resource Strain: Low Risk    Difficulty of Paying Living Expenses: Not hard at all  Food Insecurity: No Food Insecurity   Worried About Charity fundraiser in the Last Year: Never true   Arboriculturist in the Last Year: Never true  Transportation Needs: No Transportation Needs   Lack of Transportation (Medical): No   Lack of Transportation (Non-Medical): No  Physical Activity: Insufficiently Active   Days of Exercise per Week: 7 days   Minutes of Exercise per Session: 20 min  Stress: No Stress Concern Present   Feeling of Stress : Not at all  Social Connections: Socially Integrated   Frequency of Communication with Friends and Family: More than three times a week   Frequency of Social Gatherings with Friends and Family: More than three times a week   Attends Religious Services: More than 4 times per year   Active Member of [employer] or Organizations: Yes   Attends Music therapist: More than 4 times per year   Marital Status: Married    Tobacco Counseling Counseling given: Not Answered   Clinical Intake:  Pre-visit preparation completed: Yes  Pain : No/denies pain     BMI - recorded: 34.46 Nutritional Status: BMI > 30  Obese Nutritional Risks: None Diabetes: Yes CBG done?: No Did pt. bring in CBG monitor from home?: No  How often do you need to have someone help you when you read instructions, pamphlets, or other  written materials from your doctor or pharmacy?: 1 - Never  Nutrition Risk Assessment:  Has the patient had any N/V/D within the last 2 months?  No  Does the patient have any non-healing wounds?  No  Has the patient had any unintentional weight loss or weight gain?  No   Diabetes:  Is the patient diabetic?  Yes  If diabetic, was a CBG obtained today?  No  Did the patient bring in their glucometer from home?  No  How often do you monitor your CBG's? Once daily fasting - 110 this am per patient.   Financial Strains and Diabetes Management:  Are you having any financial strains with the device, your supplies or your medication? No .  Does the patient want to be seen by Chronic Care Management for management of their diabetes?  No  Would the patient  like to be referred to a Nutritionist or for Diabetic Management?  No   Diabetic Exams:  Diabetic Eye Exam: Completed 12/2020.  Diabetic Foot Exam: Completed 04/25/2020. Pt has been advised about the importance in completing this exam. Pt is scheduled for diabetic foot exam on 10/2021.    Interpreter Needed?: No  Information entered by :: Pearlina Friedly, LPN   Activities of Daily Living In your present state of health, do you have any difficulty performing the following activities: 04/30/2021  Hearing? N  Vision? N  Difficulty concentrating or making decisions? N  Walking or climbing stairs? N  Dressing or bathing? N  Doing errands, shopping? N  Preparing Food and eating ? N  Using the Toilet? N  In the past six months, have you accidently leaked urine? N  Do you have problems with loss of bowel control? N  Managing your Medications? N  Managing your Finances? N  Housekeeping or managing your Housekeeping? N  Some recent data might be hidden    Patient Care Team: Sharion Balloon, FNP as PCP - General (Nurse Practitioner)  Indicate any recent Medical Services you may have received from other than [facility] providers in the past year  (date may be approximate).     Assessment:   This is a routine wellness examination for Naelle.  Hearing/Vision screen Hearing Screening - Comments:: Denies hearing difficulties  Vision Screening - Comments:: Wears eyeglasses - up to date with annual eye exams at [facility] in [place], [place]  Dietary issues and exercise activities discussed: Current Exercise Habits: Home exercise routine, Type of exercise: walking, Time (Minutes): 20, Frequency (Times/Week): 7, Weekly Exercise (Minutes/Week): 140, Intensity: Mild, Exercise limited by: orthopedic condition(s)   Goals Addressed             This Visit's Progress    DIET - INCREASE WATER INTAKE   On track    Prevent falls   On track      Depression Screen PHQ 2/9 Scores 04/30/2021 04/27/2021 01/23/2021 10/26/2020 04/26/2020 04/25/2020 04/22/2019  PHQ - 2 Score 0 0 0 0 0 0 0  PHQ- 9 Score 0 0 - - - 0 -    Fall Risk Fall Risk  04/30/2021 01/23/2021 10/26/2020 04/26/2020 04/25/2020  Falls in the past year? 0 0 0 0 0  Number falls in past yr: 0 - - - -  Injury with Fall? 0 - - - -  Comment - - - - -  Risk for fall due to : Orthopedic patient;Impaired vision;Medication side effect - - - -  Follow up Falls prevention discussed - - - -    FALL RISK PREVENTION PERTAINING TO THE HOME:  Any stairs in or around the home? Yes  If so, are there any without handrails? No  Home free of loose throw rugs in walkways, pet beds, electrical cords, etc? Yes  Adequate lighting in your home to reduce risk of falls? Yes   ASSISTIVE DEVICES UTILIZED TO PREVENT FALLS:  Life alert? No  Use of a cane, walker or w/c? No  Grab bars in the bathroom? Yes  Shower chair or bench in shower? Yes  Elevated toilet seat or a handicapped toilet? Yes   TIMED UP AND GO:  Was the test performed? No . Telephonic visit  Cognitive Function: Normal cognitive status assessed by direct observation by this Nurse Health Advisor. No abnormalities found.       6CIT Screen  04/26/2020 03/02/2019  What Year? 0 points  0 points  What month? 0 points 0 points  What time? 0 points 0 points  Count back from 20 0 points 0 points  Months in reverse 0 points 0 points  Repeat phrase 2 points 0 points  Total Score 2 0    Immunizations Immunization History  Administered Date(s) Administered   Fluad Quad(high Dose 65+) 06/07/2019, 06/23/2020   Hepatitis B 07/28/1998, 08/28/1998, 01/29/1999   Influenza Split 06/16/2018   Influenza Whole 07/23/2012   Influenza, High Dose Seasonal PF 07/11/2017, 06/10/2018   Influenza,inj,Quad PF,6+ Mos 06/23/2013, 07/11/2014, 07/11/2015, 07/10/2016   Moderna Sars-Covid-2 Vaccination 10/09/2019, 11/07/2019, 07/12/2020   Pneumococcal Conjugate-13 05/24/2014   Pneumococcal Polysaccharide-23 06/16/2010   Tdap 02/14/2009, 03/04/2011, 04/27/2021   Zoster, Live 09/16/2005    TDAP status: Up to date  Flu Vaccine status: Up to date  Pneumococcal vaccine status: Up to date  Covid-19 vaccine status: Completed vaccines  Qualifies for Shingles Vaccine? Yes   Zostavax completed Yes   Shingrix Completed?: No.    Education has been provided regarding the importance of this vaccine. Patient has been advised to call insurance company to determine out of pocket expense if they have not yet received this vaccine. Advised may also receive vaccine at local pharmacy or Health Dept. Verbalized acceptance and understanding.  Screening Tests Health Maintenance  Topic Date Due   FOOT EXAM  04/25/2021   COVID-19 Vaccine (4 - Booster for Moderna series) 05/13/2021 (Originally 10/12/2020)   Zoster Vaccines- Shingrix (1 of 2) 07/28/2021 (Originally 03/10/1960)   INFLUENZA VACCINE  12/14/2021 (Originally 04/16/2021)   HEMOGLOBIN A1C  07/26/2021   OPHTHALMOLOGY EXAM  01/03/2022   URINE MICROALBUMIN  04/27/2022   TETANUS/TDAP  04/28/2031   DEXA SCAN  Completed   Hepatitis C Screening  Completed   PNA vac Low Risk Adult  Completed   HPV VACCINES  Aged Out     Health Maintenance  Health Maintenance Due  Topic Date Due   FOOT EXAM  04/25/2021    Colorectal cancer screening: No longer required.   Mammogram status: has appt today  Bone Density status: Completed 05/11/2018. Results reflect: Bone density results: OSTEOPENIA. Repeat every 2 years.  Lung Cancer Screening: (Low Dose CT Chest recommended if Age 20-80 years, 30 pack-year currently smoking OR have quit w/in 15years.) does not qualify.   Additional Screening:  Hepatitis C Screening: does not qualify; Completed 04/25/2020  Vision Screening: Recommended annual ophthalmology exams for early detection of glaucoma and other disorders of the eye. Is the patient up to date with their annual eye exam?  Yes  Who is the provider or what is the name of the office in which the patient attends annual eye exams? [facility] in [place], [place] If pt is not established with a provider, would they like to be referred to a provider to establish care? No .   Dental Screening: Recommended annual dental exams for proper oral hygiene  Community Resource Referral / Chronic Care Management: CRR required this visit?  No   CCM required this visit?  No      Plan:     I have personally reviewed and noted the following in the patient's chart:   Medical and social history Use of alcohol, tobacco or illicit drugs  Current medications and supplements including opioid prescriptions.  Functional ability and status Nutritional status Physical activity Advanced directives List of other physicians Hospitalizations, surgeries, and ER visits in previous 12 months Vitals Screenings to include cognitive, depression, and falls Referrals  and appointments  In addition, I have reviewed and discussed with patient certain preventive protocols, quality metrics, and best practice recommendations. A written personalized care plan for preventive services as well as general preventive health recommendations were  provided to patient.     Sandrea Hammond, LPN   [id number]   Nurse Notes: None

## 2021-05-02 ENCOUNTER — Encounter: Payer: Self-pay | Admitting: Family

## 2021-05-02 LAB — TOXASSURE SELECT 13 (MW), URINE

## 2021-06-05 ENCOUNTER — Other Ambulatory Visit: Payer: Self-pay | Admitting: Family

## 2021-06-05 DIAGNOSIS — I152 Hypertension secondary to endocrine disorders: Secondary | ICD-10-CM

## 2021-06-11 ENCOUNTER — Other Ambulatory Visit: Payer: Self-pay | Admitting: Family

## 2021-06-11 DIAGNOSIS — E1169 Type 2 diabetes mellitus with other specified complication: Secondary | ICD-10-CM

## 2021-06-11 DIAGNOSIS — E785 Hyperlipidemia, unspecified: Secondary | ICD-10-CM

## 2021-06-25 ENCOUNTER — Other Ambulatory Visit: Payer: Self-pay

## 2021-06-25 ENCOUNTER — Ambulatory Visit (INDEPENDENT_AMBULATORY_CARE_PROVIDER_SITE_OTHER): Payer: Medicare Other

## 2021-06-25 DIAGNOSIS — Z23 Encounter for immunization: Secondary | ICD-10-CM | POA: Diagnosis not present

## 2021-07-27 ENCOUNTER — Ambulatory Visit: Payer: Medicare Other | Admitting: Family

## 2021-08-07 ENCOUNTER — Other Ambulatory Visit: Payer: Self-pay | Admitting: Family

## 2021-08-07 DIAGNOSIS — I152 Hypertension secondary to endocrine disorders: Secondary | ICD-10-CM

## 2021-08-07 DIAGNOSIS — E1159 Type 2 diabetes mellitus with other circulatory complications: Secondary | ICD-10-CM

## 2021-09-04 ENCOUNTER — Other Ambulatory Visit: Payer: Self-pay | Admitting: Family

## 2021-09-04 DIAGNOSIS — E1159 Type 2 diabetes mellitus with other circulatory complications: Secondary | ICD-10-CM

## 2021-09-24 ENCOUNTER — Other Ambulatory Visit: Payer: Self-pay | Admitting: Family

## 2021-09-24 DIAGNOSIS — F411 Generalized anxiety disorder: Secondary | ICD-10-CM

## 2021-09-25 ENCOUNTER — Other Ambulatory Visit: Payer: Self-pay | Admitting: Family

## 2021-09-25 DIAGNOSIS — F411 Generalized anxiety disorder: Secondary | ICD-10-CM

## 2021-10-29 ENCOUNTER — Encounter: Payer: Self-pay | Admitting: Family

## 2021-10-29 ENCOUNTER — Ambulatory Visit (INDEPENDENT_AMBULATORY_CARE_PROVIDER_SITE_OTHER): Payer: Medicare PPO | Admitting: Family

## 2021-10-29 VITALS — BP 119/78 | HR 63 | Temp 98.1°F | Ht 67.0 in | Wt 216.0 lb

## 2021-10-29 DIAGNOSIS — Z79899 Other long term (current) drug therapy: Secondary | ICD-10-CM

## 2021-10-29 DIAGNOSIS — G4700 Insomnia, unspecified: Secondary | ICD-10-CM

## 2021-10-29 DIAGNOSIS — E1169 Type 2 diabetes mellitus with other specified complication: Secondary | ICD-10-CM | POA: Diagnosis not present

## 2021-10-29 DIAGNOSIS — Z0000 Encounter for general adult medical examination without abnormal findings: Secondary | ICD-10-CM

## 2021-10-29 DIAGNOSIS — M8580 Other specified disorders of bone density and structure, unspecified site: Secondary | ICD-10-CM

## 2021-10-29 DIAGNOSIS — E669 Obesity, unspecified: Secondary | ICD-10-CM

## 2021-10-29 DIAGNOSIS — Z0001 Encounter for general adult medical examination with abnormal findings: Secondary | ICD-10-CM | POA: Diagnosis not present

## 2021-10-29 DIAGNOSIS — E559 Vitamin D deficiency, unspecified: Secondary | ICD-10-CM

## 2021-10-29 DIAGNOSIS — F411 Generalized anxiety disorder: Secondary | ICD-10-CM

## 2021-10-29 DIAGNOSIS — E1159 Type 2 diabetes mellitus with other circulatory complications: Secondary | ICD-10-CM | POA: Diagnosis not present

## 2021-10-29 DIAGNOSIS — Z23 Encounter for immunization: Secondary | ICD-10-CM

## 2021-10-29 DIAGNOSIS — I152 Hypertension secondary to endocrine disorders: Secondary | ICD-10-CM

## 2021-10-29 DIAGNOSIS — E785 Hyperlipidemia, unspecified: Secondary | ICD-10-CM

## 2021-10-29 DIAGNOSIS — K5900 Constipation, unspecified: Secondary | ICD-10-CM

## 2021-10-29 LAB — LIPID PANEL

## 2021-10-29 LAB — BAYER DCA HB A1C WAIVED: HB A1C (BAYER DCA - WAIVED): 5.8 % — ABNORMAL HIGH (ref 4.8–5.6)

## 2021-10-29 MED ORDER — ZOLPIDEM TARTRATE 10 MG PO TABS: 10.0000 mg | ORAL_TABLET | Freq: Every day | ORAL | 5 refills | Status: DC

## 2021-10-29 NOTE — Progress Notes (Signed)
Subjective:    Patient ID: Jeanette Daniels, female    DOB: 1941-05-10, 81 y.o.   MRN: [id number]  Chief Complaint  Patient presents with   Medical Management of Chronic Issues   Pt calls  the office today for CPE. She is retired now and enjoying life.    She has hx of left breast cancer in 2006 and is followed by Oncologists annually.   She has osteopenia, her last dexa scan was 05/11/18. Hypertension This is a chronic problem. The current episode started more than 1 year ago. The problem has been resolved since onset. Associated symptoms include anxiety. Pertinent negatives include no blurred vision, malaise/fatigue, peripheral edema or shortness of breath. Risk factors for coronary artery disease include diabetes mellitus, dyslipidemia, obesity and sedentary lifestyle. The current treatment provides moderate improvement.  Hyperlipidemia This is a chronic problem. The current episode started more than 1 year ago. The problem is controlled. Recent lipid tests were reviewed and are normal. Exacerbating diseases include obesity. Pertinent negatives include no shortness of breath. Current antihyperlipidemic treatment includes statins. The current treatment provides moderate improvement of lipids. Risk factors for coronary artery disease include dyslipidemia, diabetes mellitus, hypertension and post-menopausal.  Diabetes She presents for her follow-up diabetic visit. She has type 2 diabetes mellitus. Hypoglycemia symptoms include nervousness/anxiousness. Pertinent negatives for diabetes include no blurred vision and no foot paresthesias. Symptoms are stable. Risk factors for coronary artery disease include dyslipidemia, diabetes mellitus, hypertension and sedentary lifestyle.  Insomnia Primary symptoms: difficulty falling asleep, frequent awakening, no malaise/fatigue.   The current episode started more than one year. The onset quality is gradual.  Anxiety Presents for follow-up visit. Symptoms  include excessive worry, insomnia, irritability and nervous/anxious behavior. Patient reports no shortness of breath. Symptoms occur occasionally. The severity of symptoms is moderate.    Constipation This is a chronic problem. The current episode started more than 1 year ago. The problem has been waxing and waning since onset. Risk factors include obesity. The treatment provided moderate relief.     Review of Systems  Constitutional:  Positive for irritability. Negative for malaise/fatigue.  Eyes:  Negative for blurred vision.  Respiratory:  Negative for shortness of breath.   Gastrointestinal:  Positive for constipation.  Psychiatric/Behavioral:  The patient is nervous/anxious and has insomnia.   All other systems reviewed and are negative.  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  Tobacco Use   Smoking status: Never   Smokeless tobacco: Never  Vaping Use   Vaping Use: Never used  Substance and Sexual Activity   Alcohol use: No   Drug use: No   Sexual activity: Yes    Birth control/protection: Post-menopausal  Other Topics Concern   Not on file  Social History Narrative   Married with 2 children   Social Determinants of Health   Financial Resource Strain: Low Risk    Difficulty of Paying Living Expenses: Not hard at all  Food Insecurity: No Food Insecurity   Worried  About Running Out of Food in the Last Year: Never true   Ran Out of Food in the Last Year: Never true  Transportation Needs: No Transportation Needs   Lack of Transportation  (Medical): No   Lack of Transportation (Non-Medical): No  Physical Activity: Insufficiently Active   Days of Exercise per Week: 7 days   Minutes of Exercise per Session: 20 min  Stress: No Stress Concern Present   Feeling of Stress : Not at all  Social Connections: Socially Integrated   Frequency of Communication with Friends and Family: More than three times a week   Frequency of Social Gatherings with Friends and Family: More than three times a week   Attends Religious Services: More than 4 times per year   Active Member of [employer] or Organizations: Yes   Attends Music therapist: More than 4 times per year   Marital Status: Married       Objective:   Physical Exam Vitals reviewed.  Constitutional:      General: She is not in acute distress.    Appearance: She is well-developed. She is obese.  HENT:     Head: Normocephalic and atraumatic.     Right Ear: Tympanic membrane normal.     Left Ear: Tympanic membrane normal.  Eyes:     Pupils: Pupils are equal, round, and reactive to light.  Neck:     Thyroid: No thyromegaly.  Cardiovascular:     Rate and Rhythm: Normal rate and regular rhythm.     Heart sounds: Normal heart sounds. No murmur heard. Pulmonary:     Effort: Pulmonary effort is normal. No respiratory distress.     Breath sounds: Normal breath sounds. No wheezing.  Abdominal:     General: Bowel sounds are normal. There is no distension.     Palpations: Abdomen is soft.     Tenderness: There is no abdominal tenderness.  Musculoskeletal:        General: No tenderness. Normal range of motion.     Cervical back: Normal range of motion and neck supple.  Skin:    General: Skin is warm and dry.  Neurological:     Mental Status: She is alert and oriented to person, place, and time.     Cranial Nerves: No cranial nerve deficit.     Deep Tendon Reflexes: Reflexes are normal and symmetric.  Psychiatric:        Behavior: Behavior normal.        Thought  Content: Thought content normal.        Judgment: Judgment normal.      BP 119/78    Pulse 63    Temp 98.1 F (36.7 C) (Temporal)    Ht _0  (1.702 m)    Wt 216 lb (98 kg)    BMI 33.83 kg/m      Assessment & Plan:  CALYNN FERRERO comes in today with chief complaint of Medical Management of Chronic Issues   Diagnosis and orders addressed:  1. Insomnia, unspecified type - zolpidem (AMBIEN) 10 MG tablet; Take 1 tablet (10 mg total) by mouth at bedtime.  Dispense: 30 tablet; Refill: 5 - CMP14+EGFR - CBC with Differential/Platelet  2. Hypertension associated with diabetes ([place]) - CMP14+EGFR - CBC with Differential/Platelet  3. Type 2 diabetes mellitus with other specified complication, without long-term current use of insulin (HCC) - CMP14+EGFR - CBC with Differential/Platelet - Bayer DCA Hb A1c Waived  4. Hyperlipidemia associated with type 2 diabetes mellitus (  [place]) - CMP14+EGFR - CBC with Differential/Platelet  5. Constipation, unspecified constipation type - CMP14+EGFR - CBC with Differential/Platelet  6. Controlled substance agreement signed - CMP14+EGFR - CBC with Differential/Platelet  7. GAD (generalized anxiety disorder) - CMP14+EGFR - CBC with Differential/Platelet  8. Obesity (BMI 30-39.9) - CMP14+EGFR - CBC with Differential/Platelet  9. Vitamin D deficiency  - CMP14+EGFR - CBC with Differential/Platelet - VITAMIN D 25 Hydroxy (Vit-D Deficiency, Fractures)  10. Osteopenia, unspecified location - CMP14+EGFR - CBC with Differential/Platelet - VITAMIN D 25 Hydroxy (Vit-D Deficiency, Fractures)  11. Annual physical exam  - CMP14+EGFR - CBC with Differential/Platelet - Bayer DCA Hb A1c Waived - Lipid panel - TSH - VITAMIN D 25 Hydroxy (Vit-D Deficiency, Fractures)   Labs pending Patient reviewed in [place] controlled database, no flags noted. Contract and drug screen are up to date.  Health Maintenance reviewed Diet and exercise  encouraged  Follow up plan: 6 months    Evelina Dun, FNP

## 2021-10-29 NOTE — Patient Instructions (Signed)
Cholecystitis Cholecystitis is inflammation of the gallbladder. Cholecystitis is often called a gallbladder attack. The gallbladder is a pear-shaped organ that lies beneath the liver on the right side of the body. The gallbladder stores a fluid that helps the body digest fats (bile). If bile builds up in your gallbladder, your gallbladder becomes inflamed and can develop a serious infection. This condition may occur suddenly. Cholecystitis is a serious condition and requires treatment. What are the causes? The most common cause of this condition is gallstones. Gallstones can block the tube (duct) that carries bile out of your gallbladder. This causes bile to build up. Other causes include: Damage to the gallbladder due to decreased blood flow. Infection in the bile duct. Scars, kinks, or adhesions in the bile duct. Tumors in the liver, pancreas, or gallbladder. What increases the risk? You are more likely to develop this condition if: You are female and between the ages of 73-62. You take birth control pills or use estrogen. You take certain medicines that increase your likelihood of developing gallstones. You are obese. You have a severe reaction to an infection (sepsis). The infection may be bacterial, fungal, parasitic, or viral. You have been hospitalized due to trauma, such as a burn or critical illness. You have not eaten or drank for a long period of time (prolonged fasting). What are the signs or symptoms? Symptoms of this condition include: Tenderness in the upper right part of the abdomen. A lump over the gallbladder. Bloating in the abdomen. Nausea. Vomiting. Fever. Chills. How is this diagnosed? This condition is diagnosed with a medical history and physical exam. You may also have other tests, including: Imaging tests, such as: An ultrasound of the abdomen. A CT scan of the abdomen. A gallbladder nuclear scan (HIDA cholescintigraphy). This allows your health care  provider to see the bile moving from your liver to your gallbladder and to your small intestine. MRI. Blood tests, such as: A complete blood count. The white blood cell count may be higher than normal. C-reactive protein (CRP) test. The level of CRP will be higher if there is an infection. Liver function tests. Certain types of gallstones cause some results to be higher than normal. How is this treated? Treatment may include: Pain medicine and IV fluids. Not eating or drinking (fasting). This helps to take stress off of your gallbladder. Antibiotic medicine. This is usually given through an IV. Surgery to remove your gallbladder (cholecystectomy). Gallbladder drainage. In this procedure, a tube is placed into the gallbladder to drain fluid. This may be done for people with moderate to severe cholecystitis who cannot have surgery. Follow these instructions at home: Medicines  Take over-the-counter and prescription medicines only as told by your health care provider. If you were prescribed an antibiotic medicine, take it as told by your health care provider. Do not stop taking the antibiotic even if you start to feel better. General instructions Follow instructions from your health care provider about what to eat or drink. When you are allowed to eat, avoid eating or drinking anything that triggers your symptoms. Do not use any products that contain nicotine or tobacco. These products include cigarettes, chewing tobacco, and vaping devices, such as e-cigarettes. If you need help quitting, ask your health care provider. Keep all follow-up visits. This is important. Contact a health care provider if: Your pain is not controlled with medicine. You have a fever. Get help right away if: Your pain moves to another part of your abdomen or to your  back. You continue to have symptoms or you develop new symptoms even with treatment. These symptoms may be an emergency. Get help right away. Call  911. Do not wait to see if the symptoms will go away. Do not drive yourself to the hospital. Summary Cholecystitis is inflammation of the gallbladder. The most common cause of this condition is gallstones. Gallstones can block the tube (duct) that carries bile out of your gallbladder. Common symptoms include tenderness in the abdomen, nausea, vomiting, fever, and chills. This condition is treated with fasting, pain medicine, surgery to remove the gallbladder, antibiotic medicines, and gallbladder drainage. Follow your health care provider's instructions for eating and drinking. Avoid eating anything that triggers your symptoms. This information is not intended to replace advice given to you by your health care provider. Make sure you discuss any questions you have with your health care provider. Document Revised: 03/06/2021 Document Reviewed: 03/06/2021 Elsevier Patient Education  [place].

## 2021-10-30 ENCOUNTER — Other Ambulatory Visit: Payer: Self-pay | Admitting: Family

## 2021-10-30 LAB — CBC WITH DIFFERENTIAL/PLATELET
Basophils Absolute: 0 10*3/uL (ref 0.0–0.2)
Basos: 0 %
EOS (ABSOLUTE): 0.1 10*3/uL (ref 0.0–0.4)
Eos: 2 %
Hematocrit: 41.1 % (ref 34.0–46.6)
Hemoglobin: 13.3 g/dL (ref 11.1–15.9)
Immature Grans (Abs): 0 10*3/uL (ref 0.0–0.1)
Immature Granulocytes: 0 %
Lymphocytes Absolute: 1.4 10*3/uL (ref 0.7–3.1)
Lymphs: 21 %
MCH: 29.5 pg (ref 26.6–33.0)
MCHC: 32.4 g/dL (ref 31.5–35.7)
MCV: 91 fL (ref 79–97)
Monocytes Absolute: 0.4 10*3/uL (ref 0.1–0.9)
Monocytes: 7 %
Neutrophils Absolute: 4.7 10*3/uL (ref 1.4–7.0)
Neutrophils: 70 %
Platelets: 200 10*3/uL (ref 150–450)
RBC: 4.51 x10E6/uL (ref 3.77–5.28)
RDW: 12.8 % (ref 11.7–15.4)
WBC: 6.7 10*3/uL (ref 3.4–10.8)

## 2021-10-30 LAB — LIPID PANEL
Chol/HDL Ratio: 3.1 ratio (ref 0.0–4.4)
Cholesterol, Total: 183 mg/dL (ref 100–199)
HDL: 60 mg/dL (ref >39)
LDL Chol Calc (NIH): 105 mg/dL — ABNORMAL HIGH (ref 0–99)
Triglycerides: 97 mg/dL (ref 0–149)
VLDL Cholesterol Cal: 18 mg/dL (ref 5–40)

## 2021-10-30 LAB — CMP14+EGFR
ALT: 11 IU/L (ref 0–32)
AST: 15 IU/L (ref 0–40)
Albumin/Globulin Ratio: 1.7 (ref 1.2–2.2)
Albumin: 4.2 g/dL (ref 3.7–4.7)
Alkaline Phosphatase: 85 IU/L (ref 44–121)
BUN/Creatinine Ratio: 18 (ref 12–28)
BUN: 15 mg/dL (ref 8–27)
Bilirubin Total: 0.4 mg/dL (ref 0.0–1.2)
CO2: 26 mmol/L (ref 20–29)
Calcium: 9.7 mg/dL (ref 8.7–10.3)
Chloride: 104 mmol/L (ref 96–106)
Creatinine, Ser: 0.84 mg/dL (ref 0.57–1.00)
Globulin, Total: 2.5 g/dL (ref 1.5–4.5)
Glucose: 135 mg/dL — ABNORMAL HIGH (ref 70–99)
Potassium: 5.3 mmol/L — ABNORMAL HIGH (ref 3.5–5.2)
Sodium: 141 mmol/L (ref 134–144)
Total Protein: 6.7 g/dL (ref 6.0–8.5)
eGFR: 71 mL/min/{1.73_m2} (ref >59)

## 2021-10-30 LAB — VITAMIN D 25 HYDROXY (VIT D DEFICIENCY, FRACTURES): Vit D, 25-Hydroxy: 28.2 ng/mL — ABNORMAL LOW (ref 30.0–100.0)

## 2021-10-30 LAB — TSH: TSH: 3.63 u[IU]/mL (ref 0.450–4.500)

## 2021-10-30 MED ORDER — VITAMIN D (ERGOCALCIFEROL) 1.25 MG (50000 UNIT) PO CAPS: 50000.0000 [IU] | ORAL_CAPSULE | ORAL | 3 refills | Status: AC

## 2021-11-05 ENCOUNTER — Other Ambulatory Visit: Payer: Self-pay | Admitting: Family

## 2021-11-05 DIAGNOSIS — I152 Hypertension secondary to endocrine disorders: Secondary | ICD-10-CM

## 2021-11-05 DIAGNOSIS — E1159 Type 2 diabetes mellitus with other circulatory complications: Secondary | ICD-10-CM

## 2021-11-06 ENCOUNTER — Telehealth: Payer: Self-pay | Admitting: Family

## 2021-11-06 NOTE — Telephone Encounter (Signed)
Patient has questions about her lab work. Please call back.

## 2021-11-06 NOTE — Telephone Encounter (Signed)
Patient aware and verbalized understanding. °

## 2021-12-10 ENCOUNTER — Other Ambulatory Visit: Payer: Self-pay | Admitting: Family

## 2021-12-10 DIAGNOSIS — E1169 Type 2 diabetes mellitus with other specified complication: Secondary | ICD-10-CM

## 2021-12-10 DIAGNOSIS — E1159 Type 2 diabetes mellitus with other circulatory complications: Secondary | ICD-10-CM

## 2021-12-24 ENCOUNTER — Other Ambulatory Visit: Payer: Self-pay | Admitting: Family

## 2021-12-24 DIAGNOSIS — F411 Generalized anxiety disorder: Secondary | ICD-10-CM

## 2021-12-25 ENCOUNTER — Telehealth: Payer: Self-pay | Admitting: Family

## 2021-12-25 NOTE — Telephone Encounter (Signed)
She needs to be seen in person for evaluation  ?

## 2021-12-25 NOTE — Telephone Encounter (Signed)
Patient is having issues with vertigo, she already takes meclizine to help with it but she said that she has felt dizzy for a few days and it usually does not last this long. She would like to know if there is anything she can do to help. Offered appt and she did not want one at this time. Please call back and advise.  ?

## 2021-12-25 NOTE — Telephone Encounter (Signed)
Pt made aware that she needs to be seen. She would like to wait until Jeanette Daniels returns next week. Appt scheduled for 4/20 at 11:55. Pt does not drive and has Meclizine. ?

## 2022-01-03 ENCOUNTER — Ambulatory Visit: Payer: Medicare PPO | Admitting: Family

## 2022-01-03 ENCOUNTER — Encounter: Payer: Self-pay | Admitting: Family

## 2022-01-03 VITALS — BP 144/71 | HR 57 | Temp 97.6°F | Ht 67.0 in | Wt 216.0 lb

## 2022-01-03 DIAGNOSIS — R42 Dizziness and giddiness: Secondary | ICD-10-CM

## 2022-01-03 DIAGNOSIS — E1169 Type 2 diabetes mellitus with other specified complication: Secondary | ICD-10-CM | POA: Diagnosis not present

## 2022-01-03 DIAGNOSIS — N3281 Overactive bladder: Secondary | ICD-10-CM | POA: Diagnosis not present

## 2022-01-03 MED ORDER — MECLIZINE HCL 25 MG PO TABS: 25.0000 mg | ORAL_TABLET | Freq: Three times a day (TID) | ORAL | 1 refills | Status: DC | PRN

## 2022-01-03 MED ORDER — OXYBUTYNIN CHLORIDE ER 10 MG PO TB24
10.0000 mg | ORAL_TABLET | Freq: Every day | ORAL | 1 refills | Status: DC
Start: 2022-01-03 — End: 2022-07-09

## 2022-01-03 NOTE — Progress Notes (Signed)
? ?Subjective:  ? ? Patient ID: Jeanette Daniels, female    DOB: 10/25/1940, 81 y.o.   MRN: [id number] ? ?Chief Complaint  ?Patient presents with  ? Dizziness  ? ?PT presents to the office today with intermittent dizziness. States raising her head up makes it worse.  ?Dizziness ?This is a new problem. The current episode started 1 to 4 weeks ago. The problem occurs intermittently. The problem has been waxing and waning. Associated symptoms include nausea and vertigo. Pertinent negatives include no chills, congestion, coughing or vomiting. The symptoms are aggravated by bending and twisting. She has tried rest for the symptoms. The treatment provided moderate relief.  ?Diabetes ?She presents for her follow-up diabetic visit. She has type 2 diabetes mellitus. Hypoglycemia symptoms include dizziness. Pertinent negatives for diabetes include no blurred vision and no foot paresthesias. Risk factors for coronary artery disease include dyslipidemia, diabetes mellitus, hypertension and sedentary lifestyle.  ?Urinary Frequency  ?This is a chronic problem. The current episode started more than 1 year ago. The problem occurs intermittently. Associated symptoms include frequency and nausea. Pertinent negatives include no chills or vomiting.  ? ? ? ?Review of Systems  ?Constitutional:  Negative for chills.  ?HENT:  Negative for congestion.   ?Eyes:  Negative for blurred vision.  ?Respiratory:  Negative for cough.   ?Gastrointestinal:  Positive for nausea. Negative for vomiting.  ?Genitourinary:  Positive for frequency.  ?Neurological:  Positive for dizziness and vertigo.  ?All other systems reviewed and are negative. ? ?   ?Objective:  ? Physical Exam ?Vitals reviewed.  ?Constitutional:   ?   General: She is not in acute distress. ?   Appearance: She is well-developed.  ?HENT:  ?   Head: Normocephalic and atraumatic.  ?   Right Ear: Tympanic membrane normal.  ?   Left Ear: Tympanic membrane normal.  ?Eyes:  ?   Pupils: Pupils are  equal, round, and reactive to light.  ?Neck:  ?   Thyroid: No thyromegaly.  ?Cardiovascular:  ?   Rate and Rhythm: Normal rate and regular rhythm.  ?   Heart sounds: Normal heart sounds. No murmur heard. ?Pulmonary:  ?   Effort: Pulmonary effort is normal. No respiratory distress.  ?   Breath sounds: Normal breath sounds. No wheezing.  ?Abdominal:  ?   General: Bowel sounds are normal. There is no distension.  ?   Palpations: Abdomen is soft.  ?   Tenderness: There is no abdominal tenderness.  ?Musculoskeletal:     ?   General: No tenderness. Normal range of motion.  ?   Cervical back: Normal range of motion and neck supple.  ?Skin: ?   General: Skin is warm and dry.  ?Neurological:  ?   Mental Status: She is alert and oriented to person, place, and time.  ?   Cranial Nerves: No cranial nerve deficit.  ?   Deep Tendon Reflexes: Reflexes are normal and symmetric.  ?Psychiatric:     ?   Behavior: Behavior normal.     ?   Thought Content: Thought content normal.     ?   Judgment: Judgment normal.  ? ? ? ? ?BP (!) 144/71   Pulse (!) 57   Temp 97.6 ?F (36.4 ?C) (Temporal)   Ht 5' 7" (1.702 m)   Wt 216 lb (98 kg)   BMI 33.83 kg/m?  ? ?   ?Assessment & Plan:  ?[name] comes in today with chief complaint of Dizziness ? ? ?  Diagnosis and orders addressed: ? ?1. Dizziness ?Epley exercise  ?Fall precautions discussed  ?- meclizine (ANTIVERT) 25 MG tablet; Take 1 tablet (25 mg total) by mouth 3 (three) times daily as needed for dizziness.  Dispense: 30 tablet; Refill: 1 ?- Anemia Profile B ?- BMP8+EGFR ? ?2. Overactive bladder ?Oxybutynin  ?- BMP8+EGFR ?- oxybutynin (DITROPAN XL) 10 MG 24 hr tablet; Take 1 tablet (10 mg total) by mouth at bedtime.  Dispense: 90 tablet; Refill: 1 ? ?3. Type 2 diabetes mellitus with other specified complication, without long-term current use of insulin (HCC) ?- BMP8+EGFR ? ? ?Labs pending ?Health Maintenance reviewed ?Diet and exercise encouraged ? ?Follow up plan: ?3  months  ? ?Christy  Hawks, FNP ? ? ?

## 2022-01-03 NOTE — Patient Instructions (Signed)
Benign Positional Vertigo Vertigo is the feeling that you or your surroundings are moving when they are not. Benign positional vertigo is the most common form of vertigo. This is usually a harmless condition (benign). This condition is positional. This means that symptoms are triggered by certain movements and positions. This condition can be dangerous if it occurs while you are doing something that could cause harm to yourself or others. This includes activities such as driving or operating machinery. What are the causes? The inner ear has fluid-filled canals that help your brain sense movement and balance. When the fluid moves, the brain receives messages about your body's position. With benign positional vertigo, calcium crystals in the inner ear break free and disturb the inner ear area. This causes your brain to receive confusing messages about your body's position. What increases the risk? You are more likely to develop this condition if: You are a woman. You are 50 years of age or older. You have recently had a head injury. You have an inner ear disease. What are the signs or symptoms? Symptoms of this condition usually happen when you move your head or your eyes in different directions. Symptoms may start suddenly and usually last for less than a minute. They include: Loss of balance and falling. Feeling like you are spinning or moving. Feeling like your surroundings are spinning or moving. Nausea and vomiting. Blurred vision. Dizziness. Involuntary eye movement (nystagmus). Symptoms can be mild and cause only minor problems, or they can be severe and interfere with daily life. Episodes of benign positional vertigo may return (recur) over time. Symptoms may also improve over time. How is this diagnosed? This condition may be diagnosed based on: Your medical history. A physical exam of the head, neck, and ears. Positional tests to check for or stimulate vertigo. You may be asked to  turn your head and change positions, such as going from sitting to lying down. A health care provider will watch for symptoms of vertigo. You may be referred to a health care provider who specializes in ear, nose, and throat problems (ENT or otolaryngologist) or a provider who specializes in disorders of the nervous system (neurologist). How is this treated?  This condition may be treated in a session in which your health care provider moves your head in specific positions to help the displaced crystals in your inner ear move. Treatment for this condition may take several sessions. Surgery may be needed in severe cases, but this is rare. In some cases, benign positional vertigo may resolve on its own in 2-4 weeks. Follow these instructions at home: Safety Move slowly. Avoid sudden body or head movements or certain positions, as told by your health care provider. Avoid driving or operating machinery until your health care provider says it is safe. Avoid doing any tasks that would be dangerous to you or others if vertigo occurs. If you have trouble walking or keeping your balance, try using a cane for stability. If you feel dizzy or unstable, sit down right away. Return to your normal activities as told by your health care provider. Ask your health care provider what activities are safe for you. General instructions Take over-the-counter and prescription medicines only as told by your health care provider. Drink enough fluid to keep your urine pale yellow. Keep all follow-up visits. This is important. Contact a health care provider if: You have a fever. Your condition gets worse or you develop new symptoms. Your family or friends notice any behavioral changes.   You have nausea or vomiting that gets worse. You have numbness or a prickling and tingling sensation. Get help right away if you: Have difficulty speaking or moving. Are always dizzy or faint. Develop severe headaches. Have weakness in  your legs or arms. Have changes in your hearing or vision. Develop a stiff neck. Develop sensitivity to light. These symptoms may represent a serious problem that is an emergency. Do not wait to see if the symptoms will go away. Get medical help right away. Call your local emergency services (911 in the [place] Do not drive yourself to the hospital. Summary Vertigo is the feeling that you or your surroundings are moving when they are not. Benign positional vertigo is the most common form of vertigo. This condition is caused by calcium crystals in the inner ear that become displaced. This causes a disturbance in an area of the inner ear that helps your brain sense movement and balance. Symptoms include loss of balance and falling, feeling that you or your surroundings are moving, nausea and vomiting, and blurred vision. This condition can be diagnosed based on symptoms, a physical exam, and positional tests. Follow safety instructions as told by your health care provider and keep all follow-up visits. This is important. This information is not intended to replace advice given to you by your health care provider. Make sure you discuss any questions you have with your health care provider. Document Revised: 08/02/2020 Document Reviewed: 08/02/2020 Elsevier Patient Education  2023 [employer]  

## 2022-01-04 LAB — BMP8+EGFR
BUN/Creatinine Ratio: 17 (ref 12–28)
BUN: 13 mg/dL (ref 8–27)
CO2: 24 mmol/L (ref 20–29)
Calcium: 10 mg/dL (ref 8.7–10.3)
Chloride: 106 mmol/L (ref 96–106)
Creatinine, Ser: 0.76 mg/dL (ref 0.57–1.00)
Glucose: 116 mg/dL — ABNORMAL HIGH (ref 70–99)
Potassium: 5.1 mmol/L (ref 3.5–5.2)
Sodium: 145 mmol/L — ABNORMAL HIGH (ref 134–144)
eGFR: 80 mL/min/{1.73_m2} (ref >59)

## 2022-01-04 LAB — ANEMIA PROFILE B
Basophils Absolute: 0 10*3/uL (ref 0.0–0.2)
Basos: 0 %
EOS (ABSOLUTE): 0.1 10*3/uL (ref 0.0–0.4)
Eos: 1 %
Ferritin: 40 ng/mL (ref 15–150)
Folate: 6.4 ng/mL (ref >3.0)
Hematocrit: 39.7 % (ref 34.0–46.6)
Hemoglobin: 13.3 g/dL (ref 11.1–15.9)
Immature Grans (Abs): 0 10*3/uL (ref 0.0–0.1)
Immature Granulocytes: 0 %
Iron Saturation: 28 % (ref 15–55)
Iron: 82 ug/dL (ref 27–139)
Lymphocytes Absolute: 1.8 10*3/uL (ref 0.7–3.1)
Lymphs: 25 %
MCH: 30 pg (ref 26.6–33.0)
MCHC: 33.5 g/dL (ref 31.5–35.7)
MCV: 90 fL (ref 79–97)
Monocytes Absolute: 0.4 10*3/uL (ref 0.1–0.9)
Monocytes: 5 %
Neutrophils Absolute: 4.9 10*3/uL (ref 1.4–7.0)
Neutrophils: 69 %
Platelets: 196 10*3/uL (ref 150–450)
RBC: 4.43 x10E6/uL (ref 3.77–5.28)
RDW: 12.3 % (ref 11.7–15.4)
Retic Ct Pct: 1.2 % (ref 0.6–2.6)
Total Iron Binding Capacity: 292 ug/dL (ref 250–450)
UIBC: 210 ug/dL (ref 118–369)
Vitamin B-12: 257 pg/mL (ref 232–1245)
WBC: 7.2 10*3/uL (ref 3.4–10.8)

## 2022-01-21 ENCOUNTER — Ambulatory Visit: Payer: Medicare PPO | Admitting: Family

## 2022-01-21 ENCOUNTER — Encounter: Payer: Self-pay | Admitting: Family

## 2022-01-21 DIAGNOSIS — R42 Dizziness and giddiness: Secondary | ICD-10-CM

## 2022-01-21 NOTE — Progress Notes (Signed)
? ?Virtual Visit  Note ?Due to COVID-19 pandemic this visit was conducted virtually. This visit type was conducted due to national recommendations for restrictions regarding the COVID-19 Pandemic (e.g. social distancing, sheltering in place) in an effort to limit this patient's exposure and mitigate transmission in our community. All issues noted in this document were discussed and addressed.  A physical exam was not performed with this format. ? ?I connected with Jeanette Daniels on 01/21/22 at 12:33 pm  by telephone and verified that I am speaking with the correct person using two identifiers. Jeanette Daniels is currently located at home  and no one is currently with her during visit. The provider, Evelina Dun, FNP is located in their office at time of visit. ? ?I discussed the limitations, risks, security and privacy concerns of performing an evaluation and management service by telephone and the availability of in person appointments. I also discussed with the patient that there may be a patient responsible charge related to this service. The patient expressed understanding and agreed to proceed. ? ?[name],you are scheduled for a virtual visit with your provider today.   ? ?Just as we do with appointments in the office, we must obtain your consent to participate.  Your consent will be active for this visit and any virtual visit you may have with one of our providers in the next 365 days.   ? ?If you have a MyChart account, I can also send a copy of this consent to you electronically.  All virtual visits are billed to your insurance company just like a traditional visit in the office.  As this is a virtual visit, video technology does not allow for your provider to perform a traditional examination.  This may limit your provider's ability to fully assess your condition.  If your provider identifies any concerns that need to be evaluated in person or the need to arrange testing such as labs, EKG, etc, we will make  arrangements to do so.   ? ?Although advances in technology are sophisticated, we cannot ensure that it will always work on either your end or our end.  If the connection with a video visit is poor, we may have to switch to a telephone visit.  With either a video or telephone visit, we are not always able to ensure that we have a secure connection.   I need to obtain your verbal consent now.   Are you willing to proceed with your visit today?  ? ?[name] has provided verbal consent on 01/21/2022 for a virtual visit (video or telephone). ? ? ?Evelina Dun, FNP ?01/21/2022  12:34 PM ? ? ? ?History and Present Illness: ? ?Pt calls the office today with intermittent vertigo. Was seen in the office on 01/03/22 and was given antivert and Epley exercises. States her dizziness improved then return yesterday. However, this morning it is better.  ?Dizziness ?This is a recurrent problem. The current episode started 1 to 4 weeks ago. The problem occurs intermittently. The problem has been waxing and waning. Associated symptoms include nausea and vertigo. Pertinent negatives include no anorexia, arthralgias, chest pain, congestion, coughing, headaches or sore throat. She has tried rest for the symptoms. The treatment provided mild relief.  ? ? ? ?Review of Systems  ?HENT:  Negative for congestion and sore throat.   ?Respiratory:  Negative for cough.   ?Cardiovascular:  Negative for chest pain.  ?Gastrointestinal:  Positive for nausea. Negative for anorexia.  ?Musculoskeletal:  Negative for arthralgias.  ?Neurological:  Positive for dizziness and vertigo. Negative for headaches.  ? ? ?Observations/Objective: ?No SOB or distress noted ? ?Assessment and Plan: ?1. Vertigo ?Continue antivert ?Continue Epley exercises  ?Fall precautions  ?Referral to ENT  ?- Ambulatory referral to ENT ? ? ?  ?I discussed the assessment and treatment plan with the patient. The patient was provided an opportunity to ask questions and all were  answered. The patient agreed with the plan and demonstrated an understanding of the instructions. ?  ?The patient was advised to call back or seek an in-person evaluation if the symptoms worsen or if the condition fails to improve as anticipated. ? ?The above assessment and management plan was discussed with the patient. The patient verbalized understanding of and has agreed to the management plan. Patient is aware to call the clinic if symptoms persist or worsen. Patient is aware when to return to the clinic for a follow-up visit. Patient educated on when it is appropriate to go to the emergency department.  ? ?Time call ended:  12:45 pm  ? ?I provided 12 minutes of  non face-to-face time during this encounter. ? ? ? ?Evelina Dun, FNP ?' ?

## 2022-02-04 ENCOUNTER — Other Ambulatory Visit: Payer: Self-pay | Admitting: Family

## 2022-02-04 DIAGNOSIS — E1159 Type 2 diabetes mellitus with other circulatory complications: Secondary | ICD-10-CM

## 2022-02-07 LAB — HM DIABETES EYE EXAM

## 2022-03-18 ENCOUNTER — Other Ambulatory Visit: Payer: Self-pay | Admitting: Family

## 2022-03-18 DIAGNOSIS — F411 Generalized anxiety disorder: Secondary | ICD-10-CM

## 2022-03-21 DIAGNOSIS — R42 Dizziness and giddiness: Secondary | ICD-10-CM | POA: Diagnosis not present

## 2022-03-21 DIAGNOSIS — H903 Sensorineural hearing loss, bilateral: Secondary | ICD-10-CM | POA: Diagnosis not present

## 2022-03-21 DIAGNOSIS — H9313 Tinnitus, bilateral: Secondary | ICD-10-CM | POA: Diagnosis not present

## 2022-04-30 ENCOUNTER — Encounter: Payer: Self-pay | Admitting: Family

## 2022-04-30 ENCOUNTER — Ambulatory Visit: Payer: Medicare PPO | Admitting: Family

## 2022-04-30 VITALS — BP 121/72 | HR 65 | Temp 97.2°F | Ht 67.0 in | Wt 215.8 lb

## 2022-04-30 DIAGNOSIS — F411 Generalized anxiety disorder: Secondary | ICD-10-CM

## 2022-04-30 DIAGNOSIS — E1159 Type 2 diabetes mellitus with other circulatory complications: Secondary | ICD-10-CM

## 2022-04-30 DIAGNOSIS — I152 Hypertension secondary to endocrine disorders: Secondary | ICD-10-CM

## 2022-04-30 DIAGNOSIS — Z23 Encounter for immunization: Secondary | ICD-10-CM

## 2022-04-30 DIAGNOSIS — E1169 Type 2 diabetes mellitus with other specified complication: Secondary | ICD-10-CM

## 2022-04-30 DIAGNOSIS — M8580 Other specified disorders of bone density and structure, unspecified site: Secondary | ICD-10-CM

## 2022-04-30 DIAGNOSIS — E785 Hyperlipidemia, unspecified: Secondary | ICD-10-CM

## 2022-04-30 DIAGNOSIS — Z79899 Other long term (current) drug therapy: Secondary | ICD-10-CM

## 2022-04-30 DIAGNOSIS — E559 Vitamin D deficiency, unspecified: Secondary | ICD-10-CM | POA: Diagnosis not present

## 2022-04-30 DIAGNOSIS — G4700 Insomnia, unspecified: Secondary | ICD-10-CM | POA: Diagnosis not present

## 2022-04-30 DIAGNOSIS — N3281 Overactive bladder: Secondary | ICD-10-CM | POA: Insufficient documentation

## 2022-04-30 DIAGNOSIS — E669 Obesity, unspecified: Secondary | ICD-10-CM

## 2022-04-30 DIAGNOSIS — K5900 Constipation, unspecified: Secondary | ICD-10-CM

## 2022-04-30 LAB — CBC WITH DIFFERENTIAL/PLATELET
Basophils Absolute: 0 10*3/uL (ref 0.0–0.2)
Basos: 1 %
EOS (ABSOLUTE): 0.1 10*3/uL (ref 0.0–0.4)
Eos: 2 %
Hematocrit: 40.7 % (ref 34.0–46.6)
Hemoglobin: 13.7 g/dL (ref 11.1–15.9)
Immature Grans (Abs): 0 10*3/uL (ref 0.0–0.1)
Immature Granulocytes: 0 %
Lymphocytes Absolute: 1.7 10*3/uL (ref 0.7–3.1)
Lymphs: 23 %
MCH: 29.8 pg (ref 26.6–33.0)
MCHC: 33.7 g/dL (ref 31.5–35.7)
MCV: 89 fL (ref 79–97)
Monocytes Absolute: 0.4 10*3/uL (ref 0.1–0.9)
Monocytes: 6 %
Neutrophils Absolute: 5 10*3/uL (ref 1.4–7.0)
Neutrophils: 68 %
Platelets: 233 10*3/uL (ref 150–450)
RBC: 4.6 x10E6/uL (ref 3.77–5.28)
RDW: 12.4 % (ref 11.7–15.4)
WBC: 7.3 10*3/uL (ref 3.4–10.8)

## 2022-04-30 LAB — CMP14+EGFR
ALT: 10 IU/L (ref 0–32)
AST: 15 IU/L (ref 0–40)
Albumin/Globulin Ratio: 1.7 (ref 1.2–2.2)
Albumin: 4.4 g/dL (ref 3.8–4.8)
Alkaline Phosphatase: 86 IU/L (ref 44–121)
BUN/Creatinine Ratio: 18 (ref 12–28)
BUN: 15 mg/dL (ref 8–27)
Bilirubin Total: 0.3 mg/dL (ref 0.0–1.2)
CO2: 23 mmol/L (ref 20–29)
Calcium: 10.2 mg/dL (ref 8.7–10.3)
Chloride: 104 mmol/L (ref 96–106)
Creatinine, Ser: 0.85 mg/dL (ref 0.57–1.00)
Globulin, Total: 2.6 g/dL (ref 1.5–4.5)
Glucose: 143 mg/dL — ABNORMAL HIGH (ref 70–99)
Potassium: 4.5 mmol/L (ref 3.5–5.2)
Sodium: 142 mmol/L (ref 134–144)
Total Protein: 7 g/dL (ref 6.0–8.5)
eGFR: 69 mL/min/{1.73_m2} (ref >59)

## 2022-04-30 LAB — BAYER DCA HB A1C WAIVED: HB A1C (BAYER DCA - WAIVED): 5.6 % (ref 4.8–5.6)

## 2022-04-30 NOTE — Patient Instructions (Signed)
Health Maintenance After Age 80 After age 80, you are at a higher risk for certain long-term diseases and infections as well as injuries from falls. Falls are a major cause of broken bones and head injuries in people who are older than age 80. Getting regular preventive care can help to keep you healthy and well. Preventive care includes getting regular testing and making lifestyle changes as recommended by your health care provider. Talk with your health care provider about: Which screenings and tests you should have. A screening is a test that checks for a disease when you have no symptoms. A diet and exercise plan that is right for you. What should I know about screenings and tests to prevent falls? Screening and testing are the best ways to find a health problem early. Early diagnosis and treatment give you the best chance of managing medical conditions that are common after age 80. Certain conditions and lifestyle choices may make you more likely to have a fall. Your health care provider may recommend: Regular vision checks. Poor vision and conditions such as cataracts can make you more likely to have a fall. If you wear glasses, make sure to get your prescription updated if your vision changes. Medicine review. Work with your health care provider to regularly review all of the medicines you are taking, including over-the-counter medicines. Ask your health care provider about any side effects that may make you more likely to have a fall. Tell your health care provider if any medicines that you take make you feel dizzy or sleepy. Strength and balance checks. Your health care provider may recommend certain tests to check your strength and balance while standing, walking, or changing positions. Foot health exam. Foot pain and numbness, as well as not wearing proper footwear, can make you more likely to have a fall. Screenings, including: Osteoporosis screening. Osteoporosis is a condition that causes  the bones to get weaker and break more easily. Blood pressure screening. Blood pressure changes and medicines to control blood pressure can make you feel dizzy. Depression screening. You may be more likely to have a fall if you have a fear of falling, feel depressed, or feel unable to do activities that you used to do. Alcohol use screening. Using too much alcohol can affect your balance and may make you more likely to have a fall. Follow these instructions at home: Lifestyle Do not drink alcohol if: Your health care provider tells you not to drink. If you drink alcohol: Limit how much you have to: 0-1 drink a day for women. 0-2 drinks a day for men. Know how much alcohol is in your drink. In the [place] one drink equals one 12 oz bottle of beer (355 mL), one 5 oz glass of wine (148 mL), or one 1 oz glass of hard liquor (44 mL). Do not use any products that contain nicotine or tobacco. These products include cigarettes, chewing tobacco, and vaping devices, such as e-cigarettes. If you need help quitting, ask your health care provider. Activity  Follow a regular exercise program to stay fit. This will help you maintain your balance. Ask your health care provider what types of exercise are appropriate for you. If you need a cane or walker, use it as recommended by your health care provider. Wear supportive shoes that have nonskid soles. Safety  Remove any tripping hazards, such as rugs, cords, and clutter. Install safety equipment such as grab bars in bathrooms and safety rails on stairs. Keep rooms and walkways   well-lit. General instructions Talk with your health care provider about your risks for falling. Tell your health care provider if: You fall. Be sure to tell your health care provider about all falls, even ones that seem minor. You feel dizzy, tiredness (fatigue), or off-balance. Take over-the-counter and prescription medicines only as told by your health care provider. These include  supplements. Eat a healthy diet and maintain a healthy weight. A healthy diet includes low-fat dairy products, low-fat (lean) meats, and fiber from whole grains, beans, and lots of fruits and vegetables. Stay current with your vaccines. Schedule regular health, dental, and eye exams. Summary Having a healthy lifestyle and getting preventive care can help to protect your health and wellness after age 80. Screening and testing are the best way to find a health problem early and help you avoid having a fall. Early diagnosis and treatment give you the best chance for managing medical conditions that are more common for people who are older than age 80. Falls are a major cause of broken bones and head injuries in people who are older than age 80. Take precautions to prevent a fall at home. Work with your health care provider to learn what changes you can make to improve your health and wellness and to prevent falls. This information is not intended to replace advice given to you by your health care provider. Make sure you discuss any questions you have with your health care provider. Document Revised: 01/22/2021 Document Reviewed: 01/22/2021 Elsevier Patient Education  2023 [employer] Inc[employer]  

## 2022-04-30 NOTE — Progress Notes (Signed)
Subjective:    Patient ID: Jeanette Daniels, female    DOB: 1941-06-22, 81 y.o.   MRN: [id number]  Chief Complaint  Patient presents with   Medical Management of Chronic Issues    Sinus drainage    Pt presents to the office today for chronic follow up. She is retired now and enjoying life.    She has hx of left breast cancer in 2006 and is followed by Oncologists annually.    She has osteopenia, her last dexa scan was 05/11/18. Hypertension This is a chronic problem. The current episode started more than 1 year ago. The problem has been resolved since onset. Associated symptoms include anxiety. Pertinent negatives include no blurred vision, malaise/fatigue, peripheral edema or shortness of breath. Risk factors for coronary artery disease include diabetes mellitus, dyslipidemia and obesity. The current treatment provides moderate improvement.  Hyperlipidemia This is a chronic problem. The current episode started more than 1 year ago. The problem is controlled. Recent lipid tests were reviewed and are normal. Exacerbating diseases include obesity. Pertinent negatives include no shortness of breath. Current antihyperlipidemic treatment includes statins. The current treatment provides moderate improvement of lipids. Risk factors for coronary artery disease include dyslipidemia, diabetes mellitus, hypertension, a sedentary lifestyle and post-menopausal.  Diabetes She presents for her follow-up diabetic visit. She has type 2 diabetes mellitus. Hypoglycemia symptoms include nervousness/anxiousness. Pertinent negatives for diabetes include no blurred vision and no foot paresthesias. Symptoms are stable. Diabetic complications include heart disease. Risk factors for coronary artery disease include dyslipidemia, diabetes mellitus, hypertension, sedentary lifestyle and post-menopausal. She is following a generally unhealthy diet. Her overall blood glucose range is 110-130 mg/dl. Eye exam is current.   Insomnia Primary symptoms: difficulty falling asleep, frequent awakening, no malaise/fatigue.   The current episode started more than one year. The onset quality is gradual. The problem occurs intermittently.  Anxiety Presents for follow-up visit. Symptoms include depressed mood, insomnia, irritability and nervous/anxious behavior. Patient reports no shortness of breath. Symptoms occur occasionally. The severity of symptoms is moderate.    Constipation This is a chronic problem. The current episode started more than 1 year ago. The problem has been resolved since onset. Her stool frequency is 1 time per day. Risk factors include obesity. She has tried diet changes and laxatives for the symptoms. The treatment provided moderate relief.  Urinary Frequency  This is a chronic problem. The current episode started more than 1 year ago. The problem occurs intermittently. The problem has been waxing and waning. The patient is experiencing no pain. Associated symptoms include frequency. Treatments tried: oxybutynin.      Review of Systems  Constitutional:  Positive for irritability. Negative for malaise/fatigue.  Eyes:  Negative for blurred vision.  Respiratory:  Negative for shortness of breath.   Gastrointestinal:  Positive for constipation.  Genitourinary:  Positive for frequency.  Psychiatric/Behavioral:  The patient is nervous/anxious and has insomnia.   All other systems reviewed and are negative.      Objective:   Physical Exam Vitals reviewed.  Constitutional:      General: She is not in acute distress.    Appearance: She is well-developed.  HENT:     Head: Normocephalic and atraumatic.     Right Ear: External ear normal.  Eyes:     Pupils: Pupils are equal, round, and reactive to light.  Neck:     Thyroid: No thyromegaly.  Cardiovascular:     Rate and Rhythm: Normal rate and regular rhythm.  Heart sounds: Normal heart sounds. No murmur heard. Pulmonary:     Effort:  Pulmonary effort is normal. No respiratory distress.     Breath sounds: Normal breath sounds. No wheezing.  Abdominal:     General: Bowel sounds are normal. There is no distension.     Palpations: Abdomen is soft.     Tenderness: There is no abdominal tenderness.  Musculoskeletal:        General: No tenderness. Normal range of motion.     Cervical back: Normal range of motion and neck supple.  Skin:    General: Skin is warm and dry.  Neurological:     Mental Status: She is alert and oriented to person, place, and time.     Cranial Nerves: No cranial nerve deficit.     Deep Tendon Reflexes: Reflexes are normal and symmetric.  Psychiatric:        Behavior: Behavior normal.        Thought Content: Thought content normal.        Judgment: Judgment normal.       BP 121/72   Pulse 65   Temp (!) 97.2 F (36.2 C) (Temporal)   Ht '5\' 7"'  (1.702 m)   Wt 215 lb 12.8 oz (97.9 kg)   BMI 33.80 kg/m      Assessment & Plan:  CRISTELLA STIVER comes in today with chief complaint of Medical Management of Chronic Issues (Sinus drainage )   Diagnosis and orders addressed:  1. Hypertension associated with diabetes ([place]) - CMP14+EGFR - CBC with Differential/Platelet  2. Type 2 diabetes mellitus with other specified complication, without long-term current use of insulin ([facility]) - Bayer DCA Hb A1c Waived - CMP14+EGFR - CBC with Differential/Platelet - Microalbumin / creatinine urine ratio  3. Hyperlipidemia associated with type 2 diabetes mellitus ([facility]) - CMP14+EGFR - CBC with Differential/Platelet  4. Osteopenia, unspecified location - CMP14+EGFR - CBC with Differential/Platelet  5. Vitamin D deficiency  - CMP14+EGFR - CBC with Differential/Platelet  6. Obesity (BMI 30-39.9) - CMP14+EGFR - CBC with Differential/Platelet  7. Insomnia, unspecified type - CMP14+EGFR - CBC with Differential/Platelet  8. GAD (generalized anxiety disorder)  - [employer]ox[employer] Select 13 ([place]), Urine -  CMP14+EGFR - CBC with Differential/Platelet  9. Controlled substance agreement signed - [employer] Select 13 ([place]), Urine - CMP14+EGFR - CBC with Differential/Platelet  10. Constipation, unspecified constipation type - CMP14+EGFR - CBC with Differential/Platelet  11. Overactive bladder   Labs pending Patient reviewed in [place] controlled database, no flags noted. Contract and drug screen are up to date.  Health Maintenance reviewed Diet and exercise encouraged  Follow up plan: 6 months    Evelina Dun, FNP

## 2022-05-01 ENCOUNTER — Ambulatory Visit (INDEPENDENT_AMBULATORY_CARE_PROVIDER_SITE_OTHER): Payer: Medicare PPO

## 2022-05-01 VITALS — Wt 215.0 lb

## 2022-05-01 DIAGNOSIS — Z0000 Encounter for general adult medical examination without abnormal findings: Secondary | ICD-10-CM | POA: Diagnosis not present

## 2022-05-01 LAB — MICROALBUMIN / CREATININE URINE RATIO
Creatinine, Urine: 190 mg/dL
Microalb/Creat Ratio: 16 mg/g creat (ref 0–29)
Microalbumin, Urine: 31.3 ug/mL

## 2022-05-01 NOTE — Patient Instructions (Signed)
Jeanette Daniels , Thank you for taking time to come for your Medicare Wellness Visit. I appreciate your ongoing commitment to your health goals. Please review the following plan we discussed and let me know if I can assist you in the future.   Screening recommendations/referrals: Colonoscopy: No longer required Mammogram: Done 04/30/2021 - [facility] [place] *keep appointment Bone Density: Done 05/11/2018 - Repeat every 2 years  Recommended yearly ophthalmology/optometry visit for glaucoma screening and checkup Recommended yearly dental visit for hygiene and checkup  Vaccinations: Influenza vaccine: Done 06/25/2021 - Repeat annually  Pneumococcal vaccine: Done 06/28/2010 & 05/24/2014  Tdap vaccine: Done 04/27/2021 - Repeat in 10 years  Shingles vaccine: Done   10/29/2021 & 04/30/2022    Covid-19: Done  10/09/2019, 11/07/2019, 07/12/2020  Advanced directives: Please bring a copy of your health care power of attorney and living will to the office to be added to your chart at your convenience.   Conditions/risks identified: Aim for 30 minutes of exercise or brisk walking, 6-8 glasses of water, and 5 servings of fruits and vegetables each day.   Next appointment: Follow up in one year for your annual wellness visit    Preventive Care 65 Years and Older, Female Preventive care refers to lifestyle choices and visits with your health care provider that can promote health and wellness. What does preventive care include? A yearly physical exam. This is also called an annual well check. Dental exams once or twice a year. Routine eye exams. Ask your health care provider how often you should have your eyes checked. Personal lifestyle choices, including: Daily care of your teeth and gums. Regular physical activity. Eating a healthy diet. Avoiding tobacco and drug use. Limiting alcohol use. Practicing safe sex. Taking low-dose aspirin every day. Taking vitamin and mineral  supplements as recommended by your health care provider. What happens during an annual well check? The services and screenings done by your health care provider during your annual well check will depend on your age, overall health, lifestyle risk factors, and family history of disease. Counseling  Your health care provider may ask you questions about your: Alcohol use. Tobacco use. Drug use. Emotional well-being. Home and relationship well-being. Sexual activity. Eating habits. History of falls. Memory and ability to understand (cognition). Work and work Statistician. Reproductive health. Screening  You may have the following tests or measurements: Height, weight, and BMI. Blood pressure. Lipid and cholesterol levels. These may be checked every 5 years, or more frequently if you are over 21 years old. Skin check. Lung cancer screening. You may have this screening every year starting at age 13 if you have a 30-pack-year history of smoking and currently smoke or have quit within the past 15 years. Fecal occult blood test (FOBT) of the stool. You may have this test every year starting at age 48. Flexible sigmoidoscopy or colonoscopy. You may have a sigmoidoscopy every 5 years or a colonoscopy every 10 years starting at age 47. Hepatitis C blood test. Hepatitis B blood test. Sexually transmitted disease (STD) testing. Diabetes screening. This is done by checking your blood sugar (glucose) after you have not eaten for a while (fasting). You may have this done every 1-3 years. Bone density scan. This is done to screen for osteoporosis. You may have this done starting at age 3. Mammogram. This may be done every 1-2 years. Talk to your health care provider about how often you should have regular mammograms. Talk with your health care  provider about your test results, treatment options, and if necessary, the need for more tests. Vaccines  Your health care provider may recommend certain  vaccines, such as: Influenza vaccine. This is recommended every year. Tetanus, diphtheria, and acellular pertussis (Tdap, Td) vaccine. You may need a Td booster every 10 years. Zoster vaccine. You may need this after age 84. Pneumococcal 13-valent conjugate (PCV13) vaccine. One dose is recommended after age 2. Pneumococcal polysaccharide (PPSV23) vaccine. One dose is recommended after age 35. Talk to your health care provider about which screenings and vaccines you need and how often you need them. This information is not intended to replace advice given to you by your health care provider. Make sure you discuss any questions you have with your health care provider. Document Released: 09/29/2015 Document Revised: 05/22/2016 Document Reviewed: 07/04/2015 Elsevier Interactive Patient Education  2017 [place] Prevention in the Home Falls can cause injuries. They can happen to people of all ages. There are many things you can do to make your home safe and to help prevent falls. What can I do on the outside of my home? Regularly fix the edges of walkways and driveways and fix any cracks. Remove anything that might make you trip as you walk through a door, such as a raised step or threshold. Trim any bushes or trees on the path to your home. Use bright outdoor lighting. Clear any walking paths of anything that might make someone trip, such as rocks or tools. Regularly check to see if handrails are loose or broken. Make sure that both sides of any steps have handrails. Any raised decks and porches should have guardrails on the edges. Have any leaves, snow, or ice cleared regularly. Use sand or salt on walking paths during winter. Clean up any spills in your garage right away. This includes oil or grease spills. What can I do in the bathroom? Use night lights. Install grab bars by the toilet and in the tub and shower. Do not use towel bars as grab bars. Use non-skid mats or decals in  the tub or shower. If you need to sit down in the shower, use a plastic, non-slip stool. Keep the floor dry. Clean up any water that spills on the floor as soon as it happens. Remove soap buildup in the tub or shower regularly. Attach bath mats securely with double-sided non-slip rug tape. Do not have throw rugs and other things on the floor that can make you trip. What can I do in the bedroom? Use night lights. Make sure that you have a light by your bed that is easy to reach. Do not use any sheets or blankets that are too big for your bed. They should not hang down onto the floor. Have a firm chair that has side arms. You can use this for support while you get dressed. Do not have throw rugs and other things on the floor that can make you trip. What can I do in the kitchen? Clean up any spills right away. Avoid walking on wet floors. Keep items that you use a lot in easy-to-reach places. If you need to reach something above you, use a strong step stool that has a grab bar. Keep electrical cords out of the way. Do not use floor polish or wax that makes floors slippery. If you must use wax, use non-skid floor wax. Do not have throw rugs and other things on the floor that can make you trip. What can I  do with my stairs? Do not leave any items on the stairs. Make sure that there are handrails on both sides of the stairs and use them. Fix handrails that are broken or loose. Make sure that handrails are as long as the stairways. Check any carpeting to make sure that it is firmly attached to the stairs. Fix any carpet that is loose or worn. Avoid having throw rugs at the top or bottom of the stairs. If you do have throw rugs, attach them to the floor with carpet tape. Make sure that you have a light switch at the top of the stairs and the bottom of the stairs. If you do not have them, ask someone to add them for you. What else can I do to help prevent falls? Wear shoes that: Do not have high  heels. Have rubber bottoms. Are comfortable and fit you well. Are closed at the toe. Do not wear sandals. If you use a stepladder: Make sure that it is fully opened. Do not climb a closed stepladder. Make sure that both sides of the stepladder are locked into place. Ask someone to hold it for you, if possible. Clearly mark and make sure that you can see: Any grab bars or handrails. First and last steps. Where the edge of each step is. Use tools that help you move around (mobility aids) if they are needed. These include: Canes. Walkers. Scooters. Crutches. Turn on the lights when you go into a dark area. Replace any light bulbs as soon as they burn out. Set up your furniture so you have a clear path. Avoid moving your furniture around. If any of your floors are uneven, fix them. If there are any pets around you, be aware of where they are. Review your medicines with your doctor. Some medicines can make you feel dizzy. This can increase your chance of falling. Ask your doctor what other things that you can do to help prevent falls. This information is not intended to replace advice given to you by your health care provider. Make sure you discuss any questions you have with your health care provider. Document Released: 06/29/2009 Document Revised: 02/08/2016 Document Reviewed: 10/07/2014 Elsevier Interactive Patient Education  2017 [employer].

## 2022-05-01 NOTE — Progress Notes (Signed)
Subjective:   Jeanette Daniels is a 81 y.o. female who presents for Medicare Annual (Subsequent) preventive examination.  Virtual Visit via Telephone Note  I connected with  Jeanette Daniels on 05/01/22 at 10:30 AM EDT by telephone and verified that I am speaking with the correct person using two identifiers.  Location: Patient: Home Provider: [facility] Persons participating in the virtual visit: patient/Nurse Health Advisor   I discussed the limitations, risks, security and privacy concerns of performing an evaluation and management service by telephone and the availability of in person appointments. The patient expressed understanding and agreed to proceed.  Interactive audio and video telecommunications were attempted between this nurse and patient, however failed, due to patient having technical difficulties OR patient did not have access to video capability.  We continued and completed visit with audio only.  Some vital signs may be absent or patient reported.   Geovana Gebel E Amarius Toto, LPN   Review of Systems     Cardiac Risk Factors include: advanced age (>27mn, >>39women);diabetes mellitus;dyslipidemia;hypertension;sedentary lifestyle;obesity (BMI >30kg/m2)     Objective:    Today's Vitals   05/01/22 1033  Weight: 215 lb (97.5 kg)   Body mass index is 33.67 kg/m.     05/01/2022   10:42 AM 04/30/2021    1:30 PM 04/26/2020   11:18 AM 03/02/2019    1:40 PM  Advanced Directives  Does Patient Have a Medical Advance Directive? Yes Yes Yes No  Type of AParamedicof [place] will [place] will [place]  Does patient want to make changes to medical advance directive?   No - Patient declined   Copy of [place] Chart? No - copy requested No - copy requested No - copy requested   Would patient like information on creating a medical advance directive?    No - Patient declined    Current  Medications (verified) Outpatient Encounter Medications as of 05/01/2022  Medication Sig   Ascorbic Acid (VITAMIN C) 100 MG tablet Take 100 mg by mouth daily.   aspirin EC 81 MG tablet Take 1 tablet (81 mg total) by mouth daily.   atorvastatin (LIPITOR) 20 MG tablet Take 1 tablet (20 mg total) by mouth daily.   Cholecalciferol (VITAMIN D) 2000 UNITS CAPS Take 1,000 Units by mouth 2 (two) times daily. gummie chewable   docusate sodium (COLACE) 100 MG capsule Take 100 mg by mouth daily.   glucose blood (ONETOUCH ULTRA) test strip Check BS up to 4 times daily Dx E11.9   losartan (COZAAR) 50 MG tablet Take 2 tablets (100 mg total) by mouth daily.   metoprolol succinate (TOPROL-XL) 100 MG 24 hr tablet Take 1 tablet (100 mg total) by mouth 2 (two) times daily. Take with or immediately following a meal.   oxybutynin (DITROPAN XL) 10 MG 24 hr tablet Take 1 tablet (10 mg total) by mouth at bedtime.   sertraline (ZOLOFT) 100 MG tablet Take 2 tablets (200 mg total) by mouth daily.   Vitamin D, Ergocalciferol, (DRISDOL) 1.25 MG (50000 UNIT) CAPS capsule Take 1 capsule (50,000 Units total) by mouth every 7 (seven) days.   zolpidem (AMBIEN) 10 MG tablet Take 1 tablet (10 mg total) by mouth at bedtime.   acyclovir ointment (ZOVIRAX) 5 % Apply 1 application topically every 3 (three) hours. (Patient not taking: Reported on 05/01/2022)   meclizine (ANTIVERT) 25 MG tablet Take 1 tablet (25 mg total) by mouth  3 (three) times daily as needed for dizziness. (Patient not taking: Reported on 05/01/2022)   No facility-administered encounter medications on file as of 05/01/2022.    Allergies (verified) Hydrochlorothiazide, Ace inhibitors, Augmentin [amoxicillin-pot clavulanate], and Celecoxib   History: Past Medical History:  Diagnosis Date   Breast cancer (HCC)    S/P chemotherapy, radiation, and surgery   Diabetes mellitus without complication ([place])    Hyperlipidemia    x16 years   Hypertension    x16 years    Past Surgical History:  Procedure Laterality Date   MASTECTOMY     Left   TOTAL KNEE ARTHROPLASTY     Right   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  Tobacco Use   Smoking status: Never   Smokeless tobacco: Never  Vaping Use   Vaping Use: Never used  Substance and Sexual Activity   Alcohol use: No   Drug use: No   Sexual activity: Yes    Birth control/protection: Post-menopausal  Other Topics Concern   Not on file  Social History Narrative   Married with 2 children   Social Determinants of Health   Financial Resource Strain: Low Risk  (05/01/2022)   Overall Financial Resource Strain (CARDIA)    Difficulty of Paying Living Expenses: Not hard at all  Food Insecurity: No Food Insecurity (05/01/2022)   Hunger Vital Sign    Worried About Running Out of Food in the Last Year: Never true    [place] in the Last Year: Never true  Transportation Needs: No Transportation Needs (05/01/2022)   PRAPARE - Hydrologist (Medical): No    Lack of Transportation (Non-Medical): No  Physical Activity: Insufficiently Active (05/01/2022)   Exercise Vital Sign    Days of Exercise per Week: 6 days    Minutes of Exercise per Session: 20 min  Stress: No Stress Concern Present (05/01/2022)   [place]    Feeling of Stress : Not at all  Social Connections: [place] (05/01/2022)   Social Connection and Isolation Panel [NHANES]    Frequency of Communication  with Friends and Family: More than three times a week    Frequency of Social Gatherings with Friends and Family: More than three times a week    Attends Religious Services: More than 4 times per year    Active Member of [employer] or Organizations: Yes    Attends Music therapist: More than 4 times per year    Marital Status: Married    Tobacco Counseling Counseling given: Not Answered   Clinical Intake:  Pre-visit preparation completed: Yes  Pain : No/denies pain     BMI - recorded: 33.67 Nutritional Status: BMI > 30  Obese Nutritional Risks: None Diabetes: Yes CBG done?: No Did pt. bring in CBG monitor from home?: No  How often do you need to have someone help you when you read instructions, pamphlets, or other written  materials from your doctor or pharmacy?: 1 - Never  Diabetic? Nutrition Risk Assessment:  Has the patient had any N/V/D within the last 2 months?  No  Does the patient have any non-healing wounds?  No  Has the patient had any unintentional weight loss or weight gain?  No   Diabetes:  Is the patient diabetic?  Yes  If diabetic, was a CBG obtained today?  No  Did the patient bring in their glucometer from home?  No  How often do you monitor your CBG's? Once per day - 110 this am per pt.   Financial Strains and Diabetes Management:  Are you having any financial strains with the device, your supplies or your medication? No .  Does the patient want to be seen by Chronic Care Management for management of their diabetes?  No  Would the patient like to be referred to a Nutritionist or for Diabetic Management?  No   Diabetic Exams:  Diabetic Eye Exam: Completed 12/2021.   Diabetic Foot Exam: Completed 01/03/2022. Pt has been advised about the importance in completing this exam.   Interpreter Needed?: No  Information entered by :: Fredia Chittenden, LPN   Activities of Daily Living    05/01/2022   10:42 AM  In your present state of health, do you  have any difficulty performing the following activities:  Hearing? 0  Vision? 0  Difficulty concentrating or making decisions? 0  Walking or climbing stairs? 0  Dressing or bathing? 0  Doing errands, shopping? 0  Preparing Food and eating ? N  Using the Toilet? N  In the past six months, have you accidently leaked urine? Y  Comment much improved with medication  Do you have problems with loss of bowel control? N  Managing your Medications? N  Managing your Finances? N  Housekeeping or managing your Housekeeping? N    Patient Care Team: Sharion Balloon, FNP as PCP - General (Nurse Practitioner)  Indicate any recent Medical Services you may have received from other than [facility] providers in the past year (date may be approximate).     Assessment:   This is a routine wellness examination for Jaunice.  Hearing/Vision screen Hearing Screening - Comments:: Denies hearing difficulties   Vision Screening - Comments:: Wears rx glasses - up to date with routine eye exams with [facility] in [place], [place]  Dietary issues and exercise activities discussed: Current Exercise Habits: Home exercise routine, Type of exercise: walking;stretching, Time (Minutes): 20, Frequency (Times/Week): 6, Weekly Exercise (Minutes/Week): 120, Intensity: Mild, Exercise limited by: orthopedic condition(s)   Goals Addressed             This Visit's Progress    Prevent falls   On track    05/01/2022 - Keep walking, maintain independence       Depression Screen    05/01/2022   10:40 AM 04/30/2022    9:30 AM 04/30/2021    1:29 PM 04/27/2021    1:57 PM 01/23/2021    4:04 PM 10/26/2020    2:56 PM 04/26/2020   11:12 AM  PHQ 2/9 Scores  PHQ - 2 Score 0 0 0 0 0 0 0  PHQ- 9 Score 0 0 0 0       Fall Risk    05/01/2022   10:36 AM 04/30/2022    9:30 AM 04/30/2021    1:31 PM 01/23/2021    4:04 PM 10/26/2020    2:56 PM  [place]  in the past year? 0 0 0 0 0  Number falls in past yr: 0  0    Injury  with Fall? 0  0    Risk for fall due to : Orthopedic patient  Orthopedic patient;Impaired vision;Medication side effect    Follow up Falls prevention discussed  Falls prevention discussed      FALL RISK PREVENTION PERTAINING TO THE HOME:  Any stairs in or around the home? Yes  If so, are there any without handrails? No  Home free of loose throw rugs in walkways, pet beds, electrical cords, etc? Yes  Adequate lighting in your home to reduce risk of falls? Yes   ASSISTIVE DEVICES UTILIZED TO PREVENT FALLS:  Life alert? No  Use of a cane, walker or w/c? No  Grab bars in the bathroom? Yes  Shower chair or bench in shower? Yes  Elevated toilet seat or a handicapped toilet? Yes   TIMED UP AND GO:  Was the test performed? No . Telephonic visit  Cognitive Function:        05/01/2022   10:42 AM 04/26/2020   11:22 AM 03/02/2019    1:44 PM  6CIT Screen  What Year? 0 points 0 points 0 points  What month? 0 points 0 points 0 points  What time? 0 points 0 points 0 points  Count back from 20 0 points 0 points 0 points  Months in reverse 0 points 0 points 0 points  Repeat phrase 4 points 2 points 0 points  Total Score 4 points 2 points 0 points    Immunizations Immunization History  Administered Date(s) Administered   Fluad Quad(high Dose 65+) 06/07/2019, 06/23/2020, 06/25/2021   H1N1 10/07/2008   Hepatitis B 07/28/1998, 08/28/1998, 01/29/1999   Influenza Split 06/15/2008, 06/20/2011, 06/16/2018   Influenza Whole 07/23/2012   Influenza, High Dose Seasonal PF 07/11/2017, 06/10/2018   Influenza, Seasonal, Injecte, Preservative Fre 06/09/2009, 06/28/2010, 07/23/2012   Influenza,inj,Quad PF,6+ Mos 06/23/2013, 07/11/2014, 07/11/2015, 07/10/2016, 06/07/2019   Influenza-Unspecified 08/27/2000, 09/21/2003, 06/16/2018   Moderna Sars-Covid-2 Vaccination 10/09/2019, 11/07/2019, 07/12/2020   Pneumococcal Conjugate-13 05/24/2014   Pneumococcal Polysaccharide-23 06/16/2010, 06/28/2010   Tdap  02/14/2009, 03/04/2011, 04/27/2021   Zoster Recombinat (Shingrix) 10/29/2021, 04/30/2022   Zoster, Live 09/16/2005    TDAP status: Up to date  Flu Vaccine status: Up to date  Pneumococcal vaccine status: Up to date  Covid-19 vaccine status: Completed vaccines  Qualifies for Shingles Vaccine? Yes   Zostavax completed Yes   Shingrix Completed?: Yes  Screening Tests Health Maintenance  Topic Date Due   DEXA SCAN  05/11/2020   COVID-19 Vaccine (4 - Moderna risk series) 09/06/2020   OPHTHALMOLOGY EXAM  01/03/2022   INFLUENZA VACCINE  12/15/2022 (Originally 04/16/2022)   HEMOGLOBIN A1C  10/31/2022   FOOT EXAM  01/04/2023   TETANUS/TDAP  04/28/2031   Pneumonia Vaccine 83+ Years old  Completed   Zoster Vaccines- Shingrix  Completed   HPV VACCINES  Aged Out    Health Maintenance  Health Maintenance Due  Topic Date Due   DEXA SCAN  05/11/2020   COVID-19 Vaccine (4 - Moderna risk series) 09/06/2020   OPHTHALMOLOGY EXAM  01/03/2022    Colorectal cancer screening: No longer required.   Mammogram status: Completed 04/30/2021. Repeat every year She has appt coming up  Bone Density status: Completed 05/11/2018. Results reflect: Bone density results: OSTEOPENIA. Repeat every 2 years.  Lung Cancer Screening: (Low Dose CT Chest recommended if Age 55-80 years, 30 pack-year currently smoking OR have  quit w/in 15years.) does not qualify.  Additional Screening:  Hepatitis C Screening: does not qualify  Vision Screening: Recommended annual ophthalmology exams for early detection of glaucoma and other disorders of the eye. Is the patient up to date with their annual eye exam?  Yes  Who is the provider or what is the name of the office in which the patient attends annual eye exams? [facility] in [place], [place] If pt is not established with a provider, would they like to be referred to a provider to establish care? No .   Dental Screening: Recommended annual dental exams for proper  oral hygiene  Community Resource Referral / Chronic Care Management: CRR required this visit?  No   CCM required this visit?  No      Plan:     I have personally reviewed and noted the following in the patient's chart:   Medical and social history Use of alcohol, tobacco or illicit drugs  Current medications and supplements including opioid prescriptions.  Functional ability and status Nutritional status Physical activity Advanced directives List of other physicians Hospitalizations, surgeries, and ER visits in previous 12 months Vitals Screenings to include cognitive, depression, and falls Referrals and appointments  In addition, I have reviewed and discussed with patient certain preventive protocols, quality metrics, and best practice recommendations. A written personalized care plan for preventive services as well as general preventive health recommendations were provided to patient.     Sandrea Hammond, LPN   1/69/6789   Nurse Notes: She saw ENT for vertigo - was told she has fluid behind ears - resolved on its own, so she didn't go to PT, but she has order good for a year if it comes back.

## 2022-05-03 LAB — TOXASSURE SELECT 13 (MW), URINE

## 2022-05-15 ENCOUNTER — Other Ambulatory Visit: Payer: Self-pay | Admitting: Family

## 2022-05-15 DIAGNOSIS — R42 Dizziness and giddiness: Secondary | ICD-10-CM

## 2022-05-15 DIAGNOSIS — G4700 Insomnia, unspecified: Secondary | ICD-10-CM

## 2022-05-31 DIAGNOSIS — Z1231 Encounter for screening mammogram for malignant neoplasm of breast: Secondary | ICD-10-CM | POA: Diagnosis not present

## 2022-06-03 ENCOUNTER — Other Ambulatory Visit: Payer: Self-pay | Admitting: Family

## 2022-06-03 DIAGNOSIS — E1169 Type 2 diabetes mellitus with other specified complication: Secondary | ICD-10-CM

## 2022-06-12 ENCOUNTER — Other Ambulatory Visit: Payer: Self-pay | Admitting: Family

## 2022-06-12 DIAGNOSIS — I152 Hypertension secondary to endocrine disorders: Secondary | ICD-10-CM

## 2022-06-17 ENCOUNTER — Other Ambulatory Visit: Payer: Self-pay | Admitting: Family

## 2022-06-17 DIAGNOSIS — F411 Generalized anxiety disorder: Secondary | ICD-10-CM

## 2022-06-25 ENCOUNTER — Ambulatory Visit: Payer: Medicare PPO

## 2022-07-09 ENCOUNTER — Other Ambulatory Visit: Payer: Self-pay | Admitting: Family

## 2022-07-09 DIAGNOSIS — N3281 Overactive bladder: Secondary | ICD-10-CM

## 2022-07-22 ENCOUNTER — Other Ambulatory Visit: Payer: Self-pay | Admitting: Family

## 2022-07-22 DIAGNOSIS — I152 Hypertension secondary to endocrine disorders: Secondary | ICD-10-CM

## 2022-09-19 ENCOUNTER — Other Ambulatory Visit: Payer: Self-pay | Admitting: Family

## 2022-09-19 DIAGNOSIS — F411 Generalized anxiety disorder: Secondary | ICD-10-CM

## 2022-09-25 DIAGNOSIS — R42 Dizziness and giddiness: Secondary | ICD-10-CM | POA: Diagnosis not present

## 2022-09-25 DIAGNOSIS — R2681 Unsteadiness on feet: Secondary | ICD-10-CM | POA: Diagnosis not present

## 2022-10-01 DIAGNOSIS — R42 Dizziness and giddiness: Secondary | ICD-10-CM | POA: Diagnosis not present

## 2022-10-01 DIAGNOSIS — R2681 Unsteadiness on feet: Secondary | ICD-10-CM | POA: Diagnosis not present

## 2022-10-07 ENCOUNTER — Other Ambulatory Visit: Payer: Self-pay | Admitting: Family

## 2022-10-07 DIAGNOSIS — N3281 Overactive bladder: Secondary | ICD-10-CM

## 2022-10-15 ENCOUNTER — Other Ambulatory Visit: Payer: Self-pay | Admitting: Family

## 2022-10-15 DIAGNOSIS — R2681 Unsteadiness on feet: Secondary | ICD-10-CM | POA: Diagnosis not present

## 2022-10-15 DIAGNOSIS — G4700 Insomnia, unspecified: Secondary | ICD-10-CM

## 2022-10-15 DIAGNOSIS — R42 Dizziness and giddiness: Secondary | ICD-10-CM | POA: Diagnosis not present

## 2022-10-30 DIAGNOSIS — R2681 Unsteadiness on feet: Secondary | ICD-10-CM | POA: Diagnosis not present

## 2022-10-30 DIAGNOSIS — R42 Dizziness and giddiness: Secondary | ICD-10-CM | POA: Diagnosis not present

## 2022-10-31 ENCOUNTER — Encounter: Payer: Self-pay | Admitting: Family

## 2022-10-31 ENCOUNTER — Ambulatory Visit: Payer: Medicare PPO | Admitting: Family

## 2022-10-31 VITALS — BP 134/80 | HR 65 | Temp 97.1°F | Resp 20 | Ht 67.0 in | Wt 213.0 lb

## 2022-10-31 DIAGNOSIS — K5900 Constipation, unspecified: Secondary | ICD-10-CM | POA: Diagnosis not present

## 2022-10-31 DIAGNOSIS — F411 Generalized anxiety disorder: Secondary | ICD-10-CM

## 2022-10-31 DIAGNOSIS — Z0000 Encounter for general adult medical examination without abnormal findings: Secondary | ICD-10-CM

## 2022-10-31 DIAGNOSIS — G4700 Insomnia, unspecified: Secondary | ICD-10-CM | POA: Diagnosis not present

## 2022-10-31 DIAGNOSIS — I152 Hypertension secondary to endocrine disorders: Secondary | ICD-10-CM

## 2022-10-31 DIAGNOSIS — Z0001 Encounter for general adult medical examination with abnormal findings: Secondary | ICD-10-CM

## 2022-10-31 DIAGNOSIS — Z79899 Other long term (current) drug therapy: Secondary | ICD-10-CM

## 2022-10-31 DIAGNOSIS — E559 Vitamin D deficiency, unspecified: Secondary | ICD-10-CM

## 2022-10-31 DIAGNOSIS — E1159 Type 2 diabetes mellitus with other circulatory complications: Secondary | ICD-10-CM | POA: Diagnosis not present

## 2022-10-31 DIAGNOSIS — E785 Hyperlipidemia, unspecified: Secondary | ICD-10-CM

## 2022-10-31 DIAGNOSIS — N3281 Overactive bladder: Secondary | ICD-10-CM

## 2022-10-31 DIAGNOSIS — Z6833 Body mass index (BMI) 33.0-33.9, adult: Secondary | ICD-10-CM

## 2022-10-31 DIAGNOSIS — E1169 Type 2 diabetes mellitus with other specified complication: Secondary | ICD-10-CM

## 2022-10-31 DIAGNOSIS — E669 Obesity, unspecified: Secondary | ICD-10-CM | POA: Diagnosis not present

## 2022-10-31 LAB — LIPID PANEL

## 2022-10-31 LAB — BAYER DCA HB A1C WAIVED: HB A1C (BAYER DCA - WAIVED): 6.7 % — ABNORMAL HIGH (ref 4.8–5.6)

## 2022-10-31 MED ORDER — ATORVASTATIN CALCIUM 20 MG PO TABS: 20.0000 mg | ORAL_TABLET | Freq: Every day | ORAL | 1 refills | Status: DC

## 2022-10-31 MED ORDER — ZOLPIDEM TARTRATE 10 MG PO TABS: 10.0000 mg | ORAL_TABLET | Freq: Every day | ORAL | 2 refills | Status: DC

## 2022-10-31 MED ORDER — METOPROLOL SUCCINATE ER 100 MG PO TB24: 100.0000 mg | ORAL_TABLET | Freq: Two times a day (BID) | ORAL | 1 refills | Status: DC

## 2022-10-31 NOTE — Patient Instructions (Signed)
Health Maintenance After Age 81 After age 81, you are at a higher risk for certain long-term diseases and infections as well as injuries from falls. Falls are a major cause of broken bones and head injuries in people who are older than age 81. Getting regular preventive care can help to keep you healthy and well. Preventive care includes getting regular testing and making lifestyle changes as recommended by your health care provider. Talk with your health care provider about: Which screenings and tests you should have. A screening is a test that checks for a disease when you have no symptoms. A diet and exercise plan that is right for you. What should I know about screenings and tests to prevent falls? Screening and testing are the best ways to find a health problem early. Early diagnosis and treatment give you the best chance of managing medical conditions that are common after age 81. Certain conditions and lifestyle choices may make you more likely to have a fall. Your health care provider may recommend: Regular vision checks. Poor vision and conditions such as cataracts can make you more likely to have a fall. If you wear glasses, make sure to get your prescription updated if your vision changes. Medicine review. Work with your health care provider to regularly review all of the medicines you are taking, including over-the-counter medicines. Ask your health care provider about any side effects that may make you more likely to have a fall. Tell your health care provider if any medicines that you take make you feel dizzy or sleepy. Strength and balance checks. Your health care provider may recommend certain tests to check your strength and balance while standing, walking, or changing positions. Foot health exam. Foot pain and numbness, as well as not wearing proper footwear, can make you more likely to have a fall. Screenings, including: Osteoporosis screening. Osteoporosis is a condition that causes  the bones to get weaker and break more easily. Blood pressure screening. Blood pressure changes and medicines to control blood pressure can make you feel dizzy. Depression screening. You may be more likely to have a fall if you have a fear of falling, feel depressed, or feel unable to do activities that you used to do. Alcohol use screening. Using too much alcohol can affect your balance and may make you more likely to have a fall. Follow these instructions at home: Lifestyle Do not drink alcohol if: Your health care provider tells you not to drink. If you drink alcohol: Limit how much you have to: 0-1 drink a day for women. 0-2 drinks a day for men. Know how much alcohol is in your drink. In the [place] one drink equals one 12 oz bottle of beer (355 mL), one 5 oz glass of wine (148 mL), or one 1 oz glass of hard liquor (44 mL). Do not use any products that contain nicotine or tobacco. These products include cigarettes, chewing tobacco, and vaping devices, such as e-cigarettes. If you need help quitting, ask your health care provider. Activity  Follow a regular exercise program to stay fit. This will help you maintain your balance. Ask your health care provider what types of exercise are appropriate for you. If you need a cane or walker, use it as recommended by your health care provider. Wear supportive shoes that have nonskid soles. Safety  Remove any tripping hazards, such as rugs, cords, and clutter. Install safety equipment such as grab bars in bathrooms and safety rails on stairs. Keep rooms and walkways   well-lit. General instructions Talk with your health care provider about your risks for falling. Tell your health care provider if: You fall. Be sure to tell your health care provider about all falls, even ones that seem minor. You feel dizzy, tiredness (fatigue), or off-balance. Take over-the-counter and prescription medicines only as told by your health care provider. These include  supplements. Eat a healthy diet and maintain a healthy weight. A healthy diet includes low-fat dairy products, low-fat (lean) meats, and fiber from whole grains, beans, and lots of fruits and vegetables. Stay current with your vaccines. Schedule regular health, dental, and eye exams. Summary Having a healthy lifestyle and getting preventive care can help to protect your health and wellness after age 81. Screening and testing are the best way to find a health problem early and help you avoid having a fall. Early diagnosis and treatment give you the best chance for managing medical conditions that are more common for people who are older than age 81. Falls are a major cause of broken bones and head injuries in people who are older than age 81. Take precautions to prevent a fall at home. Work with your health care provider to learn what changes you can make to improve your health and wellness and to prevent falls. This information is not intended to replace advice given to you by your health care provider. Make sure you discuss any questions you have with your health care provider. Document Revised: 01/22/2021 Document Reviewed: 01/22/2021 Elsevier Patient Education  2023 Elsevier Inc.  

## 2022-10-31 NOTE — Progress Notes (Signed)
Subjective:    Patient ID: Jeanette Daniels, female    DOB: Mar 03, 1941, 82 y.o.   MRN: [id number]  Chief Complaint  Patient presents with   Medical Management of Chronic Issues   Pt presents to the office today for CPE and chronic follow up. She is retired now and enjoying life.    She has hx of left breast cancer in 2006 and is followed by Oncologists annually.    She has osteopenia, her last dexa scan was 05/11/18. Hypertension This is a chronic problem. The current episode started more than 1 year ago. The problem has been resolved since onset. The problem is controlled. Associated symptoms include anxiety. Pertinent negatives include no blurred vision, malaise/fatigue, peripheral edema or shortness of breath. Risk factors for coronary artery disease include dyslipidemia, diabetes mellitus and sedentary lifestyle. The current treatment provides moderate improvement. There is no history of heart failure.  Hyperlipidemia This is a chronic problem. The current episode started more than 1 year ago. The problem is controlled. Recent lipid tests were reviewed and are normal. Exacerbating diseases include obesity. Pertinent negatives include no shortness of breath. Current antihyperlipidemic treatment includes statins. The current treatment provides moderate improvement of lipids. Risk factors for coronary artery disease include dyslipidemia, diabetes mellitus, hypertension, a sedentary lifestyle and post-menopausal.  Diabetes She presents for her follow-up diabetic visit. Hypoglycemia symptoms include nervousness/anxiousness. Pertinent negatives for diabetes include no blurred vision and no foot paresthesias. Symptoms are stable. Risk factors for coronary artery disease include dyslipidemia, hypertension, sedentary lifestyle and diabetes mellitus. She is following a generally healthy diet. Her overall blood glucose range is 130-140 mg/dl. An ACE inhibitor/angiotensin II receptor blocker is being taken.   Urinary Frequency  This is a chronic problem. The current episode started more than 1 year ago. The problem occurs intermittently. The problem has been waxing and waning. The pain is at a severity of 0/10. The patient is experiencing no pain. Associated symptoms include frequency. Treatments tried: oxybutynin.  Insomnia Primary symptoms: difficulty falling asleep, no malaise/fatigue.   The current episode started more than one year. The problem occurs intermittently. Past treatments include medication. The treatment provided moderate relief.  Anxiety Presents for follow-up visit. Symptoms include excessive worry, insomnia and nervous/anxious behavior. Patient reports no irritability or shortness of breath. Symptoms occur occasionally. The severity of symptoms is moderate.    Constipation This is a chronic problem. The current episode started more than 1 year ago. The problem has been waxing and waning since onset. She has tried diet changes for the symptoms. The treatment provided moderate relief.      Review of Systems  Constitutional:  Negative for irritability and malaise/fatigue.  Eyes:  Negative for blurred vision.  Respiratory:  Negative for shortness of breath.   Gastrointestinal:  Positive for constipation.  Genitourinary:  Positive for frequency.  Psychiatric/Behavioral:  The patient is nervous/anxious and has insomnia.   All other systems reviewed and are negative.  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  Tobacco Use   Smoking status: Never    Smokeless tobacco: Never  Vaping Use   Vaping Use: Never used  Substance and Sexual Activity   Alcohol use: No   Drug use: No   Sexual activity: Yes    Birth control/protection: Post-menopausal  Other Topics Concern   Not on file  Social History Narrative   Married with 2 children   Social Determinants of Health   Financial Resource Strain: Low Risk  (05/01/2022)   Overall Financial Resource Strain (CARDIA)    Difficulty of Paying Living Expenses: Not hard at all  Food Insecurity: No Food Insecurity (05/01/2022)   Hunger Vital Sign    Worried About Running Out of Food in the Last Year: Never true    Ran Out of Food in the Last Year: Never true  Transportation Needs: No Transportation Needs (05/01/2022)   PRAPARE - Hydrologist (Medical): No    Lack of Transportation (Non-Medical): No  Physical Activity: Insufficiently Active (05/01/2022)   Exercise Vital Sign    Days of Exercise per Week: 6 days    Minutes of Exercise per Session: 20 min  Stress: No Stress Concern Present (05/01/2022)   [place]    Feeling of Stress : Not at all  Social Connections: [place] (05/01/2022)   Social Connection and Isolation Panel [NHANES]    Frequency of Communication with Friends and Family: More than three times a week    Frequency of Social Gatherings with Friends and Family: More than three times a week    Attends Religious Services: More than 4 times per year    Active Member of [employer] or Organizations: Yes    Attends Music therapist: More than 4 times per year    Marital Status: Married       Objective:   Physical Exam Vitals reviewed.  Constitutional:      General: She is not in acute distress.    Appearance: She is well-developed.  HENT:     Head: Normocephalic and atraumatic.     Right Ear: Tympanic membrane normal.     Left Ear: Tympanic membrane normal.  Eyes:      Pupils: Pupils are equal, round, and reactive to light.  Neck:     Thyroid: No thyromegaly.  Cardiovascular:     Rate and Rhythm: Normal rate and regular rhythm.     Heart sounds: Normal heart sounds. No murmur heard. Pulmonary:     Effort: Pulmonary effort is normal. No respiratory distress.     Breath sounds: Normal breath sounds. No wheezing.  Abdominal:     General: Bowel sounds are normal. There is no distension.     Palpations: Abdomen is soft.     Tenderness: There is no abdominal tenderness.  Musculoskeletal:        General: No tenderness. Normal range of motion.     Cervical back: Normal range of motion and neck supple.  Skin:    General: Skin is warm and dry.  Neurological:     Mental Status: She is alert and oriented to person, place, and time.     Cranial Nerves: No cranial nerve deficit.     Deep Tendon Reflexes: Reflexes are normal and symmetric.  Psychiatric:        Behavior: Behavior normal.        Thought Content: Thought content normal.  Judgment: Judgment normal.       BP 134/80   Pulse 65   Temp (!) 97.1 F (36.2 C) (Oral)   Resp 20   Ht 5' 7"$  (1.702 m)   Wt 213 lb (96.6 kg)   SpO2 95%   BMI 33.36 kg/m      Assessment & Plan:  BRAELYNNE QUAM comes in today with chief complaint of Medical Management of Chronic Issues   Diagnosis and orders addressed:  1. Hypertension associated with diabetes ([place]) - CBC with Differential/Platelet - CMP14+EGFR - Lipid panel - metoprolol succinate (TOPROL-XL) 100 MG 24 hr tablet; Take 1 tablet (100 mg total) by mouth 2 (two) times daily. Take with or immediately following a meal.  Dispense: 180 tablet; Refill: 1  2. Type 2 diabetes mellitus with other specified complication, without long-term current use of insulin (HCC) - Bayer DCA Hb A1c Waived; Future - CBC with Differential/Platelet - CMP14+EGFR - Lipid panel - Bayer DCA Hb A1c Waived  3. Hyperlipidemia associated with type 2 diabetes  mellitus (HCC) - CBC with Differential/Platelet - CMP14+EGFR - Lipid panel - atorvastatin (LIPITOR) 20 MG tablet; Take 1 tablet (20 mg total) by mouth daily.  Dispense: 90 tablet; Refill: 1  4. Insomnia, unspecified type - zolpidem (AMBIEN) 10 MG tablet; Take 1 tablet (10 mg total) by mouth at bedtime.  Dispense: 30 tablet; Refill: 2  5. GAD (generalized anxiety disorder)  6. Obesity (BMI 30-39.9)  7. Overactive bladder  8. Vitamin D deficiency  9. Constipation, unspecified constipation type  10. Controlled substance agreement signed  11. Annual physical exam - Bayer DCA Hb A1c Waived; Future - CBC with Differential/Platelet - CMP14+EGFR - Lipid panel - Bayer DCA Hb A1c Waived  Labs pending Patient reviewed in [place] controlled database, no flags noted. Contract and drug screen are up to date.  Continue current medications  Health Maintenance reviewed Diet and exercise encouraged  Follow up plan: 6 months    Evelina Dun, FNP

## 2022-11-01 ENCOUNTER — Other Ambulatory Visit: Payer: Self-pay | Admitting: Family

## 2022-11-01 LAB — CMP14+EGFR
ALT: 18 IU/L (ref 0–32)
AST: 21 IU/L (ref 0–40)
Albumin/Globulin Ratio: 1.6 (ref 1.2–2.2)
Albumin: 4.4 g/dL (ref 3.8–4.8)
Alkaline Phosphatase: 93 IU/L (ref 44–121)
BUN/Creatinine Ratio: 18 (ref 12–28)
BUN: 16 mg/dL (ref 8–27)
Bilirubin Total: 0.3 mg/dL (ref 0.0–1.2)
CO2: 19 mmol/L — ABNORMAL LOW (ref 20–29)
Calcium: 9.7 mg/dL (ref 8.7–10.3)
Chloride: 104 mmol/L (ref 96–106)
Creatinine, Ser: 0.87 mg/dL (ref 0.57–1.00)
Globulin, Total: 2.7 g/dL (ref 1.5–4.5)
Glucose: 184 mg/dL — ABNORMAL HIGH (ref 70–99)
Potassium: 4.4 mmol/L (ref 3.5–5.2)
Sodium: 139 mmol/L (ref 134–144)
Total Protein: 7.1 g/dL (ref 6.0–8.5)
eGFR: 67 mL/min/{1.73_m2} (ref >59)

## 2022-11-01 LAB — CBC WITH DIFFERENTIAL/PLATELET
Basophils Absolute: 0.1 10*3/uL (ref 0.0–0.2)
Basos: 1 %
EOS (ABSOLUTE): 0.1 10*3/uL (ref 0.0–0.4)
Eos: 2 %
Hematocrit: 41.8 % (ref 34.0–46.6)
Hemoglobin: 13.7 g/dL (ref 11.1–15.9)
Immature Grans (Abs): 0 10*3/uL (ref 0.0–0.1)
Immature Granulocytes: 0 %
Lymphocytes Absolute: 1.8 10*3/uL (ref 0.7–3.1)
Lymphs: 25 %
MCH: 29.7 pg (ref 26.6–33.0)
MCHC: 32.8 g/dL (ref 31.5–35.7)
MCV: 91 fL (ref 79–97)
Monocytes Absolute: 0.3 10*3/uL (ref 0.1–0.9)
Monocytes: 5 %
Neutrophils Absolute: 4.8 10*3/uL (ref 1.4–7.0)
Neutrophils: 67 %
Platelets: 220 10*3/uL (ref 150–450)
RBC: 4.61 x10E6/uL (ref 3.77–5.28)
RDW: 12.4 % (ref 11.7–15.4)
WBC: 7.1 10*3/uL (ref 3.4–10.8)

## 2022-11-01 LAB — LIPID PANEL
Chol/HDL Ratio: 3.5 ratio (ref 0.0–4.4)
Cholesterol, Total: 181 mg/dL (ref 100–199)
HDL: 51 mg/dL (ref >39)
LDL Chol Calc (NIH): 107 mg/dL — ABNORMAL HIGH (ref 0–99)
Triglycerides: 131 mg/dL (ref 0–149)
VLDL Cholesterol Cal: 23 mg/dL (ref 5–40)

## 2022-11-05 DIAGNOSIS — R2681 Unsteadiness on feet: Secondary | ICD-10-CM | POA: Diagnosis not present

## 2022-11-05 DIAGNOSIS — R42 Dizziness and giddiness: Secondary | ICD-10-CM | POA: Diagnosis not present

## 2022-11-19 DIAGNOSIS — R2681 Unsteadiness on feet: Secondary | ICD-10-CM | POA: Diagnosis not present

## 2022-11-19 DIAGNOSIS — R42 Dizziness and giddiness: Secondary | ICD-10-CM | POA: Diagnosis not present

## 2022-12-02 ENCOUNTER — Other Ambulatory Visit: Payer: Self-pay | Admitting: Family

## 2022-12-02 DIAGNOSIS — E1169 Type 2 diabetes mellitus with other specified complication: Secondary | ICD-10-CM

## 2023-01-06 ENCOUNTER — Other Ambulatory Visit: Payer: Self-pay | Admitting: Family

## 2023-01-06 DIAGNOSIS — N3281 Overactive bladder: Secondary | ICD-10-CM

## 2023-01-17 ENCOUNTER — Other Ambulatory Visit: Payer: Self-pay | Admitting: Family

## 2023-01-17 DIAGNOSIS — E1159 Type 2 diabetes mellitus with other circulatory complications: Secondary | ICD-10-CM

## 2023-01-27 ENCOUNTER — Telehealth: Payer: Self-pay | Admitting: Family

## 2023-01-27 NOTE — Telephone Encounter (Signed)
Contacted IRENE MACBETH to schedule their annual wellness visit. Appointment made for 05/05/2023.  Thank you,  Judeth Cornfield,  AMB Clinical Support [facility] AWV Program Direct Dial ??[phone]

## 2023-02-14 DIAGNOSIS — I10 Essential (primary) hypertension: Secondary | ICD-10-CM | POA: Diagnosis not present

## 2023-02-14 DIAGNOSIS — E119 Type 2 diabetes mellitus without complications: Secondary | ICD-10-CM | POA: Diagnosis not present

## 2023-02-14 DIAGNOSIS — H5203 Hypermetropia, bilateral: Secondary | ICD-10-CM | POA: Diagnosis not present

## 2023-02-14 DIAGNOSIS — H524 Presbyopia: Secondary | ICD-10-CM | POA: Diagnosis not present

## 2023-02-14 DIAGNOSIS — H25813 Combined forms of age-related cataract, bilateral: Secondary | ICD-10-CM | POA: Diagnosis not present

## 2023-02-14 DIAGNOSIS — D3131 Benign neoplasm of right choroid: Secondary | ICD-10-CM | POA: Diagnosis not present

## 2023-02-17 ENCOUNTER — Other Ambulatory Visit: Payer: Self-pay | Admitting: Family

## 2023-02-17 DIAGNOSIS — G4700 Insomnia, unspecified: Secondary | ICD-10-CM

## 2023-02-20 ENCOUNTER — Other Ambulatory Visit: Payer: Self-pay | Admitting: Family

## 2023-02-22 ENCOUNTER — Other Ambulatory Visit: Payer: Self-pay | Admitting: Family

## 2023-02-22 DIAGNOSIS — G4700 Insomnia, unspecified: Secondary | ICD-10-CM

## 2023-02-28 ENCOUNTER — Telehealth: Payer: Self-pay

## 2023-02-28 ENCOUNTER — Other Ambulatory Visit (HOSPITAL_COMMUNITY): Payer: Self-pay

## 2023-02-28 NOTE — Telephone Encounter (Signed)
Patient Advocate Encounter   Received notification from [facility] that prior authorization is required for Acyclovir 5% ointment   Submitted: n/a [id number]  PA not submitted at this time. Awaiting response from office.

## 2023-03-03 ENCOUNTER — Other Ambulatory Visit (HOSPITAL_COMMUNITY): Payer: Self-pay

## 2023-03-03 NOTE — Telephone Encounter (Signed)
Please ask patient why she is taking the acyclovir ointment for PA to be completed?

## 2023-03-03 NOTE — Telephone Encounter (Signed)
Patient has already picked up and paid cash

## 2023-03-06 ENCOUNTER — Other Ambulatory Visit: Payer: Self-pay | Admitting: Family

## 2023-03-06 DIAGNOSIS — E1169 Type 2 diabetes mellitus with other specified complication: Secondary | ICD-10-CM

## 2023-03-12 ENCOUNTER — Other Ambulatory Visit: Payer: Self-pay | Admitting: Family

## 2023-03-12 DIAGNOSIS — F411 Generalized anxiety disorder: Secondary | ICD-10-CM

## 2023-04-21 ENCOUNTER — Other Ambulatory Visit: Payer: Self-pay | Admitting: Family

## 2023-04-21 DIAGNOSIS — I152 Hypertension secondary to endocrine disorders: Secondary | ICD-10-CM

## 2023-04-25 ENCOUNTER — Other Ambulatory Visit: Payer: Self-pay | Admitting: Family

## 2023-04-25 DIAGNOSIS — G4700 Insomnia, unspecified: Secondary | ICD-10-CM

## 2023-04-30 ENCOUNTER — Other Ambulatory Visit: Payer: Self-pay | Admitting: Family

## 2023-04-30 DIAGNOSIS — G4700 Insomnia, unspecified: Secondary | ICD-10-CM

## 2023-05-01 ENCOUNTER — Ambulatory Visit: Payer: Medicare PPO | Admitting: Family

## 2023-05-01 ENCOUNTER — Encounter: Payer: Self-pay | Admitting: Family

## 2023-05-01 VITALS — Temp 97.7°F | Ht 67.0 in | Wt 213.4 lb

## 2023-05-01 DIAGNOSIS — E785 Hyperlipidemia, unspecified: Secondary | ICD-10-CM | POA: Diagnosis not present

## 2023-05-01 DIAGNOSIS — E1169 Type 2 diabetes mellitus with other specified complication: Secondary | ICD-10-CM

## 2023-05-01 DIAGNOSIS — I152 Hypertension secondary to endocrine disorders: Secondary | ICD-10-CM

## 2023-05-01 DIAGNOSIS — K5900 Constipation, unspecified: Secondary | ICD-10-CM | POA: Diagnosis not present

## 2023-05-01 DIAGNOSIS — G4700 Insomnia, unspecified: Secondary | ICD-10-CM | POA: Diagnosis not present

## 2023-05-01 DIAGNOSIS — F411 Generalized anxiety disorder: Secondary | ICD-10-CM | POA: Diagnosis not present

## 2023-05-01 DIAGNOSIS — R42 Dizziness and giddiness: Secondary | ICD-10-CM | POA: Diagnosis not present

## 2023-05-01 DIAGNOSIS — Z79899 Other long term (current) drug therapy: Secondary | ICD-10-CM | POA: Diagnosis not present

## 2023-05-01 DIAGNOSIS — N3281 Overactive bladder: Secondary | ICD-10-CM | POA: Diagnosis not present

## 2023-05-01 DIAGNOSIS — E1159 Type 2 diabetes mellitus with other circulatory complications: Secondary | ICD-10-CM | POA: Diagnosis not present

## 2023-05-01 DIAGNOSIS — E669 Obesity, unspecified: Secondary | ICD-10-CM

## 2023-05-01 DIAGNOSIS — M8580 Other specified disorders of bone density and structure, unspecified site: Secondary | ICD-10-CM

## 2023-05-01 DIAGNOSIS — E559 Vitamin D deficiency, unspecified: Secondary | ICD-10-CM

## 2023-05-01 LAB — BAYER DCA HB A1C WAIVED: HB A1C (BAYER DCA - WAIVED): 6.3 % — ABNORMAL HIGH (ref 4.8–5.6)

## 2023-05-01 MED ORDER — METOPROLOL SUCCINATE ER 100 MG PO TB24
100.0000 mg | ORAL_TABLET | Freq: Two times a day (BID) | ORAL | 1 refills | Status: DC
Start: 2023-05-01 — End: 2023-09-15

## 2023-05-01 MED ORDER — ZOLPIDEM TARTRATE 10 MG PO TABS
10.0000 mg | ORAL_TABLET | Freq: Every day | ORAL | 1 refills | Status: DC
Start: 2023-05-01 — End: 2023-11-10

## 2023-05-01 MED ORDER — MECLIZINE HCL 25 MG PO TABS
25.0000 mg | ORAL_TABLET | Freq: Three times a day (TID) | ORAL | 2 refills | Status: DC | PRN
Start: 2023-05-01 — End: 2023-11-10

## 2023-05-01 MED ORDER — OXYBUTYNIN CHLORIDE ER 10 MG PO TB24
10.0000 mg | ORAL_TABLET | Freq: Every day | ORAL | 1 refills | Status: DC
Start: 2023-05-01 — End: 2023-11-10

## 2023-05-01 MED ORDER — ATORVASTATIN CALCIUM 20 MG PO TABS
20.0000 mg | ORAL_TABLET | Freq: Every day | ORAL | 0 refills | Status: DC
Start: 2023-05-01 — End: 2023-06-03

## 2023-05-01 MED ORDER — SERTRALINE HCL 100 MG PO TABS
200.0000 mg | ORAL_TABLET | Freq: Every day | ORAL | 0 refills | Status: DC
Start: 2023-05-01 — End: 2023-09-15

## 2023-05-01 MED ORDER — LOSARTAN POTASSIUM 50 MG PO TABS
100.0000 mg | ORAL_TABLET | Freq: Every day | ORAL | 0 refills | Status: DC
Start: 2023-05-01 — End: 2023-11-10

## 2023-05-01 NOTE — Progress Notes (Signed)
Subjective:    Patient ID: Jeanette Daniels, female    DOB: 07-28-1941, 82 y.o.   MRN: [id number]  Chief Complaint  Patient presents with   Medical Management of Chronic Issues   Pt presents to the office today for chronic follow up. She is retired now and enjoying life.    She has hx of left breast cancer in 2006 and is followed by Oncologists as needed.    She has osteopenia, her last dexa scan was 05/11/18. Hypertension This is a chronic problem. The current episode started more than 1 year ago. The problem has been resolved since onset. The problem is controlled. Associated symptoms include anxiety. Pertinent negatives include no blurred vision, malaise/fatigue, peripheral edema or shortness of breath. Risk factors for coronary artery disease include dyslipidemia, obesity and sedentary lifestyle. The current treatment provides moderate improvement.  Constipation This is a chronic problem. The current episode started more than 1 year ago. The problem has been resolved since onset. Risk factors include obesity. The treatment provided moderate relief.  Hyperlipidemia This is a chronic problem. The current episode started more than 1 year ago. Exacerbating diseases include obesity. Pertinent negatives include no shortness of breath. Current antihyperlipidemic treatment includes statins. The current treatment provides moderate improvement of lipids. Risk factors for coronary artery disease include dyslipidemia, hypertension, a sedentary lifestyle and post-menopausal.  Diabetes She presents for her follow-up diabetic visit. She has type 2 diabetes mellitus. Hypoglycemia symptoms include nervousness/anxiousness. Pertinent negatives for diabetes include no blurred vision and no foot paresthesias. Symptoms are stable. Risk factors for coronary artery disease include dyslipidemia, diabetes mellitus, hypertension, sedentary lifestyle and post-menopausal. Her weight is stable. She is following a generally  healthy diet. Her overall blood glucose range is 90-110 mg/dl.  Urinary Frequency  This is a chronic problem. The current episode started more than 1 year ago. The patient is experiencing no pain. Associated symptoms include frequency. Treatments tried: oxybutynin.  Insomnia Primary symptoms: difficulty falling asleep, frequent awakening, no malaise/fatigue.   The current episode started more than one year. The onset quality is gradual. The problem occurs intermittently. Past treatments include medication. The treatment provided mild relief.  Anxiety Presents for follow-up visit. Symptoms include excessive worry, insomnia and nervous/anxious behavior. Patient reports no shortness of breath. Symptoms occur occasionally. The severity of symptoms is moderate.        Review of Systems  Constitutional:  Negative for malaise/fatigue.  Eyes:  Negative for blurred vision.  Respiratory:  Negative for shortness of breath.   Gastrointestinal:  Positive for constipation.  Genitourinary:  Positive for frequency.  Psychiatric/Behavioral:  The patient is nervous/anxious and has insomnia.   All other systems reviewed and are negative.      Objective:   Physical Exam Vitals reviewed.  Constitutional:      General: She is not in acute distress.    Appearance: She is well-developed. She is obese.  HENT:     Head: Normocephalic and atraumatic.     Right Ear: Tympanic membrane normal.     Left Ear: Tympanic membrane normal.  Eyes:     Pupils: Pupils are equal, round, and reactive to light.  Neck:     Thyroid: No thyromegaly.  Cardiovascular:     Rate and Rhythm: Normal rate and regular rhythm.     Heart sounds: Normal heart sounds. No murmur heard. Pulmonary:     Effort: Pulmonary effort is normal. No respiratory distress.     Breath sounds: Normal breath sounds.  No wheezing.  Abdominal:     General: Bowel sounds are normal. There is no distension.     Palpations: Abdomen is soft.      Tenderness: There is no abdominal tenderness.  Musculoskeletal:        General: No tenderness. Normal range of motion.     Cervical back: Normal range of motion and neck supple.  Skin:    General: Skin is warm and dry.  Neurological:     Mental Status: She is alert and oriented to person, place, and time.     Cranial Nerves: No cranial nerve deficit.     Deep Tendon Reflexes: Reflexes are normal and symmetric.  Psychiatric:        Behavior: Behavior normal.        Thought Content: Thought content normal.        Judgment: Judgment normal.      Temp 97.7 F (36.5 C) (Temporal)   Ht 5\' 7"  (1.702 m)   Wt 213 lb 6.4 oz (96.8 kg)   SpO2 97%   BMI 33.42 kg/m      Assessment & Plan:  Jeanette Daniels comes in today with chief complaint of Medical Management of Chronic Issues   Diagnosis and orders addressed:  1. Hyperlipidemia associated with type 2 diabetes mellitus (HCC) - atorvastatin (LIPITOR) 20 MG tablet; Take 1 tablet (20 mg total) by mouth daily.  Dispense: 90 tablet; Refill: 0 - CBC with Differential/Platelet - CMP14+EGFR  2. Hypertension associated with diabetes (HCC) - losartan (COZAAR) 50 MG tablet; Take 2 tablets (100 mg total) by mouth daily.  Dispense: 180 tablet; Refill: 0 - metoprolol succinate (TOPROL-XL) 100 MG 24 hr tablet; Take 1 tablet (100 mg total) by mouth 2 (two) times daily. Take with or immediately following a meal.  Dispense: 180 tablet; Refill: 1 - CBC with Differential/Platelet - CMP14+EGFR  3. Dizziness - meclizine (ANTIVERT) 25 MG tablet; Take 1 tablet (25 mg total) by mouth 3 (three) times daily as needed for dizziness.  Dispense: 30 tablet; Refill: 2 - CBC with Differential/Platelet - CMP14+EGFR  4. Overactive bladder - oxybutynin (DITROPAN-XL) 10 MG 24 hr tablet; Take 1 tablet (10 mg total) by mouth at bedtime.  Dispense: 90 tablet; Refill: 1 - CBC with Differential/Platelet - CMP14+EGFR  5. GAD (generalized anxiety disorder) - sertraline  (ZOLOFT) 100 MG tablet; Take 2 tablets (200 mg total) by mouth daily.  Dispense: 180 tablet; Refill: 0 - CBC with Differential/Platelet - CMP14+EGFR  6. Insomnia, unspecified type - zolpidem (AMBIEN) 10 MG tablet; Take 1 tablet (10 mg total) by mouth at bedtime.  Dispense: 90 tablet; Refill: 1 - CBC with Differential/Platelet - CMP14+EGFR  7. Constipation, unspecified constipation type - CBC with Differential/Platelet - CMP14+EGFR  8. Type 2 diabetes mellitus with other specified complication, without long-term current use of insulin (HCC)  - Bayer DCA Hb A1c Waived - CBC with Differential/Platelet - CMP14+EGFR - Microalbumin / creatinine urine ratio  9. Controlled substance agreement signed - CBC with Differential/Platelet - CMP14+EGFR  10. Obesity (BMI 30-39.9) - CBC with Differential/Platelet - CMP14+EGFR  11. Osteopenia, unspecified location - CBC with Differential/Platelet - CMP14+EGFR  12. Vitamin D deficiency - CBC with Differential/Platelet - CMP14+EGFR   Labs pending Patient reviewed in [place] controlled database, no flags noted. Contract and drug screen are up to date.  Continue medications  Health Maintenance reviewed Diet and exercise encouraged  Follow up plan: 6 months    Jannifer Rodney, FNP

## 2023-05-01 NOTE — Patient Instructions (Signed)
Health Maintenance After Age 81 After age 81, you are at a higher risk for certain long-term diseases and infections as well as injuries from falls. Falls are a major cause of broken bones and head injuries in people who are older than age 81. Getting regular preventive care can help to keep you healthy and well. Preventive care includes getting regular testing and making lifestyle changes as recommended by your health care provider. Talk with your health care provider about: Which screenings and tests you should have. A screening is a test that checks for a disease when you have no symptoms. A diet and exercise plan that is right for you. What should I know about screenings and tests to prevent falls? Screening and testing are the best ways to find a health problem early. Early diagnosis and treatment give you the best chance of managing medical conditions that are common after age 81. Certain conditions and lifestyle choices may make you more likely to have a fall. Your health care provider may recommend: Regular vision checks. Poor vision and conditions such as cataracts can make you more likely to have a fall. If you wear glasses, make sure to get your prescription updated if your vision changes. Medicine review. Work with your health care provider to regularly review all of the medicines you are taking, including over-the-counter medicines. Ask your health care provider about any side effects that may make you more likely to have a fall. Tell your health care provider if any medicines that you take make you feel dizzy or sleepy. Strength and balance checks. Your health care provider may recommend certain tests to check your strength and balance while standing, walking, or changing positions. Foot health exam. Foot pain and numbness, as well as not wearing proper footwear, can make you more likely to have a fall. Screenings, including: Osteoporosis screening. Osteoporosis is a condition that causes  the bones to get weaker and break more easily. Blood pressure screening. Blood pressure changes and medicines to control blood pressure can make you feel dizzy. Depression screening. You may be more likely to have a fall if you have a fear of falling, feel depressed, or feel unable to do activities that you used to do. Alcohol use screening. Using too much alcohol can affect your balance and may make you more likely to have a fall. Follow these instructions at home: Lifestyle Do not drink alcohol if: Your health care provider tells you not to drink. If you drink alcohol: Limit how much you have to: 0-1 drink a day for women. 0-2 drinks a day for men. Know how much alcohol is in your drink. In the [place] one drink equals one 12 oz bottle of beer (355 mL), one 5 oz glass of wine (148 mL), or one 1 oz glass of hard liquor (44 mL). Do not use any products that contain nicotine or tobacco. These products include cigarettes, chewing tobacco, and vaping devices, such as e-cigarettes. If you need help quitting, ask your health care provider. Activity  Follow a regular exercise program to stay fit. This will help you maintain your balance. Ask your health care provider what types of exercise are appropriate for you. If you need a cane or walker, use it as recommended by your health care provider. Wear supportive shoes that have nonskid soles. Safety  Remove any tripping hazards, such as rugs, cords, and clutter. Install safety equipment such as grab bars in bathrooms and safety rails on stairs. Keep rooms and walkways   well-lit. General instructions Talk with your health care provider about your risks for falling. Tell your health care provider if: You fall. Be sure to tell your health care provider about all falls, even ones that seem minor. You feel dizzy, tiredness (fatigue), or off-balance. Take over-the-counter and prescription medicines only as told by your health care provider. These include  supplements. Eat a healthy diet and maintain a healthy weight. A healthy diet includes low-fat dairy products, low-fat (lean) meats, and fiber from whole grains, beans, and lots of fruits and vegetables. Stay current with your vaccines. Schedule regular health, dental, and eye exams. Summary Having a healthy lifestyle and getting preventive care can help to protect your health and wellness after age 81. Screening and testing are the best way to find a health problem early and help you avoid having a fall. Early diagnosis and treatment give you the best chance for managing medical conditions that are more common for people who are older than age 81. Falls are a major cause of broken bones and head injuries in people who are older than age 81. Take precautions to prevent a fall at home. Work with your health care provider to learn what changes you can make to improve your health and wellness and to prevent falls. This information is not intended to replace advice given to you by your health care provider. Make sure you discuss any questions you have with your health care provider. Document Revised: 01/22/2021 Document Reviewed: 01/22/2021 Elsevier Patient Education  2024 [employer]  

## 2023-05-02 LAB — CBC WITH DIFFERENTIAL/PLATELET
Basophils Absolute: 0.1 10*3/uL (ref 0.0–0.2)
Basos: 1 %
EOS (ABSOLUTE): 0.1 10*3/uL (ref 0.0–0.4)
Eos: 2 %
Hematocrit: 40.9 % (ref 34.0–46.6)
Hemoglobin: 13.7 g/dL (ref 11.1–15.9)
Immature Grans (Abs): 0 10*3/uL (ref 0.0–0.1)
Immature Granulocytes: 1 %
Lymphocytes Absolute: 2.1 10*3/uL (ref 0.7–3.1)
Lymphs: 26 %
MCH: 30.6 pg (ref 26.6–33.0)
MCHC: 33.5 g/dL (ref 31.5–35.7)
MCV: 91 fL (ref 79–97)
Monocytes Absolute: 0.5 10*3/uL (ref 0.1–0.9)
Monocytes: 7 %
Neutrophils Absolute: 5.3 10*3/uL (ref 1.4–7.0)
Neutrophils: 63 %
Platelets: 209 10*3/uL (ref 150–450)
RBC: 4.48 x10E6/uL (ref 3.77–5.28)
RDW: 12.3 % (ref 11.7–15.4)
WBC: 8.1 10*3/uL (ref 3.4–10.8)

## 2023-05-02 LAB — CMP14+EGFR
ALT: 14 IU/L (ref 0–32)
AST: 16 IU/L (ref 0–40)
Albumin: 4.4 g/dL (ref 3.7–4.7)
Alkaline Phosphatase: 82 IU/L (ref 44–121)
BUN/Creatinine Ratio: 24 (ref 12–28)
BUN: 18 mg/dL (ref 8–27)
Bilirubin Total: 0.5 mg/dL (ref 0.0–1.2)
CO2: 24 mmol/L (ref 20–29)
Calcium: 10.2 mg/dL (ref 8.7–10.3)
Chloride: 101 mmol/L (ref 96–106)
Creatinine, Ser: 0.75 mg/dL (ref 0.57–1.00)
Globulin, Total: 2.6 g/dL (ref 1.5–4.5)
Glucose: 97 mg/dL (ref 70–99)
Potassium: 5.2 mmol/L (ref 3.5–5.2)
Sodium: 140 mmol/L (ref 134–144)
Total Protein: 7 g/dL (ref 6.0–8.5)
eGFR: 80 mL/min/{1.73_m2} (ref >59)

## 2023-05-02 LAB — MICROALBUMIN / CREATININE URINE RATIO
Creatinine, Urine: 110.3 mg/dL
Microalb/Creat Ratio: 10 mg/g{creat} (ref 0–29)
Microalbumin, Urine: 11.4 ug/mL

## 2023-05-05 ENCOUNTER — Ambulatory Visit (INDEPENDENT_AMBULATORY_CARE_PROVIDER_SITE_OTHER): Payer: Medicare PPO

## 2023-05-05 VITALS — Ht 67.0 in | Wt 213.0 lb

## 2023-05-05 DIAGNOSIS — Z0000 Encounter for general adult medical examination without abnormal findings: Secondary | ICD-10-CM | POA: Diagnosis not present

## 2023-05-05 DIAGNOSIS — Z780 Asymptomatic menopausal state: Secondary | ICD-10-CM

## 2023-05-05 NOTE — Patient Instructions (Signed)
[name] , Thank you for taking time to come for your Medicare Wellness Visit. I appreciate your ongoing commitment to your health goals. Please review the following plan we discussed and let me know if I can assist you in the future.   Referrals/Orders/Follow-Ups/Clinician Recommendations: Aim for 30 minutes of exercise or brisk walking, 6-8 glasses of water, and 5 servings of fruits and vegetables each day.   This is a list of the screening recommended for you and due dates:  Health Maintenance  Topic Date Due   DEXA scan (bone density measurement)  05/11/2020   COVID-19 Vaccine (4 - 2023-24 season) 09/10/2022   Eye exam for diabetics  02/08/2023   Flu Shot  12/15/2023*   Hemoglobin A1C  11/01/2023   Yearly kidney function blood test for diabetes  04/30/2024   Yearly kidney health urinalysis for diabetes  04/30/2024   Complete foot exam   04/30/2024   Medicare Annual Wellness Visit  05/04/2024   DTaP/Tdap/Td vaccine (4 - Td or Tdap) 04/28/2031   Pneumonia Vaccine  Completed   Zoster (Shingles) Vaccine  Completed   HPV Vaccine  Aged Out   Hepatitis C Screening  Discontinued  *Topic was postponed. The date shown is not the original due date.    Advanced directives: (Copy Requested) Please bring a copy of your health care power of attorney and living will to the office to be added to your chart at your convenience.  Next Medicare Annual Wellness Visit scheduled for next year: Yes  Preventive Care 81 Years and Older, Female Preventive care refers to lifestyle choices and visits with your health care provider that can promote health and wellness. What does preventive care include? A yearly physical exam. This is also called an annual well check. Dental exams once or twice a year. Routine eye exams. Ask your health care provider how often you should have your eyes checked. Personal lifestyle choices, including: Daily care of your teeth and gums. Regular physical activity. Eating a  healthy diet. Avoiding tobacco and drug use. Limiting alcohol use. Practicing safe sex. Taking low-dose aspirin every day. Taking vitamin and mineral supplements as recommended by your health care provider. What happens during an annual well check? The services and screenings done by your health care provider during your annual well check will depend on your age, overall health, lifestyle risk factors, and family history of disease. Counseling  Your health care provider may ask you questions about your: Alcohol use. Tobacco use. Drug use. Emotional well-being. Home and relationship well-being. Sexual activity. Eating habits. History of falls. Memory and ability to understand (cognition). Work and work Astronomer. Reproductive health. Screening  You may have the following tests or measurements: Height, weight, and BMI. Blood pressure. Lipid and cholesterol levels. These may be checked every 5 years, or more frequently if you are over 25 years old. Skin check. Lung cancer screening. You may have this screening every year starting at age 19 if you have a 30-pack-year history of smoking and currently smoke or have quit within the past 15 years. Fecal occult blood test (FOBT) of the stool. You may have this test every year starting at age 51. Flexible sigmoidoscopy or colonoscopy. You may have a sigmoidoscopy every 5 years or a colonoscopy every 10 years starting at age 14. Hepatitis C blood test. Hepatitis B blood test. Sexually transmitted disease (STD) testing. Diabetes screening. This is done by checking your blood sugar (glucose) after you have not eaten for a while (fasting).  You may have this done every 1-3 years. Bone density scan. This is done to screen for osteoporosis. You may have this done starting at age 46. Mammogram. This may be done every 1-2 years. Talk to your health care provider about how often you should have regular mammograms. Talk with your health care  provider about your test results, treatment options, and if necessary, the need for more tests. Vaccines  Your health care provider may recommend certain vaccines, such as: Influenza vaccine. This is recommended every year. Tetanus, diphtheria, and acellular pertussis (Tdap, Td) vaccine. You may need a Td booster every 10 years. Zoster vaccine. You may need this after age 47. Pneumococcal 13-valent conjugate (PCV13) vaccine. One dose is recommended after age 65. Pneumococcal polysaccharide (PPSV23) vaccine. One dose is recommended after age 16. Talk to your health care provider about which screenings and vaccines you need and how often you need them. This information is not intended to replace advice given to you by your health care provider. Make sure you discuss any questions you have with your health care provider. Document Released: 09/29/2015 Document Revised: 05/22/2016 Document Reviewed: 07/04/2015 Elsevier Interactive Patient Education  2017 [employer].  Fall Prevention in the Home Falls can cause injuries. They can happen to people of all ages. There are many things you can do to make your home safe and to help prevent falls. What can I do on the outside of my home? Regularly fix the edges of walkways and driveways and fix any cracks. Remove anything that might make you trip as you walk through a door, such as a raised step or threshold. Trim any bushes or trees on the path to your home. Use bright outdoor lighting. Clear any walking paths of anything that might make someone trip, such as rocks or tools. Regularly check to see if handrails are loose or broken. Make sure that both sides of any steps have handrails. Any raised decks and porches should have guardrails on the edges. Have any leaves, snow, or ice cleared regularly. Use sand or salt on walking paths during winter. Clean up any spills in your garage right away. This includes oil or grease spills. What can I do in the  bathroom? Use night lights. Install grab bars by the toilet and in the tub and shower. Do not use towel bars as grab bars. Use non-skid mats or decals in the tub or shower. If you need to sit down in the shower, use a plastic, non-slip stool. Keep the floor dry. Clean up any water that spills on the floor as soon as it happens. Remove soap buildup in the tub or shower regularly. Attach bath mats securely with double-sided non-slip rug tape. Do not have throw rugs and other things on the floor that can make you trip. What can I do in the bedroom? Use night lights. Make sure that you have a light by your bed that is easy to reach. Do not use any sheets or blankets that are too big for your bed. They should not hang down onto the floor. Have a firm chair that has side arms. You can use this for support while you get dressed. Do not have throw rugs and other things on the floor that can make you trip. What can I do in the kitchen? Clean up any spills right away. Avoid walking on wet floors. Keep items that you use a lot in easy-to-reach places. If you need to reach something above you, use a  strong step stool that has a grab bar. Keep electrical cords out of the way. Do not use floor polish or wax that makes floors slippery. If you must use wax, use non-skid floor wax. Do not have throw rugs and other things on the floor that can make you trip. What can I do with my stairs? Do not leave any items on the stairs. Make sure that there are handrails on both sides of the stairs and use them. Fix handrails that are broken or loose. Make sure that handrails are as long as the stairways. Check any carpeting to make sure that it is firmly attached to the stairs. Fix any carpet that is loose or worn. Avoid having throw rugs at the top or bottom of the stairs. If you do have throw rugs, attach them to the floor with carpet tape. Make sure that you have a light switch at the top of the stairs and the  bottom of the stairs. If you do not have them, ask someone to add them for you. What else can I do to help prevent falls? Wear shoes that: Do not have high heels. Have rubber bottoms. Are comfortable and fit you well. Are closed at the toe. Do not wear sandals. If you use a stepladder: Make sure that it is fully opened. Do not climb a closed stepladder. Make sure that both sides of the stepladder are locked into place. Ask someone to hold it for you, if possible. Clearly mark and make sure that you can see: Any grab bars or handrails. First and last steps. Where the edge of each step is. Use tools that help you move around (mobility aids) if they are needed. These include: Canes. Walkers. Scooters. Crutches. Turn on the lights when you go into a dark area. Replace any light bulbs as soon as they burn out. Set up your furniture so you have a clear path. Avoid moving your furniture around. If any of your floors are uneven, fix them. If there are any pets around you, be aware of where they are. Review your medicines with your doctor. Some medicines can make you feel dizzy. This can increase your chance of falling. Ask your doctor what other things that you can do to help prevent falls. This information is not intended to replace advice given to you by your health care provider. Make sure you discuss any questions you have with your health care provider. Document Released: 06/29/2009 Document Revised: 02/08/2016 Document Reviewed: 10/07/2014 Elsevier Interactive Patient Education  2017 [employer].

## 2023-05-05 NOTE — Progress Notes (Signed)
Subjective:   Jeanette Daniels is a 82 y.o. female who presents for Medicare Annual (Subsequent) preventive examination.  Visit Complete: Virtual  I connected with  [name] on 05/05/23 by a audio enabled telemedicine application and verified that I am speaking with the correct person using two identifiers.  Patient Location: Home  Provider Location: Home Office  I discussed the limitations of evaluation and management by telemedicine. The patient expressed understanding and agreed to proceed.  Patient Medicare AWV questionnaire was completed by the patient on 05/05/2023; I have confirmed that all information answered by patient is correct and no changes since this date.  Review of Systems    Vital Signs: Unable to obtain new vitals due to this being a telehealth visit.  Cardiac Risk Factors include: advanced age (>73men, >33 women);dyslipidemia;hypertension     Objective:    Today's Vitals   05/05/23 1033  Weight: 213 lb (96.6 kg)  Height: 5\' 7"  (1.702 m)   Body mass index is 33.36 kg/m.     05/05/2023   10:35 AM 05/01/2022   10:42 AM 04/30/2021    1:30 PM 04/26/2020   11:18 AM 03/02/2019    1:40 PM  Advanced Directives  Does Patient Have a Medical Advance Directive? Yes Yes Yes Yes No  Type of Estate agent of [place];Living will Healthcare Power of [place];Living will Healthcare Power of [place];Living will Healthcare Power of Attorney   Does patient want to make changes to medical advance directive?    No - Patient declined   Copy of Healthcare Power of Attorney in Chart? No - copy requested No - copy requested No - copy requested No - copy requested   Would patient like information on creating a medical advance directive?     No - Patient declined    Current Medications (verified) Outpatient Encounter Medications as of 05/05/2023  Medication Sig   acyclovir ointment (ZOVIRAX) 5 % Apply 1 application topically every 3 (three) hours.   Ascorbic  Acid (VITAMIN C) 100 MG tablet Take 100 mg by mouth daily.   aspirin EC 81 MG tablet Take 1 tablet (81 mg total) by mouth daily.   atorvastatin (LIPITOR) 20 MG tablet Take 1 tablet (20 mg total) by mouth daily.   Cholecalciferol (VITAMIN D) 2000 UNITS CAPS Take 1,000 Units by mouth 2 (two) times daily. gummie chewable   docusate sodium (COLACE) 100 MG capsule Take 100 mg by mouth daily.   glucose blood (ONETOUCH ULTRA) test strip Check BS up to 4 times daily Dx E11.9   losartan (COZAAR) 50 MG tablet Take 2 tablets (100 mg total) by mouth daily.   meclizine (ANTIVERT) 25 MG tablet Take 1 tablet (25 mg total) by mouth 3 (three) times daily as needed for dizziness.   metoprolol succinate (TOPROL-XL) 100 MG 24 hr tablet Take 1 tablet (100 mg total) by mouth 2 (two) times daily. Take with or immediately following a meal.   oxybutynin (DITROPAN-XL) 10 MG 24 hr tablet Take 1 tablet (10 mg total) by mouth at bedtime.   sertraline (ZOLOFT) 100 MG tablet Take 2 tablets (200 mg total) by mouth daily.   Vitamin D, Ergocalciferol, (DRISDOL) 1.25 MG (50000 UNIT) CAPS capsule Take 1 capsule (50,000 Units total) by mouth every 7 (seven) days.   zolpidem (AMBIEN) 10 MG tablet Take 1 tablet (10 mg total) by mouth at bedtime.   No facility-administered encounter medications on file as of 05/05/2023.    Allergies (verified) Hydrochlorothiazide,  Ace inhibitors, Augmentin [amoxicillin-pot clavulanate], and Celecoxib   History: Past Medical History:  Diagnosis Date   Breast cancer (HCC)    S/P chemotherapy, radiation, and surgery   Diabetes mellitus without complication (HCC)    Hyperlipidemia    x16 years   Hypertension    x16 years   Past Surgical History:  Procedure Laterality Date   MASTECTOMY     Left   TOTAL KNEE ARTHROPLASTY     Right   Family History  Problem Relation Age of Onset   Alzheimer's disease Father    Diabetes Father    Hypertension Sister    Diabetes Brother    Hypertension  Brother    Leukemia Son    Social History   Socioeconomic History   Marital status: Married    Spouse name: Windy Fast   Number of children: 2   Years of education: 12   Highest education level: High school graduate  Occupational History   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  Tobacco Use   Smoking status: Never   Smokeless tobacco: Never  Vaping Use   Vaping status: Never Used  Substance and Sexual Activity   Alcohol use: No   Drug use: No   Sexual activity: Yes    Birth control/protection: Post-menopausal  Other Topics Concern   Not on file  Social History Narrative   Married with 2 children   Social Determinants of Health   Financial Resource Strain: Low Risk  (05/05/2023)   Overall Financial Resource Strain (CARDIA)    Difficulty of Paying Living Expenses: Not hard at all  Food Insecurity: No Food Insecurity (05/05/2023)   Hunger Vital Sign    Worried About Running Out of Food in the Last Year: Never true    Ran Out of Food in the Last Year: Never true  Transportation Needs: No Transportation Needs (05/05/2023)   PRAPARE - Administrator, Civil Service (Medical): No    Lack of Transportation (Non-Medical): No  Physical Activity: Insufficiently Active (05/05/2023)   Exercise Vital Sign    Days of Exercise per Week: 3 days    Minutes of Exercise per Session: 30 min  Stress: No Stress Concern Present (05/05/2023)   [employer] of Occupational Health - Occupational Stress Questionnaire    Feeling of Stress : Not at all  Social Connections: Socially Integrated (05/05/2023)   Social Connection and Isolation Panel [NHANES]    Frequency of Communication with Friends and Family: More than three times a week    Frequency of Social Gatherings with Friends and Family: More than three times a week    Attends Religious Services: More than 4 times per year    Active Member of [employer] or Organizations: Yes    Attends Museum/gallery exhibitions officer: More than 4 times per year    Marital Status: Married    Tobacco Counseling Counseling given: Not Answered   Clinical Intake:  Pre-visit preparation completed: Yes  Pain : No/denies pain     Nutritional Risks: None Diabetes: No  How often do you need to have someone help you when you read instructions, pamphlets, or other written materials from your doctor or pharmacy?: 1 - Never  Interpreter Needed?: No  Information entered by :: Renie Ora, LPN   Activities of Daily Living    05/05/2023   10:35 AM  In your present state of health, do you have any difficulty performing the following activities:  Hearing? 0  Vision? 0  Difficulty concentrating or making decisions? 0  Walking or climbing stairs? 0  Dressing or bathing? 0  Doing errands, shopping? 0  Preparing Food and eating ? N  Using the Toilet? N  In the past six months, have you accidently leaked urine? N  Do you have problems with loss of bowel control? N  Managing your Medications? N  Managing your Finances? N  Housekeeping or managing your Housekeeping? N    Patient Care Team: Junie Spencer, FNP as PCP - General (Nurse Practitioner)  Indicate any recent Medical Services you may have received from other than [facility] providers in the past year (date may be approximate).     Assessment:   This is a routine wellness examination for Sofia.  Hearing/Vision screen Vision Screening - Comments:: Wears rx glasses - up to date with routine eye exams with  [facility]   Dietary issues and exercise activities discussed:     Goals Addressed             This Visit's Progress    Prevent falls   On track    05/01/2022 - Keep walking, maintain independence       Depression Screen    05/05/2023   10:34 AM 10/31/2022    9:42 AM 05/01/2022   10:40 AM 04/30/2022    9:30 AM 04/30/2021    1:29 PM 04/27/2021    1:57 PM 01/23/2021    4:04 PM  PHQ 2/9 Scores  PHQ - 2 Score 0 0 0  0 0 0 0  PHQ- 9 Score  0 0 0 0 0     Fall Risk    05/05/2023   10:33 AM 10/31/2022    9:42 AM 05/01/2022   10:36 AM 04/30/2022    9:30 AM 04/30/2021    1:31 PM  Fall Risk   Falls in the past year? 0 0 0 0 0  Number falls in past yr: 0  0  0  Injury with Fall? 0  0  0  Risk for fall due to : No Fall Risks  Orthopedic patient  Orthopedic patient;Impaired vision;Medication side effect  Follow up Falls prevention discussed Falls evaluation completed Falls prevention discussed  Falls prevention discussed    MEDICARE RISK AT HOME: Medicare Risk at Home Any stairs in or around the home?: No If so, are there any without handrails?: No Home free of loose throw rugs in walkways, pet beds, electrical cords, etc?: Yes Adequate lighting in your home to reduce risk of falls?: Yes Life alert?: No Use of a cane, walker or w/c?: No Grab bars in the bathroom?: Yes Shower chair or bench in shower?: Yes Elevated toilet seat or a handicapped toilet?: Yes  TIMED UP AND GO:  Was the test performed?  No    Cognitive Function:        05/05/2023   10:36 AM 05/01/2022   10:42 AM 04/26/2020   11:22 AM 03/02/2019    1:44 PM  6CIT Screen  What Year? 0 points 0 points 0 points 0 points  What month? 0 points 0 points 0 points 0 points  What time? 0 points 0 points 0 points 0 points  Count back from 20 0 points 0 points 0 points 0 points  Months in reverse 0 points 0 points 0 points 0 points  Repeat phrase 0 points 4 points 2 points 0 points  Total Score 0 points 4 points 2 points 0 points  Immunizations Immunization History  Administered Date(s) Administered   Fluad Quad(high Dose 65+) 06/07/2019, 06/23/2020, 06/25/2021, 07/11/2022   H1N1 10/07/2008   Hepatitis B 07/28/1998, 08/28/1998, 01/29/1999   Influenza Split 06/15/2008, 06/20/2011, 06/16/2018   Influenza Whole 07/23/2012   Influenza, High Dose Seasonal PF 07/11/2017, 06/10/2018   Influenza, Seasonal, Injecte, Preservative Fre  06/09/2009, 06/28/2010, 07/23/2012   Influenza,inj,Quad PF,6+ Mos 06/23/2013, 07/11/2014, 07/11/2015, 07/10/2016, 06/07/2019   Influenza-Unspecified 08/27/2000, 09/21/2003, 06/16/2018   Moderna SARS-COV2 Booster Vaccination 07/16/2022   Moderna Sars-Covid-2 Vaccination 10/09/2019, 11/07/2019, 07/12/2020   Pneumococcal Conjugate-13 05/24/2014   Pneumococcal Polysaccharide-23 06/16/2010, 06/28/2010   Tdap 02/14/2009, 03/04/2011, 04/27/2021   Zoster Recombinant(Shingrix) 10/29/2021, 04/30/2022   Zoster, Live 09/16/2005    TDAP status: Up to date  Flu Vaccine status: Up to date  Pneumococcal vaccine status: Up to date  Covid-19 vaccine status: Completed vaccines  Qualifies for Shingles Vaccine? Yes   Zostavax completed Yes   Shingrix Completed?: Yes  Screening Tests Health Maintenance  Topic Date Due   DEXA SCAN  05/11/2020   COVID-19 Vaccine (4 - 2023-24 season) 09/10/2022   OPHTHALMOLOGY EXAM  02/08/2023   INFLUENZA VACCINE  12/15/2023 (Originally 04/17/2023)   HEMOGLOBIN A1C  11/01/2023   Diabetic kidney evaluation - eGFR measurement  04/30/2024   Diabetic kidney evaluation - Urine ACR  04/30/2024   FOOT EXAM  04/30/2024   Medicare Annual Wellness (AWV)  05/04/2024   DTaP/Tdap/Td (4 - Td or Tdap) 04/28/2031   Pneumonia Vaccine 63+ Years old  Completed   Zoster Vaccines- Shingrix  Completed   HPV VACCINES  Aged Out   Hepatitis C Screening  Discontinued    Health Maintenance  Health Maintenance Due  Topic Date Due   DEXA SCAN  05/11/2020   COVID-19 Vaccine (4 - 2023-24 season) 09/10/2022   OPHTHALMOLOGY EXAM  02/08/2023    Colorectal cancer screening: No longer required.   Mammogram status: No longer required due to age.  Bone Density status: Ordered 05/05/2023. Pt provided with contact info and advised to call to schedule appt.  Lung Cancer Screening: (Low Dose CT Chest recommended if Age 51-80 years, 20 pack-year currently smoking OR have quit w/in 15years.)  does not qualify.   Lung Cancer Screening Referral: n/a  Additional Screening:  Hepatitis C Screening: does not qualify;   Vision Screening: Recommended annual ophthalmology exams for early detection of glaucoma and other disorders of the eye. Is the patient up to date with their annual eye exam?  Yes  Who is the provider or what is the name of the office in which the patient attends annual eye exams? [facility]  If pt is not established with a provider, would they like to be referred to a provider to establish care? No .   Dental Screening: Recommended annual dental exams for proper oral hygiene    Community Resource Referral / Chronic Care Management: CRR required this visit?  No   CCM required this visit?  No     Plan:     I have personally reviewed and noted the following in the patient's chart:   Medical and social history Use of alcohol, tobacco or illicit drugs  Current medications and supplements including opioid prescriptions. Patient is not currently taking opioid prescriptions. Functional ability and status Nutritional status Physical activity Advanced directives List of other physicians Hospitalizations, surgeries, and ER visits in previous 12 months Vitals Screenings to include cognitive, depression, and falls Referrals and appointments  In addition, I have reviewed and discussed  with patient certain preventive protocols, quality metrics, and best practice recommendations. A written personalized care plan for preventive services as well as general preventive health recommendations were provided to patient.     Lorrene Reid, LPN   [id number]   After Visit Summary: (MyChart) Due to this being a telephonic visit, the after visit summary with patients personalized plan was offered to patient via MyChart   Nurse Notes: none

## 2023-05-20 DIAGNOSIS — U071 COVID-19: Secondary | ICD-10-CM | POA: Diagnosis not present

## 2023-06-02 ENCOUNTER — Other Ambulatory Visit: Payer: Self-pay | Admitting: Family

## 2023-06-02 DIAGNOSIS — E1169 Type 2 diabetes mellitus with other specified complication: Secondary | ICD-10-CM

## 2023-06-03 DIAGNOSIS — Z1231 Encounter for screening mammogram for malignant neoplasm of breast: Secondary | ICD-10-CM | POA: Diagnosis not present

## 2023-06-03 DIAGNOSIS — R92323 Mammographic fibroglandular density, bilateral breasts: Secondary | ICD-10-CM | POA: Diagnosis not present

## 2023-06-03 LAB — HM MAMMOGRAPHY

## 2023-06-04 ENCOUNTER — Encounter: Payer: Self-pay | Admitting: Family

## 2023-07-21 ENCOUNTER — Other Ambulatory Visit: Payer: Self-pay | Admitting: Family

## 2023-09-15 ENCOUNTER — Other Ambulatory Visit: Payer: Self-pay | Admitting: Family

## 2023-09-15 DIAGNOSIS — E1159 Type 2 diabetes mellitus with other circulatory complications: Secondary | ICD-10-CM

## 2023-09-15 DIAGNOSIS — F411 Generalized anxiety disorder: Secondary | ICD-10-CM

## 2023-09-15 DIAGNOSIS — I152 Hypertension secondary to endocrine disorders: Secondary | ICD-10-CM

## 2023-11-03 ENCOUNTER — Ambulatory Visit: Payer: Medicare PPO | Admitting: Family

## 2023-11-03 ENCOUNTER — Other Ambulatory Visit: Payer: Medicare PPO

## 2023-11-07 ENCOUNTER — Other Ambulatory Visit: Payer: Self-pay | Admitting: Family

## 2023-11-07 DIAGNOSIS — G4700 Insomnia, unspecified: Secondary | ICD-10-CM

## 2023-11-10 ENCOUNTER — Ambulatory Visit: Payer: Medicare PPO | Admitting: Family

## 2023-11-10 ENCOUNTER — Ambulatory Visit (INDEPENDENT_AMBULATORY_CARE_PROVIDER_SITE_OTHER): Payer: Medicare PPO

## 2023-11-10 ENCOUNTER — Encounter: Payer: Self-pay | Admitting: Family

## 2023-11-10 VITALS — BP 121/74 | HR 79 | Temp 97.9°F | Ht 67.0 in | Wt 211.4 lb

## 2023-11-10 DIAGNOSIS — E1159 Type 2 diabetes mellitus with other circulatory complications: Secondary | ICD-10-CM | POA: Diagnosis not present

## 2023-11-10 DIAGNOSIS — E1169 Type 2 diabetes mellitus with other specified complication: Secondary | ICD-10-CM | POA: Diagnosis not present

## 2023-11-10 DIAGNOSIS — N3281 Overactive bladder: Secondary | ICD-10-CM

## 2023-11-10 DIAGNOSIS — G4700 Insomnia, unspecified: Secondary | ICD-10-CM | POA: Diagnosis not present

## 2023-11-10 DIAGNOSIS — F411 Generalized anxiety disorder: Secondary | ICD-10-CM

## 2023-11-10 DIAGNOSIS — I152 Hypertension secondary to endocrine disorders: Secondary | ICD-10-CM

## 2023-11-10 DIAGNOSIS — R42 Dizziness and giddiness: Secondary | ICD-10-CM | POA: Diagnosis not present

## 2023-11-10 DIAGNOSIS — Z0000 Encounter for general adult medical examination without abnormal findings: Secondary | ICD-10-CM

## 2023-11-10 DIAGNOSIS — Z0001 Encounter for general adult medical examination with abnormal findings: Secondary | ICD-10-CM

## 2023-11-10 DIAGNOSIS — K5900 Constipation, unspecified: Secondary | ICD-10-CM | POA: Diagnosis not present

## 2023-11-10 DIAGNOSIS — Z780 Asymptomatic menopausal state: Secondary | ICD-10-CM

## 2023-11-10 DIAGNOSIS — E669 Obesity, unspecified: Secondary | ICD-10-CM

## 2023-11-10 DIAGNOSIS — E785 Hyperlipidemia, unspecified: Secondary | ICD-10-CM

## 2023-11-10 DIAGNOSIS — M8580 Other specified disorders of bone density and structure, unspecified site: Secondary | ICD-10-CM

## 2023-11-10 DIAGNOSIS — Z79899 Other long term (current) drug therapy: Secondary | ICD-10-CM | POA: Diagnosis not present

## 2023-11-10 DIAGNOSIS — E559 Vitamin D deficiency, unspecified: Secondary | ICD-10-CM

## 2023-11-10 MED ORDER — ZOLPIDEM TARTRATE 10 MG PO TABS: 10.0000 mg | ORAL_TABLET | Freq: Every day | ORAL | 1 refills | Status: DC

## 2023-11-10 MED ORDER — LOSARTAN POTASSIUM 50 MG PO TABS: 100.0000 mg | ORAL_TABLET | Freq: Every day | ORAL | 0 refills | Status: DC

## 2023-11-10 MED ORDER — MECLIZINE HCL 25 MG PO TABS: 25.0000 mg | ORAL_TABLET | Freq: Three times a day (TID) | ORAL | 2 refills | Status: DC | PRN

## 2023-11-10 MED ORDER — ATORVASTATIN CALCIUM 20 MG PO TABS: 20.0000 mg | ORAL_TABLET | Freq: Every day | ORAL | 1 refills | Status: DC

## 2023-11-10 MED ORDER — OXYBUTYNIN CHLORIDE ER 10 MG PO TB24: 10.0000 mg | ORAL_TABLET | Freq: Every day | ORAL | 1 refills | Status: DC

## 2023-11-10 NOTE — Progress Notes (Signed)
 Subjective:    Patient ID: Jeanette Daniels, female    DOB: Nov 25, 1940, 83 y.o.   MRN: [id number]  Chief Complaint  Patient presents with   Medical Management of Chronic Issues    Pt has fallen twice in the past 3 months. The last fall was this past Saturday, her left shoulder is bruised and hurting.   Pt presents to the office today for CPE and chronic follow up. She is retired now and enjoying life.    She has hx of left breast cancer in 2006 and is followed by Oncologists as needed.    She has osteopenia, her last dexa scan was 05/11/18. Hypertension This is a chronic problem. The current episode started more than 1 year ago. The problem has been resolved since onset. The problem is controlled. Associated symptoms include anxiety. Pertinent negatives include no blurred vision, malaise/fatigue, peripheral edema or shortness of breath. Risk factors for coronary artery disease include dyslipidemia, obesity and sedentary lifestyle. The current treatment provides moderate improvement.  Constipation This is a chronic problem. The current episode started more than 1 year ago. The problem has been resolved since onset. Her stool frequency is 1 time per day. Risk factors include obesity. The treatment provided moderate relief.  Hyperlipidemia This is a chronic problem. The current episode started more than 1 year ago. Exacerbating diseases include obesity. Pertinent negatives include no shortness of breath. Current antihyperlipidemic treatment includes statins. The current treatment provides moderate improvement of lipids. Risk factors for coronary artery disease include dyslipidemia, hypertension, a sedentary lifestyle and post-menopausal.  Diabetes She presents for her follow-up diabetic visit. She has type 2 diabetes mellitus. Hypoglycemia symptoms include nervousness/anxiousness. Pertinent negatives for diabetes include no blurred vision and no foot paresthesias. Symptoms are stable. Risk factors  for coronary artery disease include dyslipidemia, diabetes mellitus, hypertension, sedentary lifestyle and post-menopausal. Her weight is stable. She is following a generally healthy diet. Her overall blood glucose range is 90-110 mg/dl.  Urinary Frequency  This is a chronic problem. The current episode started more than 1 year ago. The patient is experiencing no pain. Associated symptoms include frequency. Treatments tried: oxybutynin. The treatment provided mild relief.  Insomnia Primary symptoms: difficulty falling asleep, frequent awakening, no malaise/fatigue.   The current episode started more than one year. The onset quality is gradual. The problem occurs intermittently. Past treatments include medication. The treatment provided mild relief.  Anxiety Presents for follow-up visit. Symptoms include excessive worry, insomnia and nervous/anxious behavior. Patient reports no shortness of breath. Symptoms occur occasionally. The severity of symptoms is moderate.        Review of Systems  Constitutional:  Negative for malaise/fatigue.  Eyes:  Negative for blurred vision.  Respiratory:  Negative for shortness of breath.   Gastrointestinal:  Positive for constipation.  Genitourinary:  Positive for frequency.  Psychiatric/Behavioral:  The patient is nervous/anxious and has insomnia.   All other systems reviewed and are negative.   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  Tobacco Use   Smoking status: Never   Smokeless tobacco: Never  Vaping Use   Vaping  status: Never Used  Substance and Sexual Activity   Alcohol use: No   Drug use: No   Sexual activity: Yes    Birth control/protection: Post-menopausal  Other Topics Concern   Not on file  Social History Narrative   Married with 2 children   Social Drivers of Health   Financial Resource Strain: Low Risk  (05/05/2023)   Overall Financial Resource Strain (CARDIA)    Difficulty of Paying Living Expenses: Not hard at all  Food Insecurity: No Food Insecurity (05/05/2023)   Hunger Vital Sign    Worried About Running Out of Food in the Last Year: Never true    Ran Out of Food in the Last Year: Never true  Transportation Needs: No Transportation Needs (05/05/2023)   PRAPARE - Administrator, Civil Service (Medical): No    Lack of Transportation (Non-Medical): No  Physical Activity: Insufficiently Active (05/05/2023)   Exercise Vital Sign    Days of Exercise per Week: 3 days    Minutes of Exercise per Session: 30 min  Stress: No Stress Concern Present (05/05/2023)   [employer] of Occupational Health - Occupational Stress Questionnaire    Feeling of Stress : Not at all  Social Connections: Socially Integrated (05/05/2023)   Social Connection and Isolation Panel [NHANES]    Frequency of Communication with Friends and Family: More than three times a week    Frequency of Social Gatherings with Friends and Family: More than three times a week    Attends Religious Services: More than 4 times per year    Active Member of [employer] or Organizations: Yes    Attends Engineer, structural: More than 4 times per year    Marital Status: Married       Objective:   Physical Exam Vitals reviewed.  Constitutional:      General: She is not in acute distress.    Appearance: She is well-developed. She is obese.  HENT:     Head: Normocephalic and atraumatic.     Right Ear: Tympanic membrane normal.     Left Ear: Tympanic membrane normal.  Eyes:     Pupils: Pupils are equal,  round, and reactive to light.  Neck:     Thyroid: No thyromegaly.  Cardiovascular:     Rate and Rhythm: Normal rate and regular rhythm.     Heart sounds: Normal heart sounds. No murmur heard. Pulmonary:     Effort: Pulmonary effort is normal. No respiratory distress.     Breath sounds: Normal breath sounds. No wheezing.  Abdominal:     General: Bowel sounds are normal. There is no distension.     Palpations: Abdomen is soft.     Tenderness: There is no abdominal tenderness.  Musculoskeletal:        General: No tenderness. Normal range of motion.     Cervical back: Normal range of motion and neck supple.  Skin:    General: Skin is warm and dry.  Neurological:     Mental Status: She is alert and oriented to person, place, and time.     Cranial Nerves: No cranial nerve deficit.     Motor: Weakness: walking with cane.     Deep Tendon Reflexes: Reflexes are normal and symmetric.  Psychiatric:        Behavior: Behavior normal.  Thought Content: Thought content normal.        Judgment: Judgment normal.      BP 121/74   Pulse 79   Temp 97.9 F (36.6 C)   Ht 5\' 7"  (1.702 m)   Wt 211 lb 6.4 oz (95.9 kg)   SpO2 97%   BMI 33.11 kg/m      Assessment & Plan:  CRYSTALEE VENTRESS comes in today with chief complaint of Medical Management of Chronic Issues (Pt has fallen twice in the past 3 months. The last fall was this past Saturday, her left shoulder is bruised and hurting.)   Diagnosis and orders addressed:  1. Insomnia, unspecified type - zolpidem (AMBIEN) 10 MG tablet; Take 1 tablet (10 mg total) by mouth at bedtime.  Dispense: 90 tablet; Refill: 1 - CMP14+EGFR - CBC with Differential/Platelet  2. Overactive bladder - oxybutynin (DITROPAN-XL) 10 MG 24 hr tablet; Take 1 tablet (10 mg total) by mouth at bedtime.  Dispense: 90 tablet; Refill: 1 - CMP14+EGFR - CBC with Differential/Platelet  3. Dizziness  - meclizine (ANTIVERT) 25 MG tablet; Take 1 tablet (25 mg total) by  mouth 3 (three) times daily as needed for dizziness.  Dispense: 30 tablet; Refill: 2 - CMP14+EGFR - CBC with Differential/Platelet  4. Hypertension associated with diabetes (HCC) - losartan (COZAAR) 50 MG tablet; Take 2 tablets (100 mg total) by mouth daily.  Dispense: 180 tablet; Refill: 0 - CMP14+EGFR - CBC with Differential/Platelet  5. Hyperlipidemia associated with type 2 diabetes mellitus (HCC) - atorvastatin (LIPITOR) 20 MG tablet; Take 1 tablet (20 mg total) by mouth daily.  Dispense: 90 tablet; Refill: 1 - CMP14+EGFR - CBC with Differential/Platelet  6. Annual physical exam (Primary) - ToxASSURE Select 13 (MW), Urine - CMP14+EGFR - CBC with Differential/Platelet - Lipid panel - VITAMIN D 25 Hydroxy (Vit-D Deficiency, Fractures) - Bayer DCA Hb A1c Waived  7. Type 2 diabetes mellitus with other specified complication, without long-term current use of insulin (HCC) - CMP14+EGFR - CBC with Differential/Platelet - Bayer DCA Hb A1c Waived  8. Controlled substance agreement signed - ToxASSURE Select 13 (MW), Urine - CMP14+EGFR - CBC with Differential/Platelet  9. Constipation, unspecified constipation type - CMP14+EGFR - CBC with Differential/Platelet  10. GAD (generalized anxiety disorder) - CMP14+EGFR - CBC with Differential/Platelet  11. Obesity (BMI 30-39.9) - CMP14+EGFR - CBC with Differential/Platelet  12. Osteopenia, unspecified location - CMP14+EGFR - CBC with Differential/Platelet  13. Vitamin D deficiency - CMP14+EGFR - CBC with Differential/Platelet - VITAMIN D 25 Hydroxy (Vit-D Deficiency, Fractures)   Labs pending Patient reviewed in [place] controlled database, no flags noted. Contract and drug screen are up to date.  Continue medications  Health Maintenance reviewed Diet and exercise encouraged  Follow up plan: 6 months    Jannifer Rodney, FNP

## 2023-11-10 NOTE — Patient Instructions (Signed)
 Fall Prevention in Hospitals, Adult Staying in the hospital puts you at risk of falling. Falls can cause serious injuries, but they can be prevented. Make sure you know what puts you at risk for falling and what you and your health care team can do to prevent falls. If you or a loved one falls in the hospital, tell the hospital staff about it. What can increase my risk of falls? Factors that increase your risk of falling in the hospital include: Being in an unfamiliar environment, especially when using the bathroom at night. Having surgery or being on bed rest. Taking many medicines or certain types of medicines, such as sleeping pills. Some medicines can cause confusion, trouble with balance, dizziness, or low blood pressure. Having tubes in place, such as IVs or catheters. Other risk factors for falls while in the hospital include: Having trouble with hearing or vision. Having depression. Needing to use the toilet frequently. Having fallen during the past 3 months. What actions can I take to prevent falls? If you or a loved one has to stay in the hospital: Ask about which fall prevention strategies will be in place. Do not get up by yourself if you have been asked to call for help when getting up. Asking for help to get up is for your safety, and the staff is there to help you. Wear non-skid shoes or non-skid slippers. Get up slowly, and sit at the side of the bed for a few minutes before standing up. Keep items you need close to you, such as the call button or a phone, so that you do not need to reach for them. Wear eyeglasses or hearing aids as told by your health care provider. Have someone stay in the hospital with you or your loved one. Ask if sleeping pills or other medicines that can cause confusion or dizziness are necessary if they are prescribed to you or a loved one. What does the hospital staff do to help prevent falls?     Hospitals have systems in place to prevent falls  and accidents, which may include: Discussing your fall risk and making a personalized fall prevention plan. Checking in regularly to see if you need help. Some hospitals use video monitoring that allows a staff member to come to you if you need help. Placing an armband on your wrist or a sign near your room to alert other staff of your needs. Using an alarm on your hospital bed. This is an alarm that goes off if you get out of bed and forget to call for help. Keeping the bed in a low and locked position. Keeping the area around the bed and bathroom well-lit and not cluttered. Having a staff person stay with you (one-on-one observation), even when you are using the bathroom. This is for your safety. Using safety equipment, such as: A belt around your waist. Walkers, crutches, and other devices for support. Safety beds, such as low beds, or cushions on the floor next to the bed. What other actions can I take to prevent falls? Check in regularly with your provider or pharmacist to review all medicines that you take. Make sure that you have a regular exercise program to stay physically fit. This will help you maintain your balance. Talk with a physical therapist if recommended by your provider. A physical therapist can help you learn to do exercises to improve movement and strength. If you are over 82 years old: Ask your provider if you need a calcium  or vitamin D supplement. Have your eyes and hearing checked every year. Have your feet checked every year. This information is not intended to replace advice given to you by your health care provider. Make sure you discuss any questions you have with your health care provider. Document Revised: 05/06/2022 Document Reviewed: 05/06/2022 Elsevier Patient Education  2024 [employer].

## 2023-11-13 ENCOUNTER — Other Ambulatory Visit: Payer: Self-pay | Admitting: Family

## 2023-11-13 DIAGNOSIS — M85852 Other specified disorders of bone density and structure, left thigh: Secondary | ICD-10-CM | POA: Diagnosis not present

## 2023-11-13 DIAGNOSIS — Z780 Asymptomatic menopausal state: Secondary | ICD-10-CM | POA: Diagnosis not present

## 2023-11-13 DIAGNOSIS — M85851 Other specified disorders of bone density and structure, right thigh: Secondary | ICD-10-CM | POA: Diagnosis not present

## 2023-11-20 DIAGNOSIS — M5459 Other low back pain: Secondary | ICD-10-CM | POA: Diagnosis not present

## 2023-12-15 ENCOUNTER — Other Ambulatory Visit: Payer: Self-pay | Admitting: Family

## 2023-12-15 DIAGNOSIS — E1159 Type 2 diabetes mellitus with other circulatory complications: Secondary | ICD-10-CM

## 2023-12-15 DIAGNOSIS — F411 Generalized anxiety disorder: Secondary | ICD-10-CM

## 2023-12-18 DIAGNOSIS — S3210XA Unspecified fracture of sacrum, initial encounter for closed fracture: Secondary | ICD-10-CM | POA: Diagnosis not present

## 2023-12-18 DIAGNOSIS — M5459 Other low back pain: Secondary | ICD-10-CM | POA: Diagnosis not present

## 2024-02-19 DIAGNOSIS — M4848XA Fatigue fracture of vertebra, sacral and sacrococcygeal region, initial encounter for fracture: Secondary | ICD-10-CM | POA: Diagnosis not present

## 2024-02-19 DIAGNOSIS — M5459 Other low back pain: Secondary | ICD-10-CM | POA: Diagnosis not present

## 2024-03-11 ENCOUNTER — Other Ambulatory Visit: Payer: Self-pay | Admitting: Family

## 2024-03-11 DIAGNOSIS — E1169 Type 2 diabetes mellitus with other specified complication: Secondary | ICD-10-CM

## 2024-03-11 DIAGNOSIS — M25561 Pain in right knee: Secondary | ICD-10-CM | POA: Diagnosis not present

## 2024-03-11 DIAGNOSIS — F411 Generalized anxiety disorder: Secondary | ICD-10-CM

## 2024-03-11 NOTE — Telephone Encounter (Signed)
 Appt 05-04-2024 with Jeanette Daniels

## 2024-03-11 NOTE — Telephone Encounter (Signed)
 Christy NTBS in Aug for 6 mos FU RF sent to pharmacy

## 2024-04-08 ENCOUNTER — Other Ambulatory Visit: Payer: Self-pay | Admitting: Family

## 2024-04-08 DIAGNOSIS — Z20822 Contact with and (suspected) exposure to covid-19: Secondary | ICD-10-CM | POA: Diagnosis not present

## 2024-04-08 DIAGNOSIS — E1159 Type 2 diabetes mellitus with other circulatory complications: Secondary | ICD-10-CM

## 2024-04-08 DIAGNOSIS — R070 Pain in throat: Secondary | ICD-10-CM | POA: Diagnosis not present

## 2024-04-08 DIAGNOSIS — J00 Acute nasopharyngitis [common cold]: Secondary | ICD-10-CM | POA: Diagnosis not present

## 2024-04-29 DIAGNOSIS — M4848XA Fatigue fracture of vertebra, sacral and sacrococcygeal region, initial encounter for fracture: Secondary | ICD-10-CM | POA: Diagnosis not present

## 2024-05-04 ENCOUNTER — Ambulatory Visit: Admitting: Family

## 2024-05-04 ENCOUNTER — Encounter: Payer: Self-pay | Admitting: Family

## 2024-05-04 VITALS — BP 149/78 | HR 56 | Temp 97.6°F | Ht 67.0 in | Wt 211.6 lb

## 2024-05-04 DIAGNOSIS — K5900 Constipation, unspecified: Secondary | ICD-10-CM | POA: Diagnosis not present

## 2024-05-04 DIAGNOSIS — E669 Obesity, unspecified: Secondary | ICD-10-CM

## 2024-05-04 DIAGNOSIS — E785 Hyperlipidemia, unspecified: Secondary | ICD-10-CM

## 2024-05-04 DIAGNOSIS — E1159 Type 2 diabetes mellitus with other circulatory complications: Secondary | ICD-10-CM

## 2024-05-04 DIAGNOSIS — E559 Vitamin D deficiency, unspecified: Secondary | ICD-10-CM

## 2024-05-04 DIAGNOSIS — Z79899 Other long term (current) drug therapy: Secondary | ICD-10-CM

## 2024-05-04 DIAGNOSIS — M8580 Other specified disorders of bone density and structure, unspecified site: Secondary | ICD-10-CM

## 2024-05-04 DIAGNOSIS — R42 Dizziness and giddiness: Secondary | ICD-10-CM

## 2024-05-04 DIAGNOSIS — E1169 Type 2 diabetes mellitus with other specified complication: Secondary | ICD-10-CM

## 2024-05-04 DIAGNOSIS — F411 Generalized anxiety disorder: Secondary | ICD-10-CM

## 2024-05-04 DIAGNOSIS — G4700 Insomnia, unspecified: Secondary | ICD-10-CM

## 2024-05-04 DIAGNOSIS — N3281 Overactive bladder: Secondary | ICD-10-CM

## 2024-05-04 DIAGNOSIS — I152 Hypertension secondary to endocrine disorders: Secondary | ICD-10-CM

## 2024-05-04 LAB — BAYER DCA HB A1C WAIVED: HB A1C (BAYER DCA - WAIVED): 5.8 % — ABNORMAL HIGH (ref 4.8–5.6)

## 2024-05-04 MED ORDER — SERTRALINE HCL 100 MG PO TABS: 200.0000 mg | ORAL_TABLET | Freq: Every day | ORAL | 0 refills | Status: DC

## 2024-05-04 MED ORDER — LOSARTAN POTASSIUM 50 MG PO TABS: 100.0000 mg | ORAL_TABLET | Freq: Every day | ORAL | 0 refills | Status: DC

## 2024-05-04 MED ORDER — ATORVASTATIN CALCIUM 20 MG PO TABS: 20.0000 mg | ORAL_TABLET | Freq: Every day | ORAL | 0 refills | Status: AC

## 2024-05-04 MED ORDER — MECLIZINE HCL 25 MG PO TABS: 25.0000 mg | ORAL_TABLET | Freq: Three times a day (TID) | ORAL | 2 refills | Status: AC | PRN

## 2024-05-04 MED ORDER — ZOLPIDEM TARTRATE 10 MG PO TABS: 10.0000 mg | ORAL_TABLET | Freq: Every day | ORAL | 1 refills | Status: DC

## 2024-05-04 NOTE — Progress Notes (Signed)
 Subjective:    Patient ID: Jeanette Daniels, female    DOB: 08/16/1941, 83 y.o.   MRN: [id number]  Chief Complaint  Patient presents with   Medical Management of Chronic Issues    FRACTURE TAILBONE BACK IN FEBRUARY    Pt presents to the office today for chronic follow up. She is retired now and enjoying life.    She has hx of left breast cancer in 2006 and is followed by Oncologists as needed.    She has osteopenia, her last dexa scan was 05/11/18.  She fell 04/29/24 and had a fracture of sacrum.  Hypertension This is a chronic problem. The current episode started more than 1 year ago. The problem has been waxing and waning since onset. The problem is uncontrolled. Associated symptoms include anxiety and peripheral edema (a little). Pertinent negatives include no blurred vision, malaise/fatigue or shortness of breath. Risk factors for coronary artery disease include dyslipidemia, obesity and sedentary lifestyle. The current treatment provides moderate improvement.  Constipation This is a chronic problem. The current episode started more than 1 year ago. The problem has been resolved since onset. Her stool frequency is 1 time per day. Risk factors include obesity. The treatment provided moderate relief.  Hyperlipidemia This is a chronic problem. The current episode started more than 1 year ago. The problem is uncontrolled. Recent lipid tests were reviewed and are high. Exacerbating diseases include obesity. Pertinent negatives include no shortness of breath. Current antihyperlipidemic treatment includes statins. The current treatment provides moderate improvement of lipids. Risk factors for coronary artery disease include dyslipidemia, hypertension, a sedentary lifestyle and post-menopausal.  Diabetes She presents for her follow-up diabetic visit. She has type 2 diabetes mellitus. Hypoglycemia symptoms include nervousness/anxiousness. Pertinent negatives for diabetes include no blurred vision  and no foot paresthesias. Symptoms are stable. Risk factors for coronary artery disease include dyslipidemia, diabetes mellitus, hypertension, sedentary lifestyle and post-menopausal. Her weight is stable. She is following a generally healthy diet. Her overall blood glucose range is 90-110 mg/dl.  Urinary Frequency  This is a chronic problem. The current episode started more than 1 year ago. The patient is experiencing no pain. Associated symptoms include frequency. Treatments tried: oxybutynin . The treatment provided mild relief.  Insomnia Primary symptoms: difficulty falling asleep, frequent awakening, no malaise/fatigue.   The current episode started more than one year. The onset quality is gradual. The problem occurs intermittently. Past treatments include medication. The treatment provided mild relief.  Anxiety Presents for follow-up visit. Symptoms include excessive worry, insomnia and nervous/anxious behavior. Patient reports no shortness of breath. Symptoms occur occasionally. The severity of symptoms is moderate.        Review of Systems  Constitutional:  Negative for malaise/fatigue.  Eyes:  Negative for blurred vision.  Respiratory:  Negative for shortness of breath.   Gastrointestinal:  Positive for constipation.  Genitourinary:  Positive for frequency.  Psychiatric/Behavioral:  The patient is nervous/anxious and has insomnia.   All other systems reviewed and are negative.   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  Tobacco Use   Smoking status:  Never   Smokeless tobacco: Never  Vaping Use   Vaping status: Never Used  Substance and Sexual Activity   Alcohol use: No   Drug use: No   Sexual activity: Yes    Birth control/protection: Post-menopausal  Other Topics Concern   Not on file  Social History Narrative   Married with 2 children   Social Drivers of Health   Financial Resource Strain: Low Risk  (05/05/2023)   Overall Financial Resource Strain (CARDIA)    Difficulty of Paying Living Expenses: Not hard at all  Food Insecurity: No Food Insecurity (04/08/2024)   Received from [facility]   Hunger Vital Sign    Within the past 12 months, you worried that your food would run out before you got the money to buy more.: Never true    Within the past 12 months, the food you bought just didn't last and you didn't have money to get more.: Never true  Transportation Needs: No Transportation Needs (04/08/2024)   Received from [facility]   PRAPARE - Transportation    Lack of Transportation (Medical): No    Lack of Transportation (Non-Medical): No  Physical Activity: Insufficiently Active (05/05/2023)   Exercise Vital Sign    Days of Exercise per Week: 3 days    Minutes of Exercise per Session: 30 min  Stress: No Stress Concern Present (05/05/2023)   [employer] of Occupational Health - Occupational Stress Questionnaire    Feeling of Stress : Not at all  Social Connections: Socially Integrated (05/05/2023)   Social Connection and Isolation Panel    Frequency of Communication with Friends and Family: More than three times a week    Frequency of Social Gatherings with Friends and Family: More than three times a week    Attends Religious Services: More than 4 times per year    Active Member of [employer] or Organizations: Yes    Attends Engineer, structural: More than 4 times per year    Marital Status: Married       Objective:   Physical Exam Vitals reviewed.  Constitutional:      General: She is not in acute  distress.    Appearance: She is well-developed. She is obese.  HENT:     Head: Normocephalic and atraumatic.     Right Ear: Tympanic membrane normal.     Left Ear: Tympanic membrane normal.  Eyes:     Pupils: Pupils are equal, round, and reactive to light.  Neck:     Thyroid: No thyromegaly.  Cardiovascular:     Rate and Rhythm: Normal rate and regular rhythm.     Heart sounds: Normal heart sounds. No murmur heard. Pulmonary:     Effort: Pulmonary effort is normal. No respiratory distress.     Breath sounds: Normal breath sounds. No wheezing.  Abdominal:     General: Bowel sounds are normal. There is no distension.     Palpations: Abdomen is soft.     Tenderness: There is no abdominal tenderness.  Musculoskeletal:        General: No tenderness. Normal range of motion.     Cervical back: Normal range of motion and neck supple.     Right lower leg: Edema (trace) present.     Left lower leg: Edema (trace) present.  Skin:    General: Skin is warm and dry.  Neurological:  Mental Status: She is alert and oriented to person, place, and time.     Cranial Nerves: No cranial nerve deficit.     Motor: Weakness: walking with cane.     Deep Tendon Reflexes: Reflexes are normal and symmetric.  Psychiatric:        Behavior: Behavior normal.        Thought Content: Thought content normal.        Judgment: Judgment normal.     Diabetic Foot Exam - Simple   Simple Foot Form Diabetic Foot exam was performed with the following findings: Yes 05/04/2024  2:02 PM  Visual Inspection No deformities, no ulcerations, no other skin breakdown bilaterally: Yes Sensation Testing Intact to touch and monofilament testing bilaterally: Yes Pulse Check Posterior Tibialis and Dorsalis pulse intact bilaterally: Yes Comments     BP (!) 149/78   Pulse (!) 56   Temp 97.6 F (36.4 C) (Temporal)   Ht 5' 7 (1.702 m)   Wt 211 lb 9.6 oz (96 kg)   SpO2 97%   BMI 33.14 kg/m      Assessment & Plan:   JESUSITA JOCELYN comes in today with chief complaint of Medical Management of Chronic Issues (FRACTURE TAILBONE BACK IN [place] )   Diagnosis and orders addressed:  1. Hyperlipidemia associated with type 2 diabetes mellitus (HCC) - atorvastatin  (LIPITOR) 20 MG tablet; Take 1 tablet (20 mg total) by mouth daily.  Dispense: 90 tablet; Refill: 0 - CMP14+EGFR  2. Hypertension associated with diabetes (HCC) - losartan  (COZAAR ) 50 MG tablet; Take 2 tablets (100 mg total) by mouth daily.  Dispense: 180 tablet; Refill: 0 - CMP14+EGFR  3. Dizziness  - meclizine  (ANTIVERT ) 25 MG tablet; Take 1 tablet (25 mg total) by mouth 3 (three) times daily as needed for dizziness.  Dispense: 90 tablet; Refill: 2 - CMP14+EGFR  4. GAD (generalized anxiety disorder) - sertraline  (ZOLOFT ) 100 MG tablet; Take 2 tablets (200 mg total) by mouth daily.  Dispense: 180 tablet; Refill: 0 - CMP14+EGFR  5. Insomnia, unspecified type - zolpidem  (AMBIEN ) 10 MG tablet; Take 1 tablet (10 mg total) by mouth at bedtime.  Dispense: 90 tablet; Refill: 1 - CMP14+EGFR  6. Type 2 diabetes mellitus with other specified complication, without long-term current use of insulin (HCC) (Primary) - CMP14+EGFR - Microalbumin / creatinine urine ratio - Bayer DCA Hb A1c Waived  7. Vitamin D  deficiency - CMP14+EGFR  8. Constipation, unspecified constipation type - CMP14+EGFR  9. Obesity (BMI 30-39.9) - CMP14+EGFR  10. Controlled substance agreement signed - CMP14+EGFR  11. Osteopenia, unspecified location  - CMP14+EGFR  12. Overactive bladder - CMP14+EGFR   Labs pending Patient reviewed in [place] controlled database, no flags noted. Contract and drug screen are up to date.  Continue medications  Health Maintenance reviewed Diet and exercise encouraged  Follow up plan: 6 months    Bari Learn, FNP

## 2024-05-04 NOTE — Patient Instructions (Signed)
 Health Maintenance After Age 82 After age 27, you are at a higher risk for certain long-term diseases and infections as well as injuries from falls. Falls are a major cause of broken bones and head injuries in people who are older than age 73. Getting regular preventive care can help to keep you healthy and well. Preventive care includes getting regular testing and making lifestyle changes as recommended by your health care provider. Talk with your health care provider about: Which screenings and tests you should have. A screening is a test that checks for a disease when you have no symptoms. A diet and exercise plan that is right for you. What should I know about screenings and tests to prevent falls? Screening and testing are the best ways to find a health problem early. Early diagnosis and treatment give you the best chance of managing medical conditions that are common after age 90. Certain conditions and lifestyle choices may make you more likely to have a fall. Your health care provider may recommend: Regular vision checks. Poor vision and conditions such as cataracts can make you more likely to have a fall. If you wear glasses, make sure to get your prescription updated if your vision changes. Medicine review. Work with your health care provider to regularly review all of the medicines you are taking, including over-the-counter medicines. Ask your health care provider about any side effects that may make you more likely to have a fall. Tell your health care provider if any medicines that you take make you feel dizzy or sleepy. Strength and balance checks. Your health care provider may recommend certain tests to check your strength and balance while standing, walking, or changing positions. Foot health exam. Foot pain and numbness, as well as not wearing proper footwear, can make you more likely to have a fall. Screenings, including: Osteoporosis screening. Osteoporosis is a condition that causes  the bones to get weaker and break more easily. Blood pressure screening. Blood pressure changes and medicines to control blood pressure can make you feel dizzy. Depression screening. You may be more likely to have a fall if you have a fear of falling, feel depressed, or feel unable to do activities that you used to do. Alcohol  use screening. Using too much alcohol  can affect your balance and may make you more likely to have a fall. Follow these instructions at home: Lifestyle Do not drink alcohol  if: Your health care provider tells you not to drink. If you drink alcohol : Limit how much you have to: 0-1 drink a day for women. 0-2 drinks a day for men. Know how much alcohol  is in your drink. In the [place] one drink equals one 12 oz bottle of beer (355 mL), one 5 oz glass of wine (148 mL), or one 1 oz glass of hard liquor (44 mL). Do not use any products that contain nicotine or tobacco. These products include cigarettes, chewing tobacco, and vaping devices, such as e-cigarettes. If you need help quitting, ask your health care provider. Activity  Follow a regular exercise program to stay fit. This will help you maintain your balance. Ask your health care provider what types of exercise are appropriate for you. If you need a cane or walker, use it as recommended by your health care provider. Wear supportive shoes that have nonskid soles. Safety  Remove any tripping hazards, such as rugs, cords, and clutter. Install safety equipment such as grab bars in bathrooms and safety rails on stairs. Keep rooms and walkways  well-lit. General instructions Talk with your health care provider about your risks for falling. Tell your health care provider if: You fall. Be sure to tell your health care provider about all falls, even ones that seem minor. You feel dizzy, tiredness (fatigue), or off-balance. Take over-the-counter and prescription medicines only as told by your health care provider. These include  supplements. Eat a healthy diet and maintain a healthy weight. A healthy diet includes low-fat dairy products, low-fat (lean) meats, and fiber from whole grains, beans, and lots of fruits and vegetables. Stay current with your vaccines. Schedule regular health, dental, and eye exams. Summary Having a healthy lifestyle and getting preventive care can help to protect your health and wellness after age 15. Screening and testing are the best way to find a health problem early and help you avoid having a fall. Early diagnosis and treatment give you the best chance for managing medical conditions that are more common for people who are older than age 42. Falls are a major cause of broken bones and head injuries in people who are older than age 64. Take precautions to prevent a fall at home. Work with your health care provider to learn what changes you can make to improve your health and wellness and to prevent falls. This information is not intended to replace advice given to you by your health care provider. Make sure you discuss any questions you have with your health care provider. Document Revised: 01/22/2021 Document Reviewed: 01/22/2021 Elsevier Patient Education  2024 [employer].

## 2024-05-05 LAB — CMP14+EGFR
ALT: 12 IU/L (ref 0–32)
AST: 18 IU/L (ref 0–40)
Albumin: 4.3 g/dL (ref 3.7–4.7)
Alkaline Phosphatase: 88 IU/L (ref 44–121)
BUN/Creatinine Ratio: 19 (ref 12–28)
BUN: 15 mg/dL (ref 8–27)
Bilirubin Total: 0.3 mg/dL (ref 0.0–1.2)
CO2: 25 mmol/L (ref 20–29)
Calcium: 10 mg/dL (ref 8.7–10.3)
Chloride: 100 mmol/L (ref 96–106)
Creatinine, Ser: 0.78 mg/dL (ref 0.57–1.00)
Globulin, Total: 2.6 g/dL (ref 1.5–4.5)
Glucose: 102 mg/dL — ABNORMAL HIGH (ref 70–99)
Potassium: 4.6 mmol/L (ref 3.5–5.2)
Sodium: 138 mmol/L (ref 134–144)
Total Protein: 6.9 g/dL (ref 6.0–8.5)
eGFR: 76 mL/min/1.73 (ref >59)

## 2024-05-06 ENCOUNTER — Ambulatory Visit: Payer: Self-pay | Admitting: Family

## 2024-05-06 LAB — MICROALBUMIN / CREATININE URINE RATIO
Creatinine, Urine: 87.9 mg/dL
Microalb/Creat Ratio: 14 mg/g{creat} (ref 0–29)
Microalbumin, Urine: 12.5 ug/mL

## 2024-05-09 ENCOUNTER — Other Ambulatory Visit: Payer: Self-pay | Admitting: Family

## 2024-05-09 DIAGNOSIS — G4700 Insomnia, unspecified: Secondary | ICD-10-CM

## 2024-05-10 ENCOUNTER — Other Ambulatory Visit: Payer: Self-pay | Admitting: Family

## 2024-05-14 ENCOUNTER — Other Ambulatory Visit: Payer: Self-pay | Admitting: Family

## 2024-05-14 DIAGNOSIS — G4700 Insomnia, unspecified: Secondary | ICD-10-CM

## 2024-05-18 MED ORDER — ZOLPIDEM TARTRATE 10 MG PO TABS: 10.0000 mg | ORAL_TABLET | Freq: Every day | ORAL | 1 refills | Status: AC

## 2024-05-18 MED ORDER — ONDANSETRON HCL 4 MG PO TABS: 4.0000 mg | ORAL_TABLET | Freq: Three times a day (TID) | ORAL | 0 refills | Status: AC | PRN

## 2024-05-18 NOTE — Addendum Note (Signed)
 Addended by: LAVELL LYE A on: 05/18/2024 03:11 PM   Modules accepted: Orders

## 2024-05-18 NOTE — Telephone Encounter (Unsigned)
 Copied from CRM [id number]. Topic: Clinical - Prescription Issue >> May 18, 2024  2:22 PM Emylou G wrote: Reason for CRM: Please call patient.Jeanette Daniels looking fill  ondansetron  (ZOFRAN ) 4 MG tablet .Jeanette Daniels She knows its been awhile.Jeanette Daniels Pls review

## 2024-06-14 ENCOUNTER — Ambulatory Visit (INDEPENDENT_AMBULATORY_CARE_PROVIDER_SITE_OTHER)

## 2024-06-14 DIAGNOSIS — Z23 Encounter for immunization: Secondary | ICD-10-CM | POA: Diagnosis not present

## 2024-06-18 ENCOUNTER — Other Ambulatory Visit: Payer: Self-pay | Admitting: Family

## 2024-06-18 DIAGNOSIS — I152 Hypertension secondary to endocrine disorders: Secondary | ICD-10-CM

## 2024-07-08 ENCOUNTER — Ambulatory Visit

## 2024-07-09 ENCOUNTER — Other Ambulatory Visit: Payer: Self-pay | Admitting: Family

## 2024-07-09 DIAGNOSIS — N3281 Overactive bladder: Secondary | ICD-10-CM

## 2024-09-08 ENCOUNTER — Other Ambulatory Visit: Payer: Self-pay | Admitting: Family

## 2024-09-08 DIAGNOSIS — F411 Generalized anxiety disorder: Secondary | ICD-10-CM

## 2024-09-21 ENCOUNTER — Ambulatory Visit (INDEPENDENT_AMBULATORY_CARE_PROVIDER_SITE_OTHER): Payer: Self-pay

## 2024-09-21 VITALS — BP 149/78 | HR 56 | Ht 67.0 in | Wt 211.0 lb

## 2024-09-21 DIAGNOSIS — Z0000 Encounter for general adult medical examination without abnormal findings: Secondary | ICD-10-CM

## 2024-09-21 NOTE — Progress Notes (Signed)
 "  Chief Complaint  Patient presents with   Medicare Wellness     Subjective:   Jeanette Daniels is a 84 y.o. female who presents for a Medicare Annual Wellness Visit.  Visit info / Clinical Intake: Medicare Wellness Visit Type:: Subsequent Annual Wellness Visit Persons participating in visit and providing information:: patient Medicare Wellness Visit Mode:: Telephone If telephone:: video declined Since this visit was completed virtually, some vitals may be partially provided or unavailable. Missing vitals are due to the limitations of the virtual format.: Unable to obtain vitals - no equipment If Telephone or Video please confirm:: I connected with patient using audio/video enable telemedicine. I verified patient identity with two identifiers, discussed telehealth limitations, and patient agreed to proceed. Patient Location:: home Provider Location:: office Interpreter Needed?: No Pre-visit prep was completed: yes AWV questionnaire completed by patient prior to visit?: no Living arrangements:: lives with spouse/significant other Patient's Overall Health Status Rating: very good Typical amount of pain: none Does pain affect daily life?: no Are you currently prescribed opioids?: no  Dietary Habits and Nutritional Risks How many meals a day?: 3 Eats fruit and vegetables daily?: (!) no Most meals are obtained by: preparing own meals In the last 2 weeks, have you had any of the following?: none Diabetic:: no (per pt is not Diabetic however pt has DX of diabetes)  Functional Status Activities of Daily Living (to include ambulation/medication): Independent Ambulation: Independent with device- listed below Home Assistive Devices/Equipment: Cane (cane use PRN) Medication Administration: Independent Home Management (perform basic housework or laundry): Independent Manage your own finances?: yes Primary transportation is: driving Concerns about vision?: no *vision screening is required  for WTM* Concerns about hearing?: no  Fall Screening Falls in the past year?: 1 Number of falls in past year: 1 Was there an injury with Fall?: 1 Fall Risk Category Calculator: 3 Patient Fall Risk Level: High Fall Risk  Fall Risk Patient at Risk for Falls Due to: Impaired balance/gait; Impaired mobility Fall risk Follow up: Falls evaluation completed; Education provided  Home and Transportation Safety: All rugs have non-skid backing?: yes All stairs or steps have railings?: yes Grab bars in the bathtub or shower?: yes Have non-skid surface in bathtub or shower?: yes Good home lighting?: yes Regular seat belt use?: yes Hospital stays in the last year:: no  Cognitive Assessment Difficulty concentrating, remembering, or making decisions? : yes (concentrating) Will 6CIT or Mini Cog be Completed: yes What year is it?: 0 points What month is it?: 0 points Give patient an address phrase to remember (5 components): [address] [place] About what time is it?: 0 points Count backwards from 20 to 1: 0 points Say the months of the year in reverse: 0 points Repeat the address phrase from earlier: 0 points 6 CIT Score: 0 points  Advance Directives (For Healthcare) Does Patient Have a Medical Advance Directive?: No Would patient like information on creating a medical advance directive?: No - Patient declined  Reviewed/Updated  Reviewed/Updated: Reviewed All (Medical, Surgical, Family, Medications, Allergies, Care Teams, Patient Goals); Medical History; Surgical History; Family History; Medications; Allergies; Care Teams; Patient Goals    Allergies (verified) Hydrochlorothiazide, Ace inhibitors, Augmentin  [amoxicillin -pot clavulanate], and Celecoxib   Current Medications (verified) Outpatient Encounter Medications as of 09/21/2024  Medication Sig   acyclovir  ointment (ZOVIRAX ) 5 % Apply 1 application topically every 3 (three) hours.   Ascorbic Acid (VITAMIN C) 100 MG tablet Take  100 mg by mouth daily.   aspirin  EC  81 MG tablet Take 1 tablet (81 mg total) by mouth daily.   atorvastatin  (LIPITOR) 20 MG tablet Take 1 tablet (20 mg total) by mouth daily.   Cholecalciferol (VITAMIN D ) 2000 UNITS CAPS Take 1,000 Units by mouth 2 (two) times daily. gummie chewable   docusate sodium (COLACE) 100 MG capsule Take 100 mg by mouth daily.   glucose blood (ACCU-CHEK GUIDE) test strip Check BS up to 4 times daily Dx E11.9   losartan  (COZAAR ) 50 MG tablet Take 2 tablets (100 mg total) by mouth daily.   meclizine  (ANTIVERT ) 25 MG tablet Take 1 tablet (25 mg total) by mouth 3 (three) times daily as needed for dizziness.   metoprolol  succinate (TOPROL -XL) 100 MG 24 hr tablet Take 1 tablet (100 mg total) by mouth 2 (two) times daily. Take with or immediately following a meal.   ondansetron  (ZOFRAN ) 4 MG tablet Take 1 tablet (4 mg total) by mouth every 8 (eight) hours as needed for nausea or vomiting.   oxybutynin  (DITROPAN -XL) 10 MG 24 hr tablet TAKE ONE TABLET BY MOUTH ONCE DAILY AT BEDTIME   sertraline  (ZOLOFT ) 100 MG tablet TAKE TWO TABLETS BY MOUTH EVERY DAY   Vitamin D , Ergocalciferol , (DRISDOL ) 1.25 MG (50000 UNIT) CAPS capsule Take 1 capsule (50,000 Units total) by mouth every 7 (seven) days.   zolpidem  (AMBIEN ) 10 MG tablet Take 1 tablet (10 mg total) by mouth at bedtime.   No facility-administered encounter medications on file as of 09/21/2024.    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  Tobacco Use   Smoking status: Never   Smokeless tobacco: Never  Vaping Use   Vaping status: Never Used  Substance and Sexual Activity   Alcohol use: No   Drug use: No   Sexual activity: Yes    Birth control/protection: Post-menopausal   Tobacco Counseling Counseling given: Yes  SDOH Screenings   Food Insecurity: No Food Insecurity (09/21/2024)  Housing: Unknown (09/21/2024)  Transportation Needs: No Transportation Needs (09/21/2024)  Utilities: Not At Risk (09/21/2024)  Alcohol Screen: Low Risk (05/05/2023)  Depression (PHQ2-9): Low Risk (09/21/2024)  Financial Resource Strain: Low Risk (05/05/2023)  Physical Activity: Insufficiently Active (09/21/2024)  Social Connections: Socially Integrated (09/21/2024)  Stress: No Stress Concern Present (09/21/2024)  Tobacco Use: Low Risk (09/21/2024)  Health Literacy: Adequate Health Literacy (09/21/2024)   See flowsheets for full screening details  Depression Screen PHQ 2 & 9 Depression Scale- Over the past 2 weeks, how often have you been bothered by any of the following problems? Little interest or pleasure in doing things: 0 Feeling down, depressed, or hopeless (PHQ Adolescent also includes...irritable): 0 PHQ-2 Total Score: 0 Trouble falling or staying asleep, or sleeping too much: 0 Feeling tired or having little energy: 0 Poor  appetite or overeating (PHQ Adolescent also includes...weight loss): 0 Feeling bad about yourself - or that you are a failure or have let yourself or your family down: 0 Trouble concentrating on things, such as reading the newspaper or watching television (PHQ Adolescent also includes...like school work): 0 Moving or speaking so slowly that other people could have noticed. Or the opposite - being so fidgety or restless that you have been moving around a lot more than usual: 0 Thoughts that you would be better off  dead, or of hurting yourself in some way: 0 PHQ-9 Total Score: 0 If you checked off any problems, how difficult have these problems made it for you to do your work, take care of things at home, or get along with other people?: Not difficult at all     Goals Addressed             This Visit's Progress    Prevent falls   On track    05/01/2022 - Keep walking, maintain independence             Objective:    Today's Vitals   09/21/24 1131  BP: (!) 149/78  Pulse: (!) 56  Weight: 211 lb (95.7 kg)  Height: 5' 7 (1.702 m)   Body mass index is 33.05 kg/m.  Hearing/Vision screen Vision Screening - Comments:: Last ov 2025/[facility] in [place] Immunizations and Health Maintenance Health Maintenance  Topic Date Due   OPHTHALMOLOGY EXAM  02/08/2023   HEMOGLOBIN A1C  11/04/2024   COVID-19 Vaccine (6 - Moderna risk 2025-26 season) 12/19/2024   Diabetic kidney evaluation - eGFR measurement  05/04/2025   Diabetic kidney evaluation - Urine ACR  05/04/2025   FOOT EXAM  05/04/2025   Medicare Annual Wellness (AWV)  09/21/2025   Bone Density Scan  11/12/2025   DTaP/Tdap/Td (4 - Td or Tdap) 04/28/2031   Pneumococcal Vaccine: 50+ Years  Completed   Influenza Vaccine  Completed   Zoster Vaccines- Shingrix   Completed   Meningococcal B Vaccine  Aged Out   Hepatitis B Vaccines 19-59 Average Risk  Discontinued   Mammogram  Discontinued   Hepatitis C Screening  Discontinued        Assessment/Plan:  This is a routine wellness examination for Jeanette Daniels.  Patient Care Team: Lavell Bari LABOR, FNP as PCP - General (Nurse Practitioner)  I have personally reviewed and noted the following in the patients chart:   Medical and social history Use of alcohol, tobacco or illicit drugs  Current medications and supplements including opioid prescriptions. Functional ability and status Nutritional status Physical activity Advanced directives List of other physicians Hospitalizations, surgeries,  and ER visits in previous 12 months Vitals Screenings to include cognitive, depression, and falls Referrals and appointments  No orders of the defined types were placed in this encounter.  In addition, I have reviewed and discussed with patient certain preventive protocols, quality metrics, and best practice recommendations. A written personalized care plan for preventive services as well as general preventive health recommendations were provided to patient.   Ozie Ned, CMA   09/21/2024   Return in 1 year (on 09/21/2025).  After Visit Summary: (MyChart) Due to this being a telephonic visit, the after visit summary with patients personalized plan was offered to patient via MyChart   Nurse Notes: Pt is aware and due an eye exam.  "

## 2024-09-21 NOTE — Patient Instructions (Signed)
 Jeanette Daniels,  Thank you for taking the time for your Medicare Wellness Visit. I appreciate your continued commitment to your health goals. Please review the care plan we discussed, and feel free to reach out if I can assist you further.  Please note that Annual Wellness Visits do not include a physical exam. Some assessments may be limited, especially if the visit was conducted virtually. If needed, we may recommend an in-person follow-up with your provider.  Ongoing Care Seeing your primary care provider every 3 to 6 months helps us  monitor your health and provide consistent, personalized care.   Referrals If a referral was made during today's visit and you haven't received any updates within two weeks, please contact the referred provider directly to check on the status.  Recommended Screenings:  Health Maintenance  Topic Date Due   Eye exam for diabetics  02/08/2023   Medicare Annual Wellness Visit  05/04/2024   COVID-19 Vaccine (4 - 2025-26 season) 05/17/2024   Hemoglobin A1C  11/04/2024   Yearly kidney function blood test for diabetes  05/04/2025   Yearly kidney health urinalysis for diabetes  05/04/2025   Complete foot exam   05/04/2025   Osteoporosis screening with Bone Density Scan  11/12/2025   DTaP/Tdap/Td vaccine (4 - Td or Tdap) 04/28/2031   Pneumococcal Vaccine for age over 53  Completed   Flu Shot  Completed   Zoster (Shingles) Vaccine  Completed   Meningitis B Vaccine  Aged Out   Hepatitis B Vaccine  Discontinued   Breast Cancer Screening  Discontinued   Hepatitis C Screening  Discontinued       09/21/2024   11:34 AM  Advanced Directives  Does Patient Have a Medical Advance Directive? No  Would patient like information on creating a medical advance directive? No - Patient declined    Vision: Annual vision screenings are recommended for early detection of glaucoma, cataracts, and diabetic retinopathy. These exams can also reveal signs of chronic conditions such as  diabetes and high blood pressure.  Dental: Annual dental screenings help detect early signs of oral cancer, gum disease, and other conditions linked to overall health, including heart disease and diabetes.  Please see the attached documents for additional preventive care recommendations.

## 2024-10-05 ENCOUNTER — Other Ambulatory Visit: Payer: Self-pay | Admitting: Family

## 2024-10-05 DIAGNOSIS — E1159 Type 2 diabetes mellitus with other circulatory complications: Secondary | ICD-10-CM

## 2024-11-04 ENCOUNTER — Ambulatory Visit: Payer: Self-pay | Admitting: Family
